# Patient Record
Sex: Male | Born: 1945 | Race: White | Hispanic: No | Marital: Married | Smoking: Never smoker
Health system: Southern US, Community
[De-identification: ages and names within clinical notes are randomized; demographics above are authoritative.]

## PROBLEM LIST (undated history)

## (undated) ENCOUNTER — Encounter

## (undated) ENCOUNTER — Ambulatory Visit: Payer: MEDICARE

## (undated) ENCOUNTER — Ambulatory Visit
Payer: MEDICARE | Attending: Student in an Organized Health Care Education/Training Program | Primary: Student in an Organized Health Care Education/Training Program

## (undated) ENCOUNTER — Telehealth

## (undated) ENCOUNTER — Ambulatory Visit

## (undated) ENCOUNTER — Encounter: Attending: Nephrology | Primary: Nephrology

## (undated) ENCOUNTER — Encounter
Attending: Student in an Organized Health Care Education/Training Program | Primary: Student in an Organized Health Care Education/Training Program

## (undated) ENCOUNTER — Ambulatory Visit: Payer: MEDICARE | Attending: Internal Medicine | Primary: Internal Medicine

## (undated) ENCOUNTER — Telehealth: Attending: Family Medicine | Primary: Family Medicine

## (undated) ENCOUNTER — Encounter: Attending: Family | Primary: Family

## (undated) ENCOUNTER — Ambulatory Visit: Payer: MEDICARE | Attending: Nephrology | Primary: Nephrology

## (undated) ENCOUNTER — Encounter: Attending: Diagnostic Radiology | Primary: Diagnostic Radiology

## (undated) ENCOUNTER — Telehealth
Attending: Student in an Organized Health Care Education/Training Program | Primary: Student in an Organized Health Care Education/Training Program

## (undated) ENCOUNTER — Telehealth: Attending: Family | Primary: Family

## (undated) DIAGNOSIS — M109 Gout, unspecified: Secondary | ICD-10-CM

## (undated) DIAGNOSIS — K7210 Chronic hepatic failure without coma: Secondary | ICD-10-CM

## (undated) DIAGNOSIS — Z9289 Personal history of other medical treatment: Secondary | ICD-10-CM

## (undated) DIAGNOSIS — M545 Low back pain, unspecified: Secondary | ICD-10-CM

## (undated) DIAGNOSIS — Z87442 Personal history of urinary calculi: Secondary | ICD-10-CM

## (undated) DIAGNOSIS — Z944 Liver transplant status: Secondary | ICD-10-CM

## (undated) DIAGNOSIS — E669 Obesity, unspecified: Secondary | ICD-10-CM

## (undated) DIAGNOSIS — I4891 Unspecified atrial fibrillation: Secondary | ICD-10-CM

## (undated) DIAGNOSIS — K7581 Nonalcoholic steatohepatitis (NASH): Secondary | ICD-10-CM

## (undated) DIAGNOSIS — G8929 Other chronic pain: Secondary | ICD-10-CM

## (undated) DIAGNOSIS — Z940 Kidney transplant status: Secondary | ICD-10-CM

## (undated) DIAGNOSIS — M1990 Unspecified osteoarthritis, unspecified site: Secondary | ICD-10-CM

## (undated) DIAGNOSIS — K7290 Hepatic failure, unspecified without coma: Secondary | ICD-10-CM

## (undated) DIAGNOSIS — N02B9 Other recurrent and persistent immunoglobulin A nephropathy: Secondary | ICD-10-CM

## (undated) DIAGNOSIS — G709 Myoneural disorder, unspecified: Secondary | ICD-10-CM

## (undated) DIAGNOSIS — N028 Recurrent and persistent hematuria with other morphologic changes: Secondary | ICD-10-CM

## (undated) DIAGNOSIS — D126 Benign neoplasm of colon, unspecified: Secondary | ICD-10-CM

## (undated) DIAGNOSIS — N186 End stage renal disease: Secondary | ICD-10-CM

## (undated) HISTORY — PX: AV FISTULA PLACEMENT: SHX1204

## (undated) HISTORY — DX: Chronic hepatic failure without coma: K72.10

## (undated) HISTORY — PX: COLONOSCOPY: SHX174

## (undated) HISTORY — PX: UMBILICAL HERNIA REPAIR: SHX196

## (undated) HISTORY — DX: Other recurrent and persistent immunoglobulin A nephropathy: N02.B9

## (undated) HISTORY — PX: REFRACTIVE SURGERY: SHX103

## (undated) HISTORY — PX: LIGATION OF ARTERIOVENOUS  FISTULA: SHX5948

## (undated) HISTORY — PX: BACK SURGERY: SHX140

## (undated) HISTORY — DX: Nonalcoholic steatohepatitis (NASH): K75.81

## (undated) HISTORY — PX: LASIK: SHX215

## (undated) HISTORY — DX: Recurrent and persistent hematuria with other morphologic changes: N02.8

## (undated) HISTORY — PX: HERNIA REPAIR: SHX51

## (undated) HISTORY — PX: OTHER SURGICAL HISTORY: SHX169

## (undated) HISTORY — PX: CARPAL TUNNEL RELEASE: SHX101

---
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
## 2011-02-26 ENCOUNTER — Ambulatory Visit (INDEPENDENT_AMBULATORY_CARE_PROVIDER_SITE_OTHER): Payer: BC Managed Care – PPO | Admitting: Gastroenterology

## 2011-02-26 DIAGNOSIS — K7460 Unspecified cirrhosis of liver: Secondary | ICD-10-CM

## 2011-02-26 DIAGNOSIS — K766 Portal hypertension: Secondary | ICD-10-CM

## 2011-02-26 DIAGNOSIS — R188 Other ascites: Secondary | ICD-10-CM

## 2011-07-30 ENCOUNTER — Ambulatory Visit (INDEPENDENT_AMBULATORY_CARE_PROVIDER_SITE_OTHER): Payer: BC Managed Care – PPO | Admitting: Gastroenterology

## 2011-07-30 DIAGNOSIS — K766 Portal hypertension: Secondary | ICD-10-CM

## 2011-07-30 DIAGNOSIS — K7689 Other specified diseases of liver: Secondary | ICD-10-CM

## 2011-07-30 DIAGNOSIS — R945 Abnormal results of liver function studies: Secondary | ICD-10-CM

## 2011-08-21 ENCOUNTER — Other Ambulatory Visit: Payer: Self-pay | Admitting: Gastroenterology

## 2011-08-21 DIAGNOSIS — N179 Acute kidney failure, unspecified: Secondary | ICD-10-CM

## 2011-08-21 DIAGNOSIS — K7460 Unspecified cirrhosis of liver: Secondary | ICD-10-CM

## 2011-08-24 ENCOUNTER — Ambulatory Visit (HOSPITAL_COMMUNITY)
Admission: RE | Admit: 2011-08-24 | Discharge: 2011-08-24 | Disposition: A | Discharge status: Home / Self Care | Payer: BC Managed Care – PPO | Source: Ambulatory Visit | Attending: Gastroenterology | Admitting: Gastroenterology

## 2011-08-24 DIAGNOSIS — N179 Acute kidney failure, unspecified: Secondary | ICD-10-CM

## 2011-08-24 DIAGNOSIS — R188 Other ascites: Secondary | ICD-10-CM | POA: Insufficient documentation

## 2011-08-24 DIAGNOSIS — K7460 Unspecified cirrhosis of liver: Secondary | ICD-10-CM

## 2011-08-24 MED ORDER — ALBUMIN HUMAN 25 % IV SOLN
50.0000 g | Freq: Once | INTRAVENOUS | Status: AC
  Administered 2011-08-24: 50 g via INTRAVENOUS
  Filled 2011-08-24: qty 200

## 2011-08-24 NOTE — Procedures (Signed)
US guided LLQ  Paracentesis  6 Liters yellow fluid removed  Pt tolerated well BP: stable  Post procedure IV albumin per MD Pt to [facility] now for albumin

## 2011-08-27 ENCOUNTER — Telehealth (HOSPITAL_COMMUNITY): Payer: Self-pay

## 2011-09-04 ENCOUNTER — Ambulatory Visit (HOSPITAL_COMMUNITY)
Admission: AD | Admit: 2011-09-04 | Discharge: 2011-09-04 | Disposition: A | Discharge status: Home / Self Care | Payer: BC Managed Care – PPO | Source: Ambulatory Visit | Attending: Psychiatry | Admitting: Psychiatry

## 2011-09-25 ENCOUNTER — Other Ambulatory Visit: Payer: Self-pay | Admitting: Gastroenterology

## 2011-09-25 DIAGNOSIS — K7460 Unspecified cirrhosis of liver: Secondary | ICD-10-CM

## 2011-09-26 ENCOUNTER — Ambulatory Visit (HOSPITAL_COMMUNITY)
Admission: RE | Admit: 2011-09-26 | Discharge: 2011-09-26 | Disposition: A | Discharge status: Home / Self Care | Payer: Medicare Other | Source: Ambulatory Visit | Attending: Gastroenterology | Admitting: Gastroenterology

## 2011-09-26 VITALS — BP 110/65

## 2011-09-26 DIAGNOSIS — K7460 Unspecified cirrhosis of liver: Secondary | ICD-10-CM

## 2011-09-26 DIAGNOSIS — R188 Other ascites: Secondary | ICD-10-CM | POA: Insufficient documentation

## 2011-09-26 MED ORDER — ALBUMIN HUMAN 25 % IV SOLN
25.0000 g | Freq: Once | INTRAVENOUS | Status: AC
  Administered 2011-09-26: 25 g via INTRAVENOUS
  Filled 2011-09-26: qty 100

## 2011-09-26 NOTE — Procedures (Signed)
US guided diagnostic and therapeutic paracentesis performed yielding 5 liters yellow fluid. A portion of the fluid was sent for culture and sensitivity. No immediate complications.

## 2011-09-30 ENCOUNTER — Telehealth (HOSPITAL_COMMUNITY): Payer: Self-pay | Admitting: Radiology

## 2011-09-30 LAB — BODY FLUID CULTURE

## 2011-10-01 ENCOUNTER — Telehealth (HOSPITAL_COMMUNITY): Payer: Self-pay

## 2011-10-11 ENCOUNTER — Other Ambulatory Visit: Payer: Self-pay | Admitting: Gastroenterology

## 2011-10-11 DIAGNOSIS — K7460 Unspecified cirrhosis of liver: Secondary | ICD-10-CM

## 2011-10-17 ENCOUNTER — Ambulatory Visit (HOSPITAL_COMMUNITY)
Admission: RE | Admit: 2011-10-17 | Discharge: 2011-10-17 | Disposition: A | Discharge status: Home / Self Care | Payer: Medicare Other | Source: Ambulatory Visit | Attending: Gastroenterology | Admitting: Gastroenterology

## 2011-10-17 DIAGNOSIS — R188 Other ascites: Secondary | ICD-10-CM | POA: Insufficient documentation

## 2011-10-17 DIAGNOSIS — K7460 Unspecified cirrhosis of liver: Secondary | ICD-10-CM

## 2011-10-17 MED ORDER — ALBUMIN HUMAN 25 % IV SOLN
50.0000 g | Freq: Once | INTRAVENOUS | Status: AC
  Administered 2011-10-17: 50 g via INTRAVENOUS
  Filled 2011-10-17: qty 200

## 2011-10-17 NOTE — Procedures (Signed)
  US guided RLQ paracentesis  5 liters removed: yellow 60 cc sent to lab  BP stable Pt tolerated well

## 2011-10-21 LAB — BODY FLUID CULTURE: Culture: NO GROWTH

## 2011-10-22 ENCOUNTER — Ambulatory Visit (INDEPENDENT_AMBULATORY_CARE_PROVIDER_SITE_OTHER): Payer: Medicare Other | Admitting: Gastroenterology

## 2011-10-22 DIAGNOSIS — R945 Abnormal results of liver function studies: Secondary | ICD-10-CM

## 2011-10-22 DIAGNOSIS — K766 Portal hypertension: Secondary | ICD-10-CM

## 2011-10-22 DIAGNOSIS — K7460 Unspecified cirrhosis of liver: Secondary | ICD-10-CM

## 2011-10-22 DIAGNOSIS — R188 Other ascites: Secondary | ICD-10-CM

## 2011-11-07 ENCOUNTER — Other Ambulatory Visit: Payer: Self-pay | Admitting: Gastroenterology

## 2011-11-07 DIAGNOSIS — K7290 Hepatic failure, unspecified without coma: Secondary | ICD-10-CM

## 2011-11-12 ENCOUNTER — Inpatient Hospital Stay (HOSPITAL_COMMUNITY)
Admission: RE | Admit: 2011-11-12 | Discharge: 2011-11-12 | Payer: Self-pay | Source: Ambulatory Visit | Attending: Radiology | Admitting: Radiology

## 2011-11-12 ENCOUNTER — Ambulatory Visit (HOSPITAL_COMMUNITY)
Admission: RE | Admit: 2011-11-12 | Discharge: 2011-11-12 | Disposition: A | Discharge status: Home / Self Care | Payer: Medicare Other | Source: Ambulatory Visit | Attending: Gastroenterology | Admitting: Gastroenterology

## 2011-11-12 VITALS — BP 112/56

## 2011-11-12 DIAGNOSIS — K7290 Hepatic failure, unspecified without coma: Secondary | ICD-10-CM

## 2011-11-12 DIAGNOSIS — K7460 Unspecified cirrhosis of liver: Secondary | ICD-10-CM | POA: Insufficient documentation

## 2011-11-12 DIAGNOSIS — R188 Other ascites: Secondary | ICD-10-CM | POA: Insufficient documentation

## 2011-11-12 MED ORDER — ALBUMIN HUMAN 25 % IV SOLN
50.0000 g | Freq: Once | INTRAVENOUS | Status: AC
  Administered 2011-11-12: 50 g via INTRAVENOUS
  Filled 2011-11-12: qty 200

## 2011-11-12 NOTE — Procedures (Signed)
RLQ US guided paracentesis  6 liters yellow fluid removed Pt tolerated well BP stable: 115/56  IV albumin post procedure per MD

## 2011-12-12 ENCOUNTER — Other Ambulatory Visit: Payer: Self-pay | Admitting: Gastroenterology

## 2011-12-12 DIAGNOSIS — R188 Other ascites: Secondary | ICD-10-CM

## 2011-12-12 DIAGNOSIS — K7210 Chronic hepatic failure without coma: Secondary | ICD-10-CM

## 2011-12-12 DIAGNOSIS — N186 End stage renal disease: Secondary | ICD-10-CM

## 2011-12-12 DIAGNOSIS — K7290 Hepatic failure, unspecified without coma: Secondary | ICD-10-CM

## 2011-12-17 ENCOUNTER — Ambulatory Visit (HOSPITAL_COMMUNITY)
Admission: RE | Admit: 2011-12-17 | Discharge: 2011-12-17 | Disposition: A | Discharge status: Home / Self Care | Payer: Medicare Other | Source: Ambulatory Visit | Attending: Gastroenterology | Admitting: Gastroenterology

## 2011-12-17 DIAGNOSIS — K7290 Hepatic failure, unspecified without coma: Secondary | ICD-10-CM

## 2011-12-17 DIAGNOSIS — N186 End stage renal disease: Secondary | ICD-10-CM | POA: Insufficient documentation

## 2011-12-17 DIAGNOSIS — R188 Other ascites: Secondary | ICD-10-CM | POA: Insufficient documentation

## 2011-12-17 DIAGNOSIS — K769 Liver disease, unspecified: Secondary | ICD-10-CM | POA: Insufficient documentation

## 2011-12-17 MED ORDER — ALBUMIN HUMAN 25 % IV SOLN
50.0000 g | Freq: Once | INTRAVENOUS | Status: AC
  Administered 2011-12-17: 50 g via INTRAVENOUS
  Filled 2011-12-17: qty 200

## 2011-12-17 NOTE — Procedures (Signed)
Procedure : large volume paracentesis Specimen :  7.6  L amber colored fluid Complications : none immediate  Patient tolerated well.  Patient sent for albumin infusion post procedure .

## 2012-01-10 ENCOUNTER — Other Ambulatory Visit: Payer: Self-pay | Admitting: Gastroenterology

## 2012-01-10 DIAGNOSIS — K7460 Unspecified cirrhosis of liver: Secondary | ICD-10-CM

## 2012-01-15 ENCOUNTER — Other Ambulatory Visit (HOSPITAL_COMMUNITY): Payer: Self-pay

## 2012-01-15 ENCOUNTER — Ambulatory Visit (HOSPITAL_COMMUNITY)
Admission: RE | Admit: 2012-01-15 | Discharge: 2012-01-15 | Disposition: A | Discharge status: Home / Self Care | Payer: Medicare Other | Source: Ambulatory Visit | Attending: Gastroenterology | Admitting: Gastroenterology

## 2012-01-15 DIAGNOSIS — K7460 Unspecified cirrhosis of liver: Secondary | ICD-10-CM | POA: Insufficient documentation

## 2012-01-15 DIAGNOSIS — K8020 Calculus of gallbladder without cholecystitis without obstruction: Secondary | ICD-10-CM | POA: Insufficient documentation

## 2012-01-15 DIAGNOSIS — R188 Other ascites: Secondary | ICD-10-CM | POA: Insufficient documentation

## 2012-01-15 DIAGNOSIS — R161 Splenomegaly, not elsewhere classified: Secondary | ICD-10-CM | POA: Insufficient documentation

## 2012-01-16 ENCOUNTER — Other Ambulatory Visit: Payer: Self-pay | Admitting: Gastroenterology

## 2012-01-16 DIAGNOSIS — K7290 Hepatic failure, unspecified without coma: Secondary | ICD-10-CM

## 2012-01-16 DIAGNOSIS — K7210 Chronic hepatic failure without coma: Secondary | ICD-10-CM

## 2012-01-16 DIAGNOSIS — N186 End stage renal disease: Secondary | ICD-10-CM

## 2012-01-21 ENCOUNTER — Ambulatory Visit (HOSPITAL_COMMUNITY)
Admission: RE | Admit: 2012-01-21 | Discharge: 2012-01-21 | Disposition: A | Discharge status: Home / Self Care | Payer: Medicare Other | Source: Ambulatory Visit | Attending: Gastroenterology | Admitting: Gastroenterology

## 2012-01-21 DIAGNOSIS — R188 Other ascites: Secondary | ICD-10-CM | POA: Insufficient documentation

## 2012-01-21 DIAGNOSIS — K7210 Chronic hepatic failure without coma: Secondary | ICD-10-CM

## 2012-01-21 DIAGNOSIS — K7290 Hepatic failure, unspecified without coma: Secondary | ICD-10-CM

## 2012-01-21 DIAGNOSIS — N186 End stage renal disease: Secondary | ICD-10-CM

## 2012-01-21 MED ORDER — ALBUMIN HUMAN 25 % IV SOLN
75.0000 g | Freq: Once | INTRAVENOUS | Status: AC
  Administered 2012-01-21: 75 g via INTRAVENOUS
  Filled 2012-01-21: qty 300

## 2012-02-18 ENCOUNTER — Other Ambulatory Visit: Payer: Self-pay | Admitting: Gastroenterology

## 2012-02-18 DIAGNOSIS — K769 Liver disease, unspecified: Secondary | ICD-10-CM

## 2012-02-20 ENCOUNTER — Ambulatory Visit (HOSPITAL_COMMUNITY)
Admission: RE | Admit: 2012-02-20 | Discharge: 2012-02-20 | Disposition: A | Discharge status: Home / Self Care | Payer: Medicare Other | Source: Ambulatory Visit | Attending: Gastroenterology | Admitting: Gastroenterology

## 2012-02-20 DIAGNOSIS — K7460 Unspecified cirrhosis of liver: Secondary | ICD-10-CM | POA: Insufficient documentation

## 2012-02-20 DIAGNOSIS — R188 Other ascites: Secondary | ICD-10-CM | POA: Insufficient documentation

## 2012-02-20 DIAGNOSIS — K769 Liver disease, unspecified: Secondary | ICD-10-CM

## 2012-02-20 MED ORDER — ALBUMIN HUMAN 25 % IV SOLN
25.0000 g | Freq: Once | INTRAVENOUS | Status: DC
  Administered 2012-02-20: 25 g via INTRAVENOUS
  Filled 2012-02-20 (×2): qty 100

## 2012-02-20 MED ORDER — ALBUMIN HUMAN 25 % IV SOLN
25.0000 g | INTRAVENOUS | Status: AC
  Administered 2012-02-20 (×2): 25 g via INTRAVENOUS
  Filled 2012-02-20: qty 100

## 2012-02-20 NOTE — Procedures (Signed)
US guided therapeutic paracentesis performed yielding 10 liters yellow fluid. No immediate complications. The pt will receive IV albumin postprocedure.

## 2012-03-10 ENCOUNTER — Other Ambulatory Visit: Payer: Self-pay | Admitting: Gastroenterology

## 2012-03-10 DIAGNOSIS — R188 Other ascites: Secondary | ICD-10-CM

## 2012-03-17 ENCOUNTER — Ambulatory Visit (HOSPITAL_COMMUNITY)
Admission: RE | Admit: 2012-03-17 | Discharge: 2012-03-17 | Disposition: A | Discharge status: Home / Self Care | Payer: Medicare Other | Source: Ambulatory Visit | Attending: Gastroenterology | Admitting: Gastroenterology

## 2012-03-17 DIAGNOSIS — R188 Other ascites: Secondary | ICD-10-CM | POA: Insufficient documentation

## 2012-03-17 MED ORDER — ALBUMIN HUMAN 25 % IV SOLN
75.0000 g | Freq: Once | INTRAVENOUS | Status: AC
  Administered 2012-03-17: 75 g via INTRAVENOUS
  Filled 2012-03-17: qty 300

## 2012-03-17 NOTE — Procedures (Signed)
RLQ US guided paracentesis 10.6 liters obtained Pt tolerated well BP stable: 105/59  IV albumin post procedure per MD 25 gr of 25% Albumin/ 3 liters drawn = 75 gr IV Albumin Pt to [facility] for albumin now.

## 2012-04-07 ENCOUNTER — Other Ambulatory Visit: Payer: Self-pay | Admitting: Gastroenterology

## 2012-04-07 DIAGNOSIS — R188 Other ascites: Secondary | ICD-10-CM

## 2012-04-11 ENCOUNTER — Ambulatory Visit (HOSPITAL_COMMUNITY)
Admission: RE | Admit: 2012-04-11 | Discharge: 2012-04-11 | Disposition: A | Discharge status: Home / Self Care | Payer: Medicare Other | Source: Ambulatory Visit | Attending: Gastroenterology | Admitting: Gastroenterology

## 2012-04-11 DIAGNOSIS — R188 Other ascites: Secondary | ICD-10-CM | POA: Insufficient documentation

## 2012-04-11 MED ORDER — ALBUMIN HUMAN 25 % IV SOLN
100.0000 g | Freq: Once | INTRAVENOUS | Status: AC
  Administered 2012-04-11: 100 g via INTRAVENOUS
  Filled 2012-04-11: qty 400

## 2012-04-11 NOTE — Procedures (Addendum)
Procedure : large volume paracentesis Specimen : 13.3   L yellow serous fluid Complications : none immediate  Patient to [facility] for IV albumin post procedure as ordered. Full report in canopy.

## 2012-04-25 ENCOUNTER — Other Ambulatory Visit: Payer: Self-pay | Admitting: Gastroenterology

## 2012-04-25 DIAGNOSIS — R188 Other ascites: Secondary | ICD-10-CM

## 2012-04-25 DIAGNOSIS — K7290 Hepatic failure, unspecified without coma: Secondary | ICD-10-CM

## 2012-04-28 ENCOUNTER — Ambulatory Visit (HOSPITAL_COMMUNITY)
Admission: RE | Admit: 2012-04-28 | Discharge: 2012-04-28 | Disposition: A | Discharge status: Home / Self Care | Payer: Medicare Other | Source: Ambulatory Visit | Attending: Gastroenterology | Admitting: Gastroenterology

## 2012-04-28 DIAGNOSIS — K7290 Hepatic failure, unspecified without coma: Secondary | ICD-10-CM

## 2012-04-28 DIAGNOSIS — R188 Other ascites: Secondary | ICD-10-CM | POA: Insufficient documentation

## 2012-04-28 MED ORDER — ALBUMIN HUMAN 25 % IV SOLN
75.0000 g | Freq: Once | INTRAVENOUS | Status: AC
  Administered 2012-04-28: 75 g via INTRAVENOUS
  Filled 2012-04-28 (×2): qty 300

## 2012-04-28 NOTE — Procedures (Addendum)
Successful US guided paracentesis from RLQ.  Yielded 11.5L of clear yellow fluid.  No immediate complications.  Pt tolerated well.   Specimen was not sent for labs. Pt to go to [facility] for IV albumin Based on volume, will give 75gr IV albumin today  Brayton El PA-C 04/28/2012 12:52 PM

## 2012-05-12 ENCOUNTER — Other Ambulatory Visit: Payer: Self-pay | Admitting: Gastroenterology

## 2012-05-12 DIAGNOSIS — K7460 Unspecified cirrhosis of liver: Secondary | ICD-10-CM

## 2012-05-19 ENCOUNTER — Ambulatory Visit (HOSPITAL_COMMUNITY)
Admission: RE | Admit: 2012-05-19 | Discharge: 2012-05-19 | Disposition: A | Discharge status: Home / Self Care | Payer: Medicare Other | Source: Ambulatory Visit | Attending: Gastroenterology | Admitting: Gastroenterology

## 2012-05-19 DIAGNOSIS — K7460 Unspecified cirrhosis of liver: Secondary | ICD-10-CM | POA: Insufficient documentation

## 2012-05-19 MED ORDER — ALBUMIN HUMAN 25 % IV SOLN
100.0000 g | Freq: Once | INTRAVENOUS | Status: AC
  Administered 2012-05-19: 100 g via INTRAVENOUS
  Filled 2012-05-19 (×3): qty 400

## 2012-05-19 NOTE — Procedures (Signed)
Successful US guided paracentesis from RLQ.  Yielded 11.5L of clear yellow fluid.  No immediate complications.  Pt tolerated well.   Specimen was not sent for labs.  Brayton El PA-C 05/19/2012 12:07 PM

## 2012-06-09 ENCOUNTER — Other Ambulatory Visit: Payer: Self-pay | Admitting: Gastroenterology

## 2012-06-09 DIAGNOSIS — K7460 Unspecified cirrhosis of liver: Secondary | ICD-10-CM

## 2012-06-09 DIAGNOSIS — R188 Other ascites: Secondary | ICD-10-CM

## 2012-06-09 DIAGNOSIS — Z7682 Awaiting organ transplant status: Secondary | ICD-10-CM

## 2012-06-18 ENCOUNTER — Ambulatory Visit (HOSPITAL_COMMUNITY)
Admission: RE | Admit: 2012-06-18 | Discharge: 2012-06-18 | Disposition: A | Discharge status: Home / Self Care | Payer: Medicare Other | Source: Ambulatory Visit | Attending: Gastroenterology | Admitting: Gastroenterology

## 2012-06-18 ENCOUNTER — Other Ambulatory Visit (HOSPITAL_COMMUNITY): Payer: Self-pay

## 2012-06-18 DIAGNOSIS — R188 Other ascites: Secondary | ICD-10-CM | POA: Insufficient documentation

## 2012-06-18 DIAGNOSIS — Z7682 Awaiting organ transplant status: Secondary | ICD-10-CM | POA: Insufficient documentation

## 2012-06-18 DIAGNOSIS — K7460 Unspecified cirrhosis of liver: Secondary | ICD-10-CM | POA: Insufficient documentation

## 2012-06-18 MED ORDER — ALBUMIN HUMAN 25 % IV SOLN
80.0000 g | Freq: Once | INTRAVENOUS | Status: AC
  Administered 2012-06-18: 80 g via INTRAVENOUS
  Filled 2012-06-18: qty 350

## 2012-06-18 NOTE — Procedures (Signed)
Successful US guided paracentesis from RLQ.  Yielded 10.2L of clear yellow fluid.  No immediate complications.  Pt tolerated well.   Specimen was not sent for labs.  Brayton El PA-C 06/18/2012 10:58 AM

## 2012-07-02 DIAGNOSIS — N186 End stage renal disease: Secondary | ICD-10-CM

## 2012-07-02 HISTORY — PX: LIVER TRANSPLANT: SHX410

## 2012-07-02 HISTORY — PX: CHOLECYSTECTOMY OPEN: SUR202

## 2012-07-02 HISTORY — DX: End stage renal disease: N18.6

## 2012-07-02 HISTORY — PX: NEPHRECTOMY RECIPIENT: SUR879

## 2012-07-10 ENCOUNTER — Other Ambulatory Visit: Payer: Self-pay | Admitting: Gastroenterology

## 2012-07-10 DIAGNOSIS — R188 Other ascites: Secondary | ICD-10-CM

## 2012-07-11 ENCOUNTER — Ambulatory Visit (HOSPITAL_COMMUNITY)
Admission: RE | Admit: 2012-07-11 | Discharge: 2012-07-11 | Disposition: A | Discharge status: Home / Self Care | Payer: Medicare Other | Source: Ambulatory Visit | Attending: Gastroenterology | Admitting: Gastroenterology

## 2012-07-11 DIAGNOSIS — R188 Other ascites: Secondary | ICD-10-CM

## 2012-07-11 MED ORDER — ALBUMIN HUMAN 25 % IV SOLN
60.0000 g | Freq: Once | INTRAVENOUS | Status: AC
  Administered 2012-07-11: 60 g via INTRAVENOUS
  Filled 2012-07-11: qty 250

## 2012-07-11 NOTE — Procedures (Signed)
Successful US guided paracentesis from RLQ.  Yielded 7.2L of clear yellow fluid.  No immediate complications.  Pt tolerated well.   Specimen was not sent for labs. Pt was sent to Short Stay for IV albumin as ordered  Brayton El PA-C 07/11/2012 11:31 AM

## 2012-10-21 DIAGNOSIS — N200 Calculus of kidney: Secondary | ICD-10-CM | POA: Insufficient documentation

## 2013-10-16 DIAGNOSIS — N186 End stage renal disease: Secondary | ICD-10-CM | POA: Insufficient documentation

## 2013-12-08 IMAGING — US US PARACENTESIS
1 series · 7 of 7 positions shown · non-contrast
Comparison: None

CLINICAL DATA: Abdominal ascites

ULTRASOUND GUIDED PARACENTESIS

[Series 1: us paracentesis · 0.31mm/px · 7 of 7 slices shown]
[im 1/7]
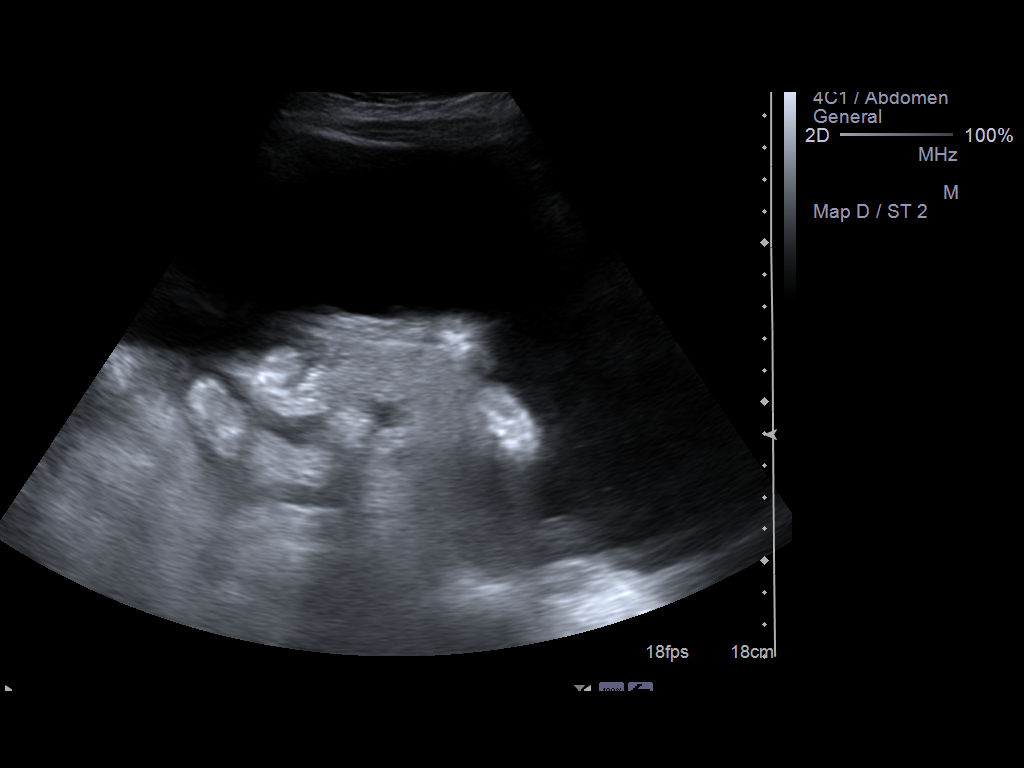
[im 2/7]
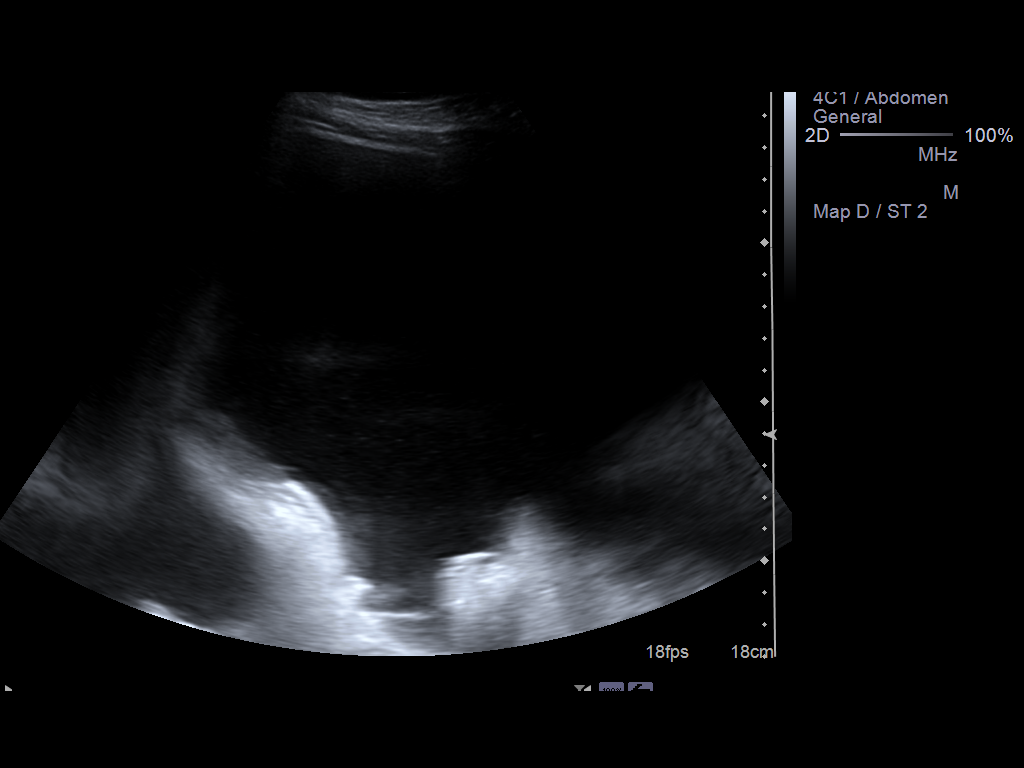
[im 3/7]
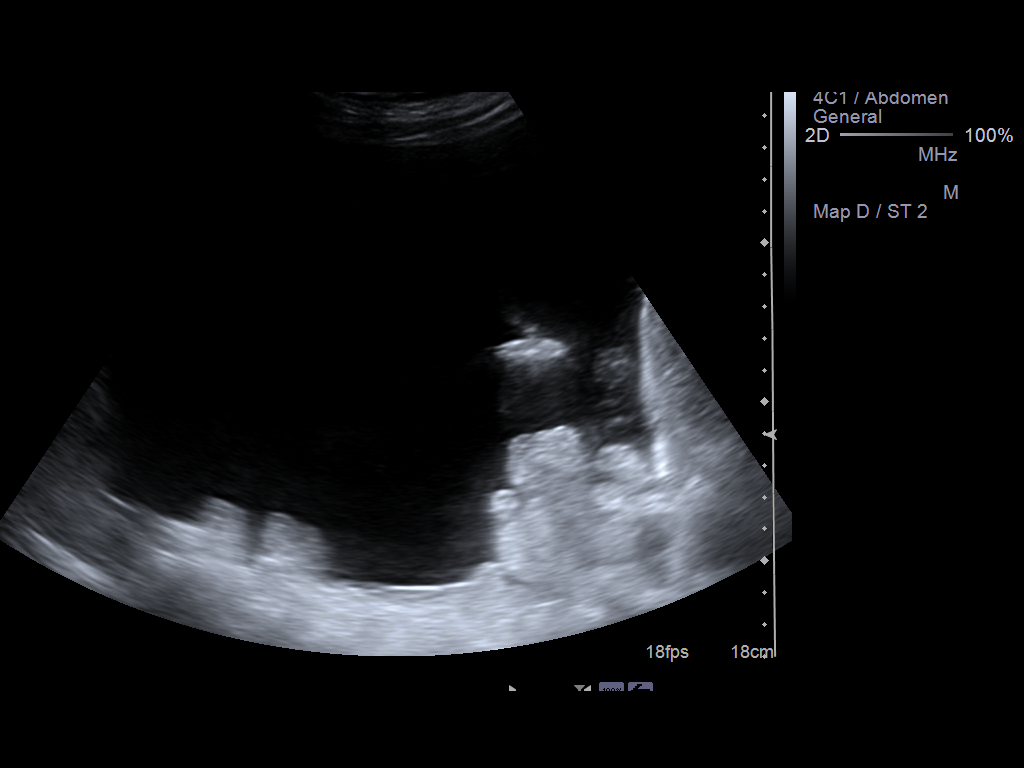
[im 4/7]
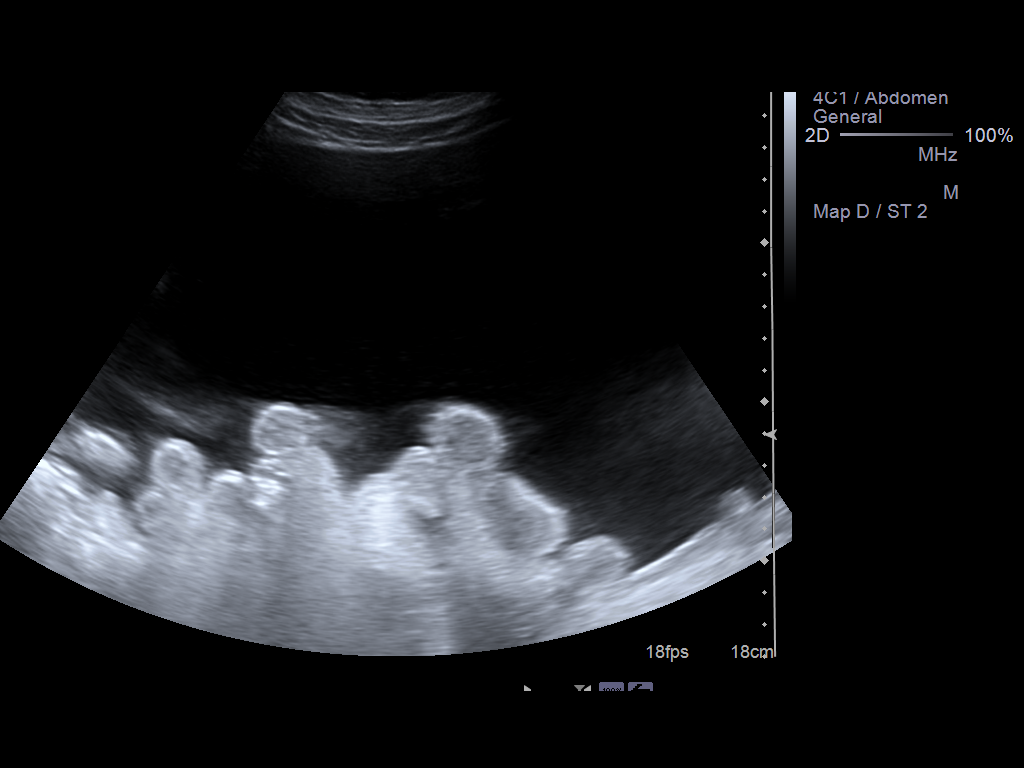
[im 5/7]
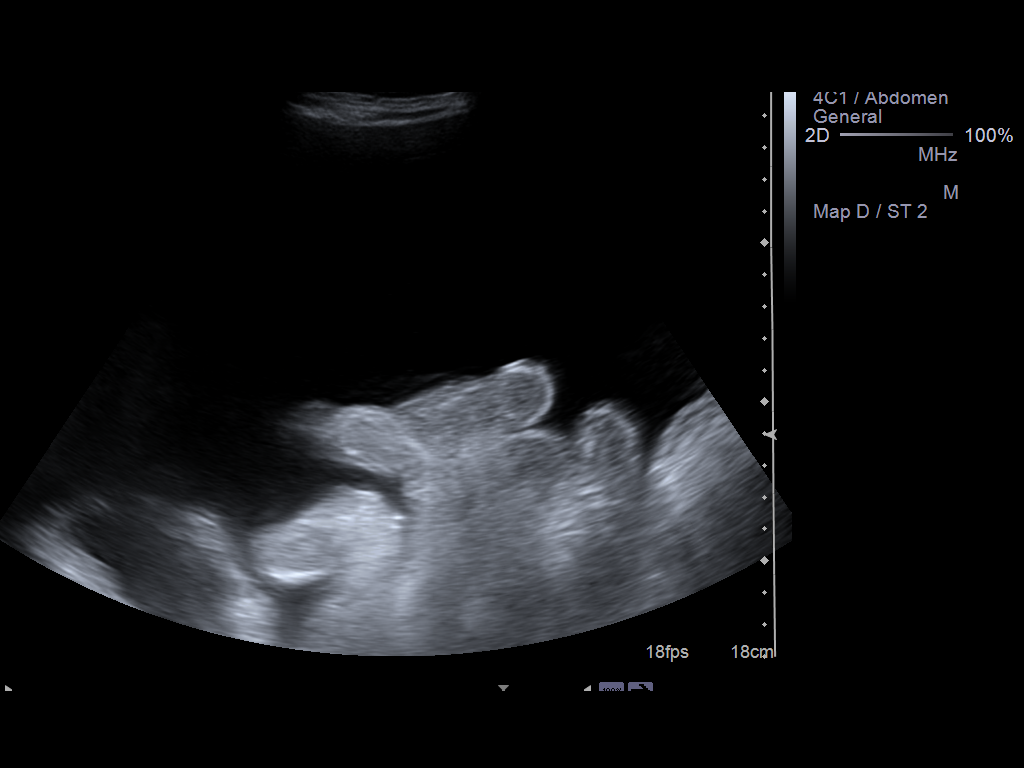
[im 6/7]
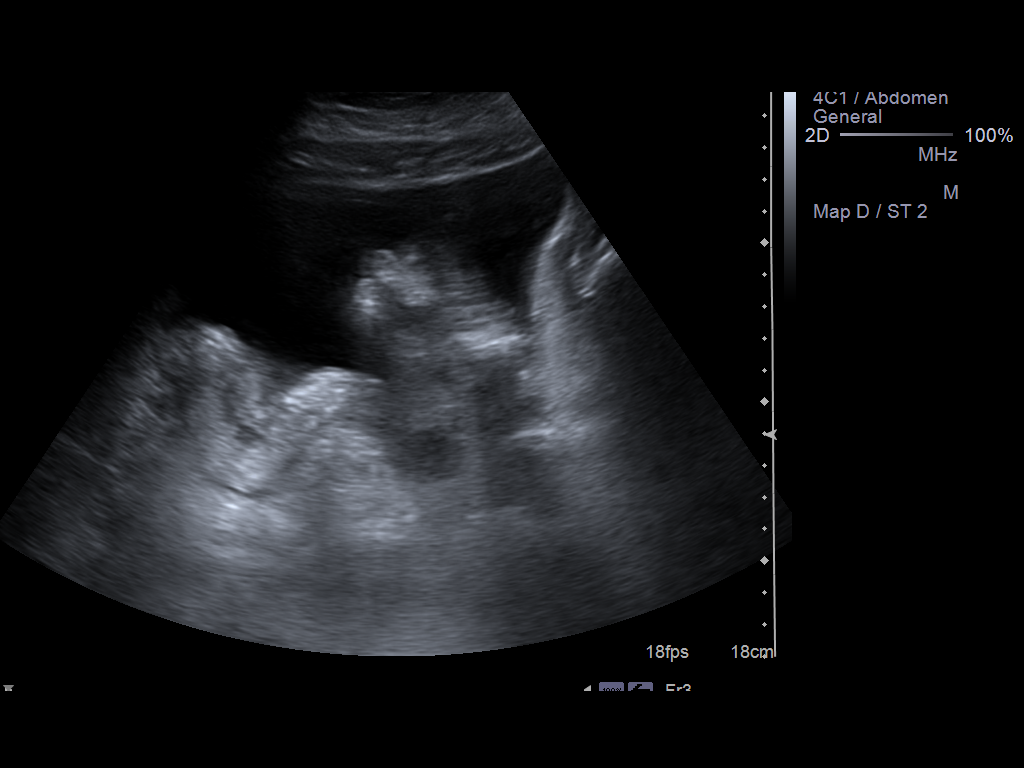
[im 7/7]
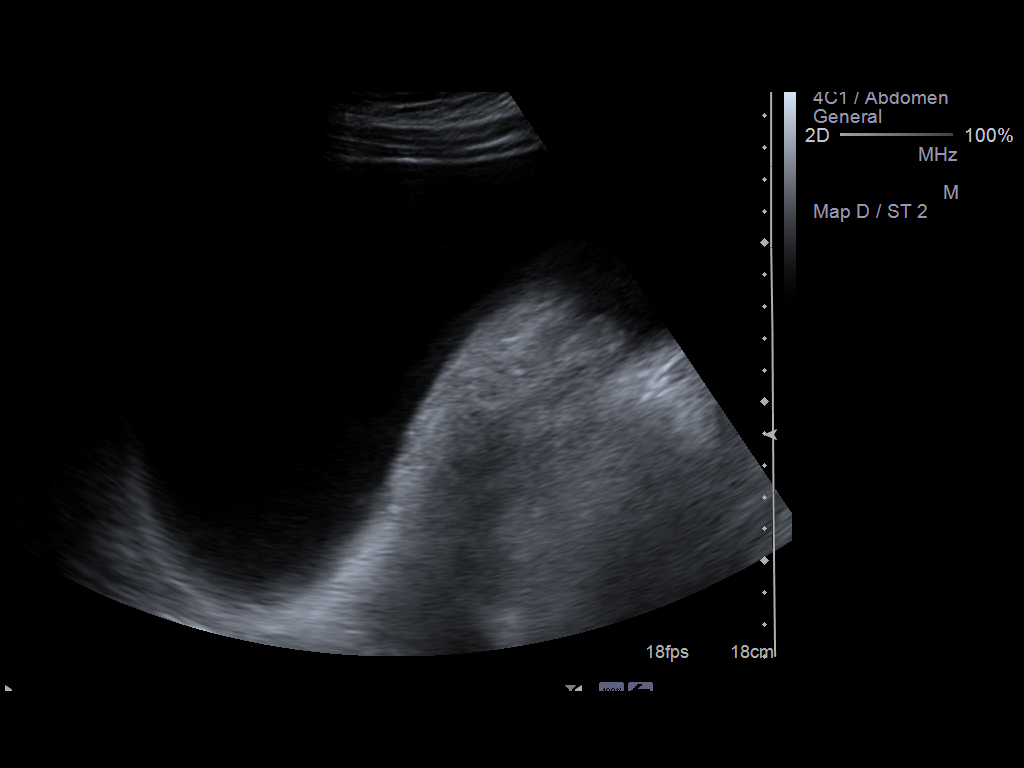

[7 of 7 positions shown; findings below may reference images not displayed]

An ultrasound guided paracentesis was thoroughly discussed with the
patient and questions answered.  The benefits, risks, alternatives
and complications were also discussed.  The patient understands and
wishes to proceed with the procedure.  Written consent was
obtained.

Ultrasound was performed to localize and mark an adequate pocket of
fluid in the left lower quadrant of the abdomen.  The area was then
prepped and draped in the normal sterile fashion.  1% Lidocaine was
used for local anesthesia.  Under ultrasound guidance a 19 gauge
Yueh catheter was introduced.  Paracentesis was performed.  The
catheter was removed and a dressing applied.

Complications:  None
FINDINGS: A total of approximately 6 liters of yellow fluid was
removed.  A fluid sample was not sent for laboratory analysis.
IMPRESSION: Successful ultrasound guided paracentesis yielding 6 liters of
ascites. The patient to go to [REDACTED] following procedure
for IV albumin per his RTOYOTA. Blood pressure stable.

Read by: Erxleben, Ferienhaus.-SAJI

## 2014-01-10 IMAGING — US US PARACENTESIS
1 series · 6 of 6 positions shown · non-contrast
Comparison: none

CLINICAL DATA: Cirrhosis, recurrent ascites; request is made for
diagnostic and therapeutic paracentesis up to 5 liters.

[Series 1: us paracentesis · 0.32mm/px · 6 of 6 slices shown]
[im 1/6]
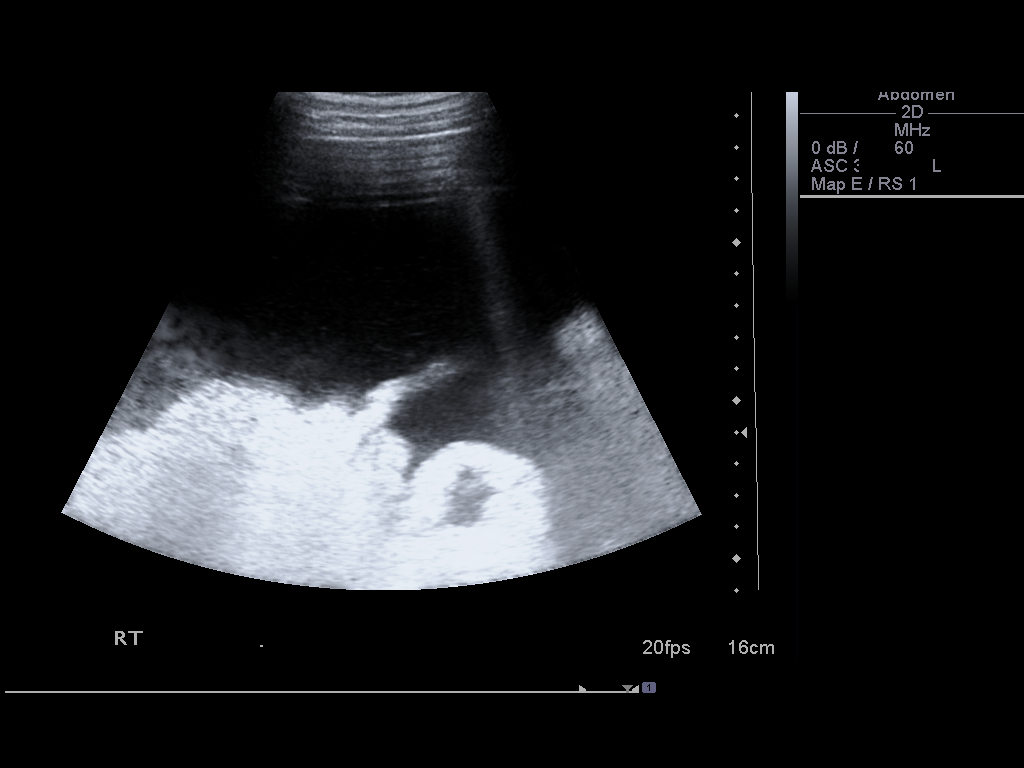
[im 2/6]
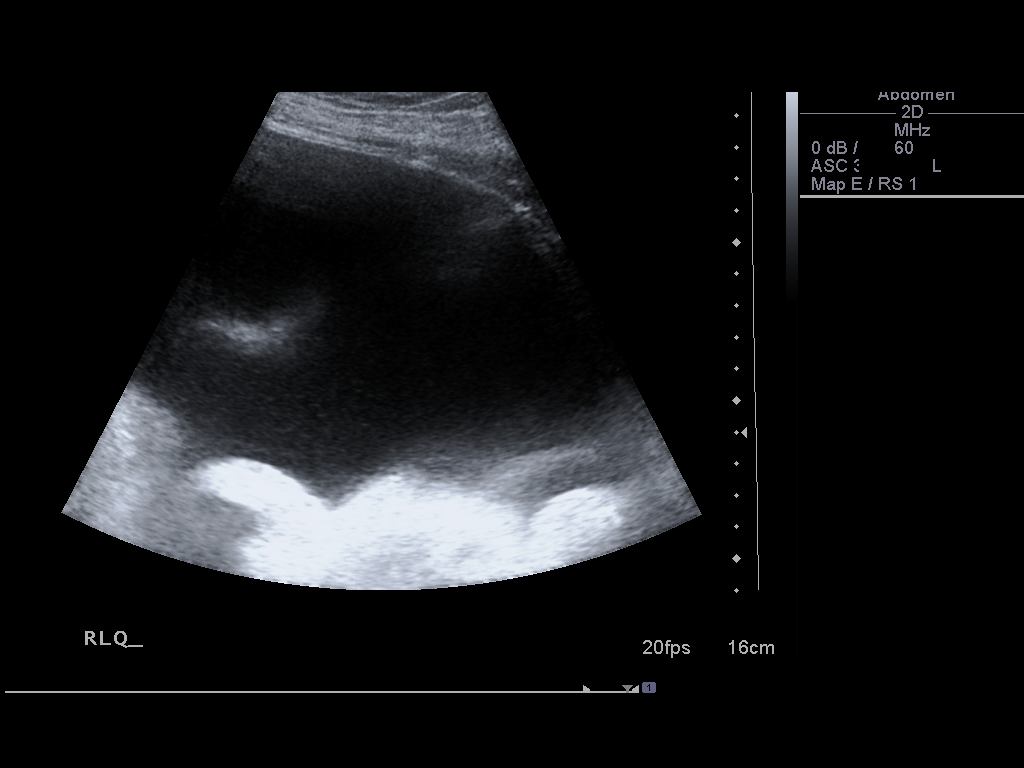
[im 3/6]
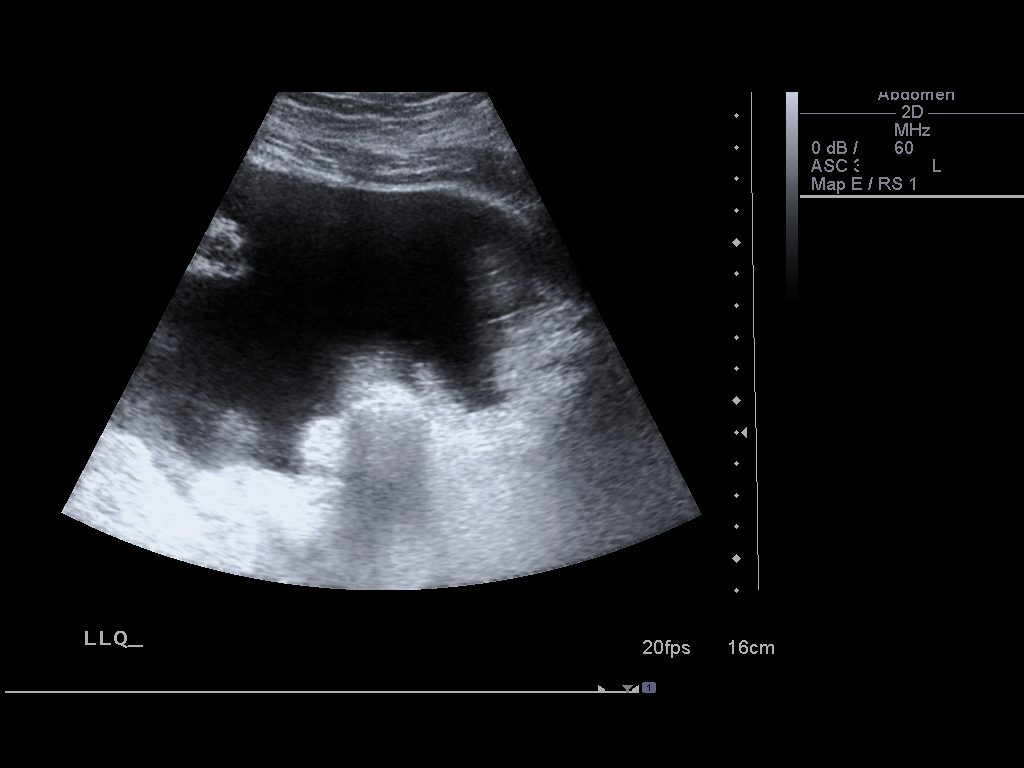
[im 4/6]
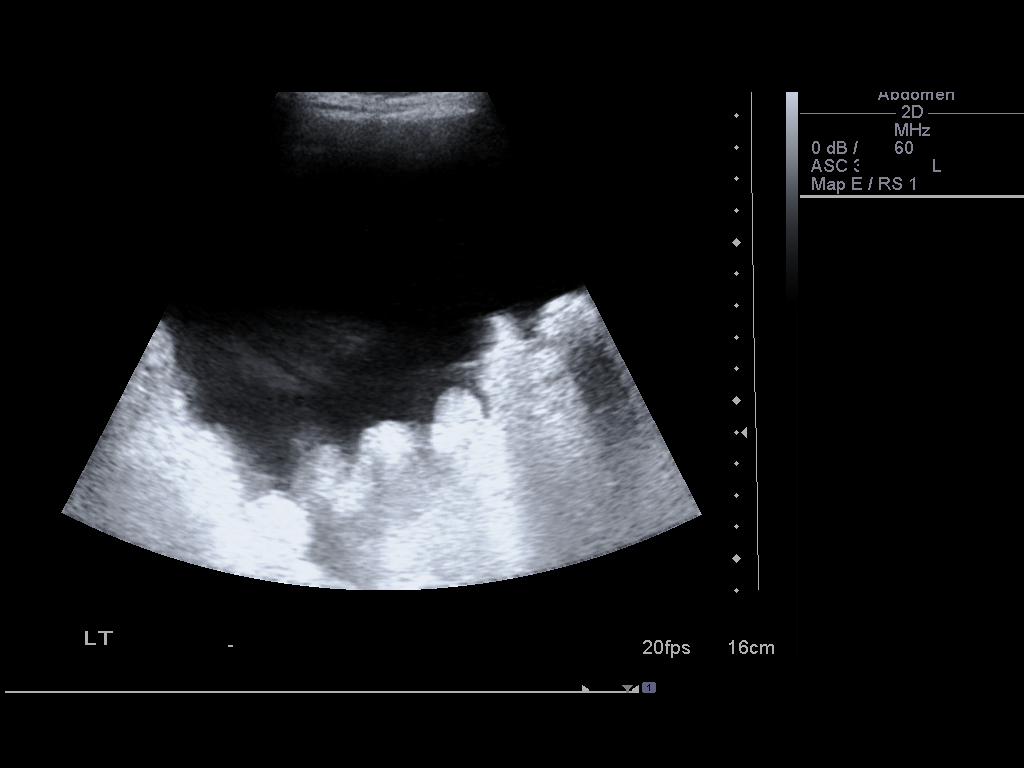
[im 5/6]
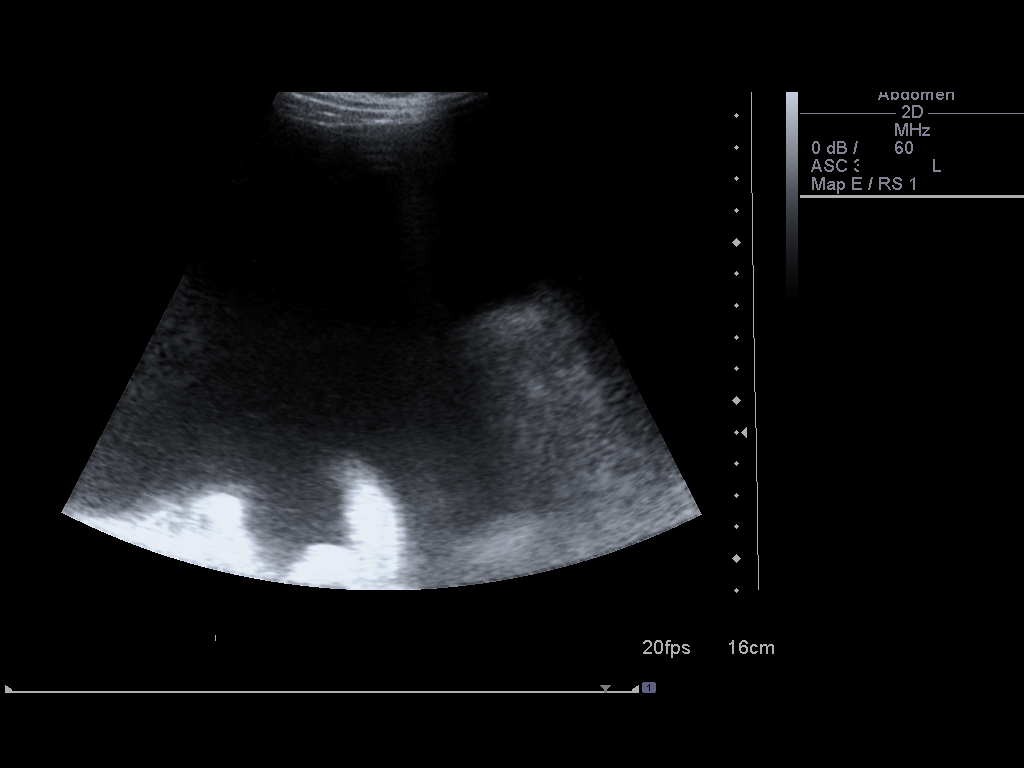
[im 6/6]
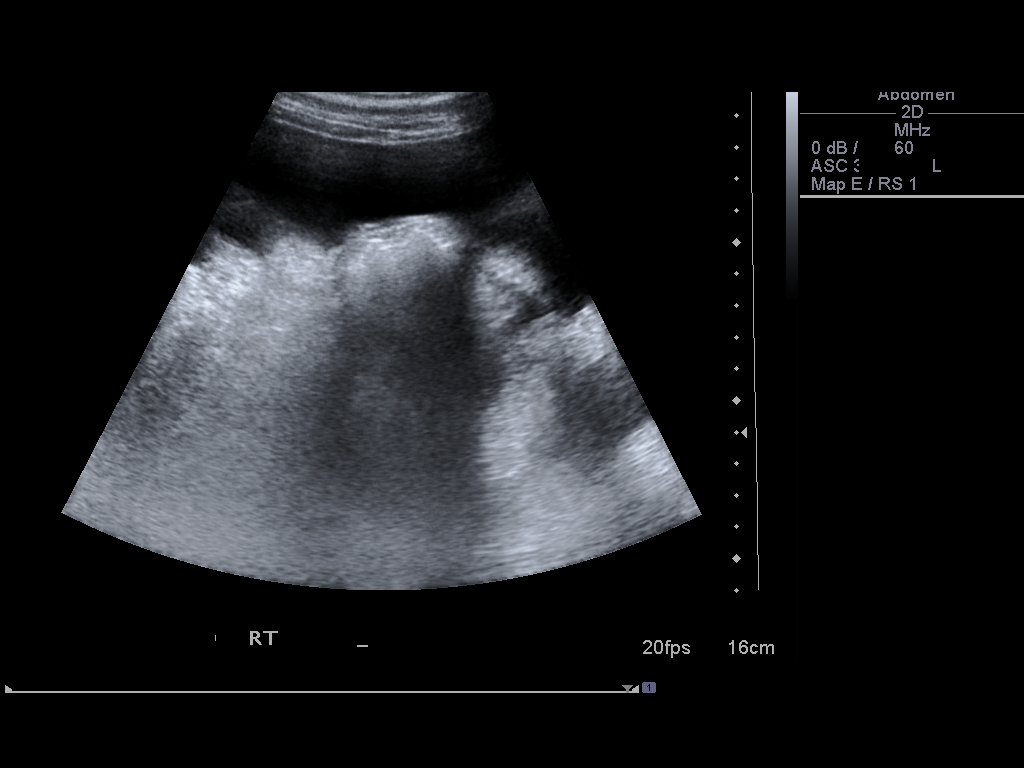

[6 of 6 positions shown; findings below may reference images not displayed]

ULTRASOUND GUIDED DIAGNOSTIC AND THERAPEUTIC PARACENTESIS

An ultrasound guided paracentesis was thoroughly discussed with the
patient and questions answered.  The benefits, risks, alternatives
and complications were also discussed.  The patient understands and
wishes to proceed with the procedure.  Written consent was
obtained.

Ultrasound was performed to localize and mark an adequate pocket of
fluid in the right lower quadrant of the abdomen.  The area was
then prepped and draped in the normal sterile fashion.  1%
Lidocaine was used for local anesthesia.  Under ultrasound guidance
a 19 gauge Yueh catheter was introduced.  Paracentesis was
performed.  The catheter was removed and a dressing applied.

Complications:  none
FINDINGS: A total of approximately 5 liters of yellow fluid was
removed.  A fluid sample was sent for laboratory analysis.
IMPRESSION: Successful ultrasound guided diagnostic and therapeutic
paracentesis yielding 5 liters of ascites. The patient received IV
albumin infusion post procedure.

Read by: Tee, [place].-LULU

## 2015-06-28 DIAGNOSIS — M1A9XX0 Chronic gout, unspecified, without tophus (tophi): Secondary | ICD-10-CM | POA: Insufficient documentation

## 2015-11-18 ENCOUNTER — Ambulatory Visit: Payer: Self-pay | Admitting: Physician Assistant

## 2015-11-21 NOTE — Pre-Procedure Instructions (Signed)
[name] [name]  11/21/2015      [place], [place] - [address] [place] [address] Phone: [phone] Fax: [phone]    Your procedure is scheduled on Thursday, June 1st, 2017  Report to [facility] Admitting at 5:30 A.M.   Call this number if you have problems the morning of surgery:  [phone]   Remember:  Do not eat food or drink liquids after midnight.   Take these medicines the morning of surgery with A SIP OF WATER Mycophenolate (Myfortic), Tacrolimus (Prograft)  7 days prior to surgery, stop taking: Aspirin, NSAIDS, Aleve, Naproxen, Ibuprofen, Advil, Motrin, BC's, Goody's, Fish oil, all herbal medications, and all vitamins.    Do not wear jewelry.  Do not wear lotions, powders, or colognes.  You may NOT wear deodorant.  Men may shave face and neck.  Do not bring valuables to the hospital.   [facility] is not responsible for any belongings or valuables.  Contacts, dentures or bridgework may not be worn into surgery.  Leave your suitcase in the car.  After surgery it may be brought to your room.  For patients admitted to the hospital, discharge time will be determined by your treatment team.  Patients discharged the day of surgery will not be allowed to drive home.    Special instructions:  [place] - Preparing for Surgery  Before surgery, you can play an important role.  Because skin is not sterile, your skin needs to be as free of germs as possible.  You can reduce the number of germs on you skin by washing with CHG (chlorahexidine gluconate) soap before surgery.  CHG is an antiseptic cleaner which kills germs and bonds with the skin to continue killing germs even after washing.  Please DO NOT use if you have an allergy to CHG or antibacterial soaps.  If your skin becomes reddened/irritated stop using the CHG and inform your nurse when you arrive at Short Stay.  Do not shave (including legs and  underarms) for at least 48 hours prior to the first CHG shower.  You may shave your face.  Please follow these instructions carefully:   1.  Shower with CHG Soap the night before surgery and the  morning of Surgery.  2.  If you choose to wash your hair, wash your hair first as usual with your normal shampoo.  3.  After you shampoo, rinse your hair and body thoroughly to remove the Shampoo.  4.  Use CHG as you would any other liquid soap.  You can apply chg directly to the skin and wash gently with scrungie or a clean washcloth.  5.  Apply the CHG Soap to your body ONLY FROM THE NECK DOWN.   Do not use on open wounds or open sores.  Avoid contact with your eyes, ears, mouth and genitals (private parts).  Wash genitals (private parts)       with your normal soap.  6.  Wash thoroughly, paying special attention to the area where your surgery will be performed.  7.  Thoroughly rinse your body with warm water from the neck down.  8.  DO NOT shower/wash with your normal soap after using and rinsing off the CHG Soap.  9.  Pat yourself dry with a clean towel.            10.  Wear clean pajamas.  11.  Place clean sheets on your bed the night of your first shower and do not sleep with pets.  Day of Surgery  Do not apply any lotions/deoderants the morning of surgery.  Please wear clean clothes to the hospital/surgery center.     Please read over the following fact sheets that you were given. Pain Booklet, Coughing and Deep Breathing and Surgical Site Infection Prevention

## 2015-11-22 ENCOUNTER — Encounter (HOSPITAL_COMMUNITY): Payer: Self-pay

## 2015-11-22 ENCOUNTER — Encounter (HOSPITAL_COMMUNITY)
Admission: RE | Admit: 2015-11-22 | Discharge: 2015-11-22 | Disposition: A | Discharge status: Home / Self Care | Payer: Medicare Other | Source: Ambulatory Visit | Attending: Orthopedic Surgery | Admitting: Orthopedic Surgery

## 2015-11-22 DIAGNOSIS — Z940 Kidney transplant status: Secondary | ICD-10-CM | POA: Diagnosis not present

## 2015-11-22 DIAGNOSIS — Z01818 Encounter for other preprocedural examination: Secondary | ICD-10-CM | POA: Diagnosis present

## 2015-11-22 DIAGNOSIS — M5126 Other intervertebral disc displacement, lumbar region: Secondary | ICD-10-CM | POA: Insufficient documentation

## 2015-11-22 DIAGNOSIS — E669 Obesity, unspecified: Secondary | ICD-10-CM | POA: Insufficient documentation

## 2015-11-22 DIAGNOSIS — Z79899 Other long term (current) drug therapy: Secondary | ICD-10-CM | POA: Diagnosis not present

## 2015-11-22 DIAGNOSIS — Z01812 Encounter for preprocedural laboratory examination: Secondary | ICD-10-CM | POA: Diagnosis not present

## 2015-11-22 DIAGNOSIS — Z6841 Body Mass Index (BMI) 40.0 and over, adult: Secondary | ICD-10-CM | POA: Diagnosis not present

## 2015-11-22 DIAGNOSIS — Z944 Liver transplant status: Secondary | ICD-10-CM | POA: Diagnosis not present

## 2015-11-22 HISTORY — DX: Benign neoplasm of colon, unspecified: D12.6

## 2015-11-22 HISTORY — DX: Myoneural disorder, unspecified: G70.9

## 2015-11-22 HISTORY — DX: Unspecified osteoarthritis, unspecified site: M19.90

## 2015-11-22 HISTORY — DX: Obesity, unspecified: E66.9

## 2015-11-22 HISTORY — DX: Kidney transplant status: Z94.0

## 2015-11-22 HISTORY — DX: Gout, unspecified: M10.9

## 2015-11-22 HISTORY — DX: End stage renal disease: N18.6

## 2015-11-22 HISTORY — DX: Liver transplant status: Z94.4

## 2015-11-22 LAB — COMPREHENSIVE METABOLIC PANEL
ALBUMIN: 3.5 g/dL (ref 3.5–5.0)
ALT: 27 U/L (ref 17–63)
AST: 22 U/L (ref 15–41)
Alkaline Phosphatase: 84 U/L (ref 38–126)
Anion gap: 4 — ABNORMAL LOW (ref 5–15)
BILIRUBIN TOTAL: 0.9 mg/dL (ref 0.3–1.2)
BUN: 13 mg/dL (ref 6–20)
CHLORIDE: 107 mmol/L (ref 101–111)
CO2: 27 mmol/L (ref 22–32)
CREATININE: 1.16 mg/dL (ref 0.61–1.24)
Calcium: 8.8 mg/dL — ABNORMAL LOW (ref 8.9–10.3)
GFR calc Af Amer: >60 mL/min (ref >60)
GLUCOSE: 109 mg/dL — AB (ref 65–99)
POTASSIUM: 4.1 mmol/L (ref 3.5–5.1)
Sodium: 138 mmol/L (ref 135–145)
TOTAL PROTEIN: 6.6 g/dL (ref 6.5–8.1)

## 2015-11-22 LAB — CBC
HEMATOCRIT: 45.5 % (ref 39.0–52.0)
HEMOGLOBIN: 15.9 g/dL (ref 13.0–17.0)
MCH: 30.1 pg (ref 26.0–34.0)
MCHC: 34.9 g/dL (ref 30.0–36.0)
MCV: 86 fL (ref 78.0–100.0)
Platelets: 103 10*3/uL — ABNORMAL LOW (ref 150–400)
RBC: 5.29 MIL/uL (ref 4.22–5.81)
RDW: 13.4 % (ref 11.5–15.5)
WBC: 4.2 10*3/uL (ref 4.0–10.5)

## 2015-11-22 LAB — SURGICAL PCR SCREEN
MRSA, PCR: NEGATIVE
STAPHYLOCOCCUS AUREUS: NEGATIVE

## 2015-11-22 NOTE — Progress Notes (Signed)
PCP- Dyann Ruddle Cardiologist - denies EKG 08-22-12 [facility] - denies Stress test- denies   Patient denies shortness of breath and chest pain at PAT appointment

## 2015-11-23 NOTE — Progress Notes (Signed)
Anesthesia Chart Review:  Pt is a 70 year old male scheduled for L3-4 lateral discectomy (Wiltse approach) on 12/01/2015 with Dr. Rolena Infante.   PCP is Dr. Dyann Ruddle.    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 Never smoker. BMI 44  Medications include: myfortic, prograf  Preoperative labs reviewed.  Platelets 103, LFTs and renal function normal.   If no changes, I anticipate pt can proceed with surgery as scheduled.   Willeen Cass, FNP-BC [facility]/Anesthesiology Phone: [phone] 11/23/2015 2:11 PM

## 2015-11-30 NOTE — Anesthesia Preprocedure Evaluation (Addendum)
Anesthesia Evaluation  Patient identified by MRN, date of birth, ID band Patient awake    Reviewed: Allergy & Precautions, NPO status , Patient's Chart, lab work & pertinent test results  Airway Mallampati: III  TM Distance: >3 FB Neck ROM: Full    Dental  (+) Dental Advisory Given   Pulmonary neg pulmonary ROS,    breath sounds clear to auscultation       Cardiovascular negative cardio ROS   Rhythm:Regular Rate:Normal     Neuro/Psych negative neurological ROS     GI/Hepatic negative GI ROS, S/p liver transplant   Endo/Other  Morbid obesity  Renal/GU Renal disease (s/p kidney transplant in 2014)     Musculoskeletal  (+) Arthritis ,   Abdominal   Peds  Hematology  (+) Blood dyscrasia (Thrombocytopenia), ,   Anesthesia Other Findings   Reproductive/Obstetrics                            Lab Results  Component Value Date   WBC 4.2 11/22/2015   HGB 15.9 11/22/2015   HCT 45.5 11/22/2015   MCV 86.0 11/22/2015   PLT 103* 11/22/2015   Lab Results  Component Value Date   CREATININE 1.16 11/22/2015   BUN 13 11/22/2015   NA 138 11/22/2015   K 4.1 11/22/2015   CL 107 11/22/2015   CO2 27 11/22/2015   . Anesthesia Physical Anesthesia Plan  ASA: III  Anesthesia Plan: General   Post-op Pain Management:    Induction: Intravenous  Airway Management Planned: Oral ETT  Additional Equipment:   Intra-op Plan:   Post-operative Plan: Extubation in OR  Informed Consent: I have reviewed the patients History and Physical, chart, labs and discussed the procedure including the risks, benefits and alternatives for the proposed anesthesia with the patient or authorized representative who has indicated his/her understanding and acceptance.   Dental advisory given  Plan Discussed with: CRNA  Anesthesia Plan Comments:         Anesthesia Quick Evaluation

## 2015-12-01 ENCOUNTER — Encounter (HOSPITAL_COMMUNITY): Payer: Self-pay | Admitting: *Deleted

## 2015-12-01 ENCOUNTER — Ambulatory Visit (HOSPITAL_COMMUNITY): Payer: Medicare Other | Admitting: Emergency Medicine

## 2015-12-01 ENCOUNTER — Ambulatory Visit (HOSPITAL_COMMUNITY): Payer: Medicare Other

## 2015-12-01 ENCOUNTER — Encounter (HOSPITAL_COMMUNITY)
Admission: RE | Disposition: A | Discharge status: Home / Self Care | Payer: Self-pay | Source: Ambulatory Visit | Attending: Orthopedic Surgery

## 2015-12-01 ENCOUNTER — Ambulatory Visit (HOSPITAL_COMMUNITY): Payer: Medicare Other | Admitting: Anesthesiology

## 2015-12-01 ENCOUNTER — Observation Stay (HOSPITAL_COMMUNITY): Payer: Medicare Other

## 2015-12-01 ENCOUNTER — Observation Stay (HOSPITAL_COMMUNITY)
Admission: RE | Admit: 2015-12-01 | Discharge: 2015-12-02 | Disposition: A | Discharge status: Home / Self Care | Payer: Medicare Other | Source: Ambulatory Visit | Attending: Orthopedic Surgery | Admitting: Orthopedic Surgery

## 2015-12-01 DIAGNOSIS — M549 Dorsalgia, unspecified: Secondary | ICD-10-CM | POA: Diagnosis present

## 2015-12-01 DIAGNOSIS — I480 Paroxysmal atrial fibrillation: Secondary | ICD-10-CM | POA: Insufficient documentation

## 2015-12-01 DIAGNOSIS — E781 Pure hyperglyceridemia: Secondary | ICD-10-CM | POA: Insufficient documentation

## 2015-12-01 DIAGNOSIS — R262 Difficulty in walking, not elsewhere classified: Secondary | ICD-10-CM | POA: Insufficient documentation

## 2015-12-01 DIAGNOSIS — Z79899 Other long term (current) drug therapy: Secondary | ICD-10-CM | POA: Diagnosis not present

## 2015-12-01 DIAGNOSIS — I10 Essential (primary) hypertension: Secondary | ICD-10-CM | POA: Diagnosis not present

## 2015-12-01 DIAGNOSIS — M5116 Intervertebral disc disorders with radiculopathy, lumbar region: Principal | ICD-10-CM | POA: Insufficient documentation

## 2015-12-01 DIAGNOSIS — Z944 Liver transplant status: Secondary | ICD-10-CM | POA: Insufficient documentation

## 2015-12-01 DIAGNOSIS — Z940 Kidney transplant status: Secondary | ICD-10-CM | POA: Insufficient documentation

## 2015-12-01 DIAGNOSIS — E785 Hyperlipidemia, unspecified: Secondary | ICD-10-CM | POA: Diagnosis not present

## 2015-12-01 DIAGNOSIS — R112 Nausea with vomiting, unspecified: Secondary | ICD-10-CM | POA: Diagnosis not present

## 2015-12-01 DIAGNOSIS — K56609 Unspecified intestinal obstruction, unspecified as to partial versus complete obstruction: Secondary | ICD-10-CM

## 2015-12-01 DIAGNOSIS — Z6841 Body Mass Index (BMI) 40.0 and over, adult: Secondary | ICD-10-CM | POA: Insufficient documentation

## 2015-12-01 DIAGNOSIS — Z419 Encounter for procedure for purposes other than remedying health state, unspecified: Secondary | ICD-10-CM

## 2015-12-01 DIAGNOSIS — I4891 Unspecified atrial fibrillation: Secondary | ICD-10-CM

## 2015-12-01 HISTORY — PX: LUMBAR LAMINECTOMY/DECOMPRESSION MICRODISCECTOMY: SHX5026

## 2015-12-01 HISTORY — PX: LUMBAR DISC SURGERY: SHX700

## 2015-12-01 SURGERY — LUMBAR LAMINECTOMY/DECOMPRESSION MICRODISCECTOMY 1 LEVEL
Anesthesia: General | Site: Back | Laterality: Left

## 2015-12-01 MED ORDER — HYDROMORPHONE HCL 1 MG/ML IJ SOLN
INTRAMUSCULAR | Status: AC
  Filled 2015-12-01: qty 1

## 2015-12-01 MED ORDER — SUCCINYLCHOLINE CHLORIDE 200 MG/10ML IV SOSY
PREFILLED_SYRINGE | INTRAVENOUS | Status: AC
  Filled 2015-12-01: qty 10

## 2015-12-01 MED ORDER — PROMETHAZINE HCL 25 MG/ML IJ SOLN
INTRAMUSCULAR | Status: AC
  Filled 2015-12-01: qty 1

## 2015-12-01 MED ORDER — SODIUM CHLORIDE 0.9 % IV SOLN: 250.0000 mL | INTRAVENOUS | Status: DC

## 2015-12-01 MED ORDER — TACROLIMUS 1 MG PO CAPS
1.0000 mg | ORAL_CAPSULE | Freq: Two times a day (BID) | ORAL | Status: DC
  Administered 2015-12-02: 1 mg via ORAL
  Filled 2015-12-01 (×3): qty 1

## 2015-12-01 MED ORDER — HEMOSTATIC AGENTS (NO CHARGE) OPTIME
TOPICAL | Status: DC | PRN
  Administered 2015-12-01: 1 via TOPICAL

## 2015-12-01 MED ORDER — ACETAMINOPHEN 10 MG/ML IV SOLN
INTRAVENOUS | Status: DC | PRN
  Administered 2015-12-01: 1000 mg via INTRAVENOUS

## 2015-12-01 MED ORDER — 0.9 % SODIUM CHLORIDE (POUR BTL) OPTIME
TOPICAL | Status: DC | PRN
  Administered 2015-12-01: 1000 mL

## 2015-12-01 MED ORDER — ONDANSETRON HCL 4 MG/2ML IJ SOLN
INTRAMUSCULAR | Status: DC | PRN
  Administered 2015-12-01: 4 mg via INTRAVENOUS

## 2015-12-01 MED ORDER — ONDANSETRON HCL 4 MG/2ML IJ SOLN
4.0000 mg | INTRAMUSCULAR | Status: DC | PRN
  Filled 2015-12-01: qty 2

## 2015-12-01 MED ORDER — FENTANYL CITRATE (PF) 250 MCG/5ML IJ SOLN
INTRAMUSCULAR | Status: AC
  Filled 2015-12-01: qty 5

## 2015-12-01 MED ORDER — THROMBIN 20000 UNITS EX SOLR
CUTANEOUS | Status: AC
  Filled 2015-12-01: qty 20000

## 2015-12-01 MED ORDER — METHOCARBAMOL 500 MG PO TABS: 500.0000 mg | ORAL_TABLET | Freq: Three times a day (TID) | ORAL | Status: DC | PRN

## 2015-12-01 MED ORDER — SODIUM CHLORIDE 0.9% FLUSH
3.0000 mL | Freq: Two times a day (BID) | INTRAVENOUS | Status: DC
  Administered 2015-12-01: 3 mL via INTRAVENOUS

## 2015-12-01 MED ORDER — DEXTROSE 5 % IV SOLN
10.0000 mg | INTRAVENOUS | Status: DC | PRN
  Administered 2015-12-01: 30 ug/min via INTRAVENOUS

## 2015-12-01 MED ORDER — SUGAMMADEX SODIUM 500 MG/5ML IV SOLN
INTRAVENOUS | Status: DC | PRN
  Administered 2015-12-01: 300 mg via INTRAVENOUS

## 2015-12-01 MED ORDER — METOCLOPRAMIDE HCL 5 MG/ML IJ SOLN
INTRAMUSCULAR | Status: AC
  Filled 2015-12-01: qty 2

## 2015-12-01 MED ORDER — VANCOMYCIN HCL 1000 MG IV SOLR
1000.0000 mg | INTRAVENOUS | Status: DC | PRN
  Administered 2015-12-01: 1000 mg via INTRAVENOUS

## 2015-12-01 MED ORDER — MYCOPHENOLATE SODIUM 180 MG PO TBEC
180.0000 mg | DELAYED_RELEASE_TABLET | Freq: Two times a day (BID) | ORAL | Status: DC
  Administered 2015-12-02: 180 mg via ORAL
  Filled 2015-12-01 (×3): qty 1

## 2015-12-01 MED ORDER — PROPOFOL 10 MG/ML IV BOLUS
INTRAVENOUS | Status: AC
  Filled 2015-12-01: qty 20

## 2015-12-01 MED ORDER — BUPIVACAINE-EPINEPHRINE (PF) 0.25% -1:200000 IJ SOLN
INTRAMUSCULAR | Status: AC
  Filled 2015-12-01: qty 30

## 2015-12-01 MED ORDER — OXYCODONE HCL 5 MG PO TABS
10.0000 mg | ORAL_TABLET | ORAL | Status: DC | PRN
  Administered 2015-12-01 – 2015-12-02 (×4): 10 mg via ORAL
  Filled 2015-12-01 (×4): qty 2

## 2015-12-01 MED ORDER — METOPROLOL TARTRATE 5 MG/5ML IV SOLN
2.5000 mg | Freq: Once | INTRAVENOUS | Status: AC
  Administered 2015-12-01: 2.5 mg via INTRAVENOUS

## 2015-12-01 MED ORDER — METOPROLOL TARTRATE 5 MG/5ML IV SOLN
INTRAVENOUS | Status: AC
  Filled 2015-12-01: qty 5

## 2015-12-01 MED ORDER — THROMBIN 20000 UNITS EX SOLR
CUTANEOUS | Status: DC | PRN
  Administered 2015-12-01: 20 mL via TOPICAL

## 2015-12-01 MED ORDER — EPHEDRINE 5 MG/ML INJ
INTRAVENOUS | Status: AC
  Filled 2015-12-01: qty 10

## 2015-12-01 MED ORDER — LIDOCAINE 2% (20 MG/ML) 5 ML SYRINGE
INTRAMUSCULAR | Status: AC
Start: 2015-12-01 — End: 2015-12-01
  Filled 2015-12-01: qty 5

## 2015-12-01 MED ORDER — SUGAMMADEX SODIUM 500 MG/5ML IV SOLN
INTRAVENOUS | Status: AC
  Filled 2015-12-01: qty 5

## 2015-12-01 MED ORDER — ONDANSETRON HCL 4 MG/2ML IJ SOLN
INTRAMUSCULAR | Status: AC
  Filled 2015-12-01: qty 2

## 2015-12-01 MED ORDER — METHOCARBAMOL 500 MG PO TABS: 500.0000 mg | ORAL_TABLET | Freq: Four times a day (QID) | ORAL | Status: DC | PRN

## 2015-12-01 MED ORDER — METHOCARBAMOL 1000 MG/10ML IJ SOLN
500.0000 mg | Freq: Four times a day (QID) | INTRAVENOUS | Status: DC | PRN
  Filled 2015-12-01: qty 5

## 2015-12-01 MED ORDER — EPHEDRINE SULFATE 50 MG/ML IJ SOLN
INTRAMUSCULAR | Status: DC | PRN
  Administered 2015-12-01 (×2): 10 mg via INTRAVENOUS
  Administered 2015-12-01: 15 mg via INTRAVENOUS

## 2015-12-01 MED ORDER — VANCOMYCIN HCL IN DEXTROSE 1-5 GM/200ML-% IV SOLN
INTRAVENOUS | Status: AC
  Filled 2015-12-01: qty 200

## 2015-12-01 MED ORDER — MENTHOL 3 MG MT LOZG: 1.0000 | LOZENGE | OROMUCOSAL | Status: DC | PRN

## 2015-12-01 MED ORDER — METOCLOPRAMIDE HCL 5 MG/ML IJ SOLN
10.0000 mg | Freq: Once | INTRAMUSCULAR | Status: AC
  Administered 2015-12-01: 10 mg via INTRAVENOUS

## 2015-12-01 MED ORDER — LIDOCAINE HCL (CARDIAC) 20 MG/ML IV SOLN
INTRAVENOUS | Status: DC | PRN
  Administered 2015-12-01: 60 mg via INTRAVENOUS

## 2015-12-01 MED ORDER — BUPIVACAINE-EPINEPHRINE 0.25% -1:200000 IJ SOLN
INTRAMUSCULAR | Status: DC | PRN
  Administered 2015-12-01: 10 mL

## 2015-12-01 MED ORDER — LACTATED RINGERS IV SOLN
INTRAVENOUS | Status: DC | PRN
  Administered 2015-12-01 (×2): via INTRAVENOUS

## 2015-12-01 MED ORDER — MORPHINE SULFATE (PF) 2 MG/ML IV SOLN: 1.0000 mg | INTRAVENOUS | Status: DC | PRN

## 2015-12-01 MED ORDER — ROCURONIUM BROMIDE 100 MG/10ML IV SOLN
INTRAVENOUS | Status: DC | PRN
  Administered 2015-12-01: 50 mg via INTRAVENOUS

## 2015-12-01 MED ORDER — ONDANSETRON HCL 4 MG PO TABS: 4.0000 mg | ORAL_TABLET | Freq: Three times a day (TID) | ORAL | Status: DC | PRN

## 2015-12-01 MED ORDER — OXYCODONE-ACETAMINOPHEN 10-325 MG PO TABS: 1.0000 | ORAL_TABLET | ORAL | Status: DC | PRN

## 2015-12-01 MED ORDER — MIDAZOLAM HCL 2 MG/2ML IJ SOLN
INTRAMUSCULAR | Status: AC
  Filled 2015-12-01: qty 2

## 2015-12-01 MED ORDER — ACETAMINOPHEN 10 MG/ML IV SOLN
INTRAVENOUS | Status: AC
  Filled 2015-12-01: qty 100

## 2015-12-01 MED ORDER — PROPOFOL 10 MG/ML IV BOLUS
INTRAVENOUS | Status: DC | PRN
  Administered 2015-12-01: 200 mg via INTRAVENOUS

## 2015-12-01 MED ORDER — PHENOL 1.4 % MT LIQD: 1.0000 | OROMUCOSAL | Status: DC | PRN

## 2015-12-01 MED ORDER — LACTATED RINGERS IV SOLN: INTRAVENOUS | Status: DC

## 2015-12-01 MED ORDER — PROMETHAZINE HCL 25 MG/ML IJ SOLN
6.2500 mg | INTRAMUSCULAR | Status: AC
  Administered 2015-12-01: 6.25 mg via INTRAVENOUS

## 2015-12-01 MED ORDER — MIDAZOLAM HCL 5 MG/5ML IJ SOLN
INTRAMUSCULAR | Status: DC | PRN
  Administered 2015-12-01: 2 mg via INTRAVENOUS

## 2015-12-01 MED ORDER — HYDROMORPHONE HCL 1 MG/ML IJ SOLN
0.2500 mg | INTRAMUSCULAR | Status: DC | PRN
  Administered 2015-12-01 (×2): 0.25 mg via INTRAVENOUS

## 2015-12-01 MED ORDER — ROCURONIUM BROMIDE 50 MG/5ML IV SOLN
INTRAVENOUS | Status: AC
  Filled 2015-12-01: qty 1

## 2015-12-01 MED ORDER — PROMETHAZINE HCL 25 MG/ML IJ SOLN
12.5000 mg | INTRAMUSCULAR | Status: AC
  Administered 2015-12-01: 12.5 mg via INTRAVENOUS

## 2015-12-01 MED ORDER — ONDANSETRON HCL 4 MG/2ML IJ SOLN
4.0000 mg | Freq: Once | INTRAMUSCULAR | Status: AC
  Administered 2015-12-01: 4 mg via INTRAVENOUS

## 2015-12-01 MED ORDER — FENTANYL CITRATE (PF) 100 MCG/2ML IJ SOLN
INTRAMUSCULAR | Status: DC | PRN
  Administered 2015-12-01 (×5): 50 ug via INTRAVENOUS
  Administered 2015-12-01: 100 ug via INTRAVENOUS
  Administered 2015-12-01: 150 ug via INTRAVENOUS

## 2015-12-01 MED ORDER — SUCCINYLCHOLINE CHLORIDE 20 MG/ML IJ SOLN
INTRAMUSCULAR | Status: DC | PRN
  Administered 2015-12-01: 120 mg via INTRAVENOUS

## 2015-12-01 MED ORDER — METOPROLOL TARTRATE 5 MG/5ML IV SOLN
5.0000 mg | Freq: Every day | INTRAVENOUS | Status: DC
  Filled 2015-12-01: qty 5

## 2015-12-01 MED ORDER — VANCOMYCIN HCL 10 G IV SOLR
2000.0000 mg | Freq: Once | INTRAVENOUS | Status: AC
  Administered 2015-12-01: 2000 mg via INTRAVENOUS
  Filled 2015-12-01: qty 2000

## 2015-12-01 MED ORDER — PHENYLEPHRINE HCL 10 MG/ML IJ SOLN
INTRAMUSCULAR | Status: DC | PRN
  Administered 2015-12-01: 240 ug via INTRAVENOUS
  Administered 2015-12-01: 40 ug via INTRAVENOUS

## 2015-12-01 MED ORDER — SODIUM CHLORIDE 0.9% FLUSH: 3.0000 mL | INTRAVENOUS | Status: DC | PRN

## 2015-12-01 SURGICAL SUPPLY — 64 items
BUR EGG ELITE 4.0 (BURR) IMPLANT
BUR MATCHSTICK NEURO 3.0 LAGG (BURR) IMPLANT
CANISTER SUCTION 2500CC (MISCELLANEOUS) ×2 IMPLANT
CLSR STERI-STRIP ANTIMIC 1/2X4 (GAUZE/BANDAGES/DRESSINGS) ×2 IMPLANT
CORDS BIPOLAR (ELECTRODE) ×2 IMPLANT
COVER SURGICAL LIGHT HANDLE (MISCELLANEOUS) ×2 IMPLANT
DRAIN CHANNEL 15F RND FF W/TCR (WOUND CARE) IMPLANT
DRAPE POUCH INSTRU U-SHP 10X18 (DRAPES) ×2 IMPLANT
DRAPE SURG 17X23 STRL (DRAPES) ×2 IMPLANT
DRAPE U-SHAPE 47X51 STRL (DRAPES) ×2 IMPLANT
DRSG AQUACEL AG ADV 3.5X 4 (GAUZE/BANDAGES/DRESSINGS) IMPLANT
DRSG AQUACEL AG ADV 3.5X 6 (GAUZE/BANDAGES/DRESSINGS) ×2 IMPLANT
DRSG MEPILEX BORDER 4X8 (GAUZE/BANDAGES/DRESSINGS) IMPLANT
DURAPREP 26ML APPLICATOR (WOUND CARE) ×2 IMPLANT
ELECT BLADE 4.0 EZ CLEAN MEGAD (MISCELLANEOUS)
ELECT CAUTERY BLADE 6.4 (BLADE) ×2 IMPLANT
ELECT PENCIL ROCKER SW 15FT (MISCELLANEOUS) ×2 IMPLANT
ELECT REM PT RETURN 9FT ADLT (ELECTROSURGICAL) ×2
ELECTRODE BLDE 4.0 EZ CLN MEGD (MISCELLANEOUS) IMPLANT
ELECTRODE REM PT RTRN 9FT ADLT (ELECTROSURGICAL) ×1 IMPLANT
EVACUATOR SILICONE 100CC (DRAIN) IMPLANT
GLOVE BIO SURGEON STRL SZ 6.5 (GLOVE) ×2 IMPLANT
GLOVE BIOGEL PI IND STRL 6.5 (GLOVE) ×1 IMPLANT
GLOVE BIOGEL PI IND STRL 8.5 (GLOVE) ×1 IMPLANT
GLOVE BIOGEL PI INDICATOR 6.5 (GLOVE) ×1
GLOVE BIOGEL PI INDICATOR 8.5 (GLOVE) ×1
GLOVE SS BIOGEL STRL SZ 8.5 (GLOVE) ×1 IMPLANT
GLOVE SUPERSENSE BIOGEL SZ 8.5 (GLOVE) ×1
GOWN STRL REUS W/TWL 2XL LVL3 (GOWN DISPOSABLE) ×4 IMPLANT
KIT BASIN OR (CUSTOM PROCEDURE TRAY) ×2 IMPLANT
KIT ROOM TURNOVER OR (KITS) ×2 IMPLANT
NEEDLE 22X1 1/2 (OR ONLY) (NEEDLE) ×2 IMPLANT
NEEDLE SPNL 18GX3.5 QUINCKE PK (NEEDLE) ×4 IMPLANT
NS IRRIG 1000ML POUR BTL (IV SOLUTION) ×2 IMPLANT
PACK LAMINECTOMY ORTHO (CUSTOM PROCEDURE TRAY) ×2 IMPLANT
PACK UNIVERSAL I (CUSTOM PROCEDURE TRAY) ×2 IMPLANT
PAD ARMBOARD 7.5X6 YLW CONV (MISCELLANEOUS) ×4 IMPLANT
PATTIES SURGICAL .5 X.5 (GAUZE/BANDAGES/DRESSINGS) IMPLANT
PATTIES SURGICAL .5 X1 (DISPOSABLE) ×2 IMPLANT
SPONGE SURGIFOAM ABS GEL 100 (HEMOSTASIS) ×2 IMPLANT
SURGIFLO W/THROMBIN 8M KIT (HEMOSTASIS) ×2 IMPLANT
SUT BONE WAX W31G (SUTURE) ×2 IMPLANT
SUT MON AB 3-0 SH 27 (SUTURE) ×1
SUT MON AB 3-0 SH27 (SUTURE) ×1 IMPLANT
SUT STRATAFIX 1PDS 45CM VIOLET (SUTURE) ×2 IMPLANT
SUT STRATAFIX MNCRL+ 3-0 PS-2 (SUTURE)
SUT STRATAFIX MONOCRYL 3-0 (SUTURE)
SUT STRATAFIX SPIRAL + 2-0 (SUTURE) IMPLANT
SUT VIC AB 0 CT1 27 (SUTURE)
SUT VIC AB 0 CT1 27XBRD ANBCTR (SUTURE) IMPLANT
SUT VIC AB 1 CT1 18XCR BRD 8 (SUTURE) ×1 IMPLANT
SUT VIC AB 1 CT1 27 (SUTURE) ×1
SUT VIC AB 1 CT1 27XBRD ANBCTR (SUTURE) ×1 IMPLANT
SUT VIC AB 1 CT1 8-18 (SUTURE) ×1
SUT VIC AB 1 CTX 36 (SUTURE)
SUT VIC AB 1 CTX36XBRD ANBCTR (SUTURE) IMPLANT
SUT VIC AB 2-0 CT1 18 (SUTURE) ×2 IMPLANT
SUTURE STRATFX MNCRL+ 3-0 PS-2 (SUTURE) IMPLANT
SYR BULB IRRIGATION 50ML (SYRINGE) ×2 IMPLANT
SYR CONTROL 10ML LL (SYRINGE) ×2 IMPLANT
TOWEL OR 17X24 6PK STRL BLUE (TOWEL DISPOSABLE) ×2 IMPLANT
TOWEL OR 17X26 10 PK STRL BLUE (TOWEL DISPOSABLE) ×2 IMPLANT
WATER STERILE IRR 1000ML POUR (IV SOLUTION) IMPLANT
YANKAUER SUCT BULB TIP NO VENT (SUCTIONS) ×2 IMPLANT

## 2015-12-01 NOTE — Brief Op Note (Signed)
12/01/2015  9:54 AM  PATIENT:  [name] [name]  69 y.o. male  PRE-OPERATIVE DIAGNOSIS:  L3-L4 LEFT LATERAL DISC HERNIATION   POST-OPERATIVE DIAGNOSIS:  L3-L4 LEFT LATERAL DISC HERNIATION   PROCEDURE:  Procedure(s): LEFT L3-L4 FAR LATERAL DISCECTOMY (WILTSE APPROACH)     (1 LEVEL) (Left)  SURGEON:  Surgeon(s) and Role:    * [name], MD - Primary  PHYSICIAN ASSISTANT:   ASSISTANTS: Carmen [name]   ANESTHESIA:   general  EBL:  Total I/O In: 1000 [I.V.:1000] Out: 100 [Blood:100]  BLOOD ADMINISTERED:none  DRAINS: none   LOCAL MEDICATIONS USED:  MARCAINE     SPECIMEN:  No Specimen  DISPOSITION OF SPECIMEN:  N/A  COUNTS:  YES  TOURNIQUET:  * No tourniquets in log *  DICTATION: .Other Dictation: Dictation Number [id number]  PLAN OF CARE: Admit for overnight observation  PATIENT DISPOSITION:  PACU - hemodynamically stable.

## 2015-12-01 NOTE — Anesthesia Postprocedure Evaluation (Signed)
Anesthesia Post Note  Patient: [name] [name]  Procedure(s) Performed: Procedure(s) (LRB): LEFT L3-L4 FAR LATERAL DISCECTOMY (WILTSE APPROACH)     (1 LEVEL) (Left)  Patient location during evaluation: PACU Anesthesia Type: General Level of consciousness: awake and alert Pain management: pain level controlled Vital Signs Assessment: post-procedure vital signs reviewed and stable Respiratory status: spontaneous breathing, nonlabored ventilation, respiratory function stable and patient connected to nasal cannula oxygen Cardiovascular status: blood pressure returned to baseline and stable Anesthetic complications: no Comments: N/V resolved. Currently NSR.     Last Vitals:  Filed Vitals:   12/01/15 1515 12/01/15 1530  BP: 158/94   Pulse: 121 92  Temp:    Resp: 15 15    Last Pain:  Filed Vitals:   12/01/15 1535  PainSc: Paul Nitya Cauthon

## 2015-12-01 NOTE — Progress Notes (Addendum)
Pharmacy Antibiotic Note  [name] [name] is a 70 y.o. male admitted on 12/01/2015 with surgical prophylaxis.  Pharmacy has been consulted for vancomycin dosing.  Patient received pre-op dose of vancomycin 1g at 0750 this morning. He does not have a drain in place.   Plan: Vancomycin 2000mg  IV x1 to be given tonight at [place]  No further doses needed, pharmacy to sign off  Height: 6' (182.9 cm) Weight: (!) 325 lb (147.419 kg) IBW/kg (Calculated) : 77.6  Temp (24hrs), Avg:97.6 F (36.4 C), Min:97.6 F (36.4 C), Max:97.6 F (36.4 C)  No results for input(s): WBC, CREATININE, LATICACIDVEN, VANCOTROUGH, VANCOPEAK, VANCORANDOM, GENTTROUGH, GENTPEAK, GENTRANDOM, TOBRATROUGH, TOBRAPEAK, TOBRARND, AMIKACINPEAK, AMIKACINTROU, AMIKACIN in the last 168 hours.  Estimated Creatinine Clearance: 92.2 mL/min (by C-G formula based on Cr of 1.16).    Allergies  Allergen Reactions  . Cephalosporins Other (See Comments)    Liver/Kidney failure   . Zyvox [Linezolid] Nausea Only and Other (See Comments)    Liver/Kidney failure.     Antimicrobials this admission: vancomycin 6/1 x2 doses  Thank you for allowing pharmacy to be a part of this patient's care.  [name] [name], PharmD, BCPS Clinical Pharmacist Pager: [phone] 12/01/2015 4:18 PM

## 2015-12-01 NOTE — Transfer of Care (Signed)
Immediate Anesthesia Transfer of Care Note  Patient: [name] [name]  Procedure(s) Performed: Procedure(s): LEFT L3-L4 FAR LATERAL DISCECTOMY (WILTSE APPROACH)     (1 LEVEL) (Left)  Patient Location: PACU  Anesthesia Type:General  Level of Consciousness: awake, alert , oriented and patient cooperative  Airway & Oxygen Therapy: Patient Spontanous Breathing and Patient connected to face mask oxygen  Post-op Assessment: Report given to RN, Post -op Vital signs reviewed and stable and Patient moving all extremities X 4  Post vital signs: Reviewed and stable  Last Vitals:  Filed Vitals:   12/01/15 0610  BP: 177/88  Pulse: 73  Temp: 36.4 C  Resp: 20    Last Pain: There were no vitals filed for this visit.    Patients Stated Pain Goal: 4 ([id number])  Complications: No apparent anesthesia complications

## 2015-12-01 NOTE — H&P (Signed)
History of Present Illness  The patient is a 70 year old male who comes in today for a preoperative History and Physical. The patient is scheduled for a left L3-4 lateral discectomy to be performed by Dr. Duane Lope D. Rolena Infante, MD at [facility] on 12-01-15 . Please see the hospital record for complete dictated history and physical. the pt has had a liver transplant and kidney transplant.   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)   Allergies Zyvox *ANTI-INFECTIVE AGENTS - MISC.*  liver and kidney failure Allergies Reconciled   Family History  No pertinent family history   Social History  Alcohol use  never consumed alcohol Children  2 Current work status  retired Engineer, agricultural (Currently)  no Drug/Alcohol Rehab (Previously)  no Exercise  Exercises daily; does running / walking, gym / [employer] and team sport Illicit drug use  no Living situation  live with spouse Marital status  married Number of flights of stairs before winded  greater than 5 Pain Contract  no Tobacco use  never smoker  Medication History  Prograf (1MG  Capsule, Oral) Active. (2.5mg ) Myfortic (180MG  Tablet DR, Oral) Active. Medications Reconciled  Past Surgical History  Carpal Tunnel Repair  bilateral Vasectomy   Other Problems  Gout  Transplantation; Liver  Transplantation; Renal   Vitals  11/25/2015 7:45 AM Weight: 322 lb Height: 72in Weight was reported by patient. Body Surface Area: 2.61 m Body Mass Index: 43.67 kg/m  Temp.: 98.63F(Oral)   General General Appearance-Not in acute distress. Orientation-Oriented X3. Build & Nutrition-Well nourished and Well developed.  Integumentary General Characteristics Surgical Scars - no surgical scar evidence  of previous lumbar surgery. Lumbar Spine-Skin examination of the lumbar spine is without deformity, skin lesions, lacerations or abrasions.  Chest and Lung Exam Auscultation Breath sounds - Normal and Clear.  Cardiovascular Auscultation Rhythm - Regular rate and rhythm.  Abdomen Palpation/Percussion Palpation and Percussion of the abdomen reveal - Soft, Non Tender and No Rebound tenderness.  Peripheral Vascular Lower Extremity Palpation - Posterior tibial pulse - Bilateral - 2+. Dorsalis pedis pulse - Bilateral - 2+.  Neurologic Sensation Lower Extremity - Left - sensation is diminished in the lower extremity. Right - sensation is intact in the lower extremity. Reflexes Patellar Reflex - Bilateral - 2+. Achilles Reflex - Bilateral - 2+. Clonus - Bilateral - clonus not present. Hoffman's Sign - Bilateral - Hoffman's sign not present. Testing Seated Straight Leg Raise - Bilateral - Seated straight leg raise negative.  Musculoskeletal Spine/Ribs/Pelvis  Lumbosacral Spine: Inspection and Palpation - Tenderness - left lumbar paraspinals tender to palpation and right lumbar paraspinals tender to palpation. Strength and Tone: Strength - Hip Flexion - Bilateral - 5/5. Knee Extension - Bilateral - 5/5. Knee Flexion - Bilateral - 5/5. Ankle Dorsiflexion - Bilateral - 5/5. Ankle Plantarflexion - Bilateral - 5/5. Heel walk - Bilateral - able to heel walk without difficulty. Toe Walk - Bilateral - able to walk on toes without difficulty. Heel-Toe Walk - Bilateral - able to heel-toe walk without difficulty. ROM - Flexion - moderately decreased range of motion and painful. Extension - moderately decreased range of motion and painful. Left Lateral Bending - moderately decreased range of motion and painful. Right Lateral Bending - moderately decreased range of motion and painful. Right Rotation - moderately decreased range of motion and painful. Left Rotation - moderately  decreased range of motion  and painful. Pain - . Lumbosacral Spine - Waddell's Signs - no Waddell's signs present. Lower Extremity Range of Motion - No true hip, knee or ankle pain with range of motion. Gait and Station - [employer] - no assistive devices.  IMAGING MRI from 11/09/2015 showed severe left neural foraminal narrowing related to a left foraminal far lateral disc extrusion with encroachment on the left L3 dorsal root ganglion and left L3 nerve root. There is severe right neural foraminal narrowing at L4-L5. Moderate-to-severe right neural foraminal narrowing at L5-S1. At this point in time, the patient has no right radicular leg symptoms, only pain and dysfunction in the left L3 nerve distribution. This does correlate with his far lateral disc herniation at L3-L4. This has been getting progressively worse since over the last six months.   Assessment & Plan  A very pleasant 70 year old male that is scheduled for a lateral discectomy at L3-4 on the left. He is alert and awake. He has exquisite L3 left radicular pain, lateral aspect of the thigh. He has no motor deficits currently. His compartments are soft and nontender. He does have significant pain with femoral stretch. Negative straight leg test. He does have some back pain, but is not as severe as his left leg pain.  Posterior Lumbar Decompression/disectomy: Risks of surgery include infection, bleeding, nerve damage, death, stroke, paralysis, failure to heal, need for further surgery, ongoing or worse pain, need for further surgery, CSF leak, loss of bowel or bladder, and recurrent disc herniation or Stenosis which would necessitate need for further surgery. Injury to dorsal root ganglion and developing complex regional pain syndrome  Goal Of Surgery: Discussed that goal of surgery is to reduce pain and improve function and quality of life. Patient is aware that despite all appropriate treatment that there pain and function could be the same, worse, or  different.  As the result of the chronic radicular leg pain, the failure to improve, the positive correlation between clinical symptoms and MRI, we did discuss injection therapy versus surgical intervention. At this point, he is just tired and would like to have it fixed. I think that is reasonable. We have gone over risks and benefits of surgery. Our plan is to get clearance from his primary care physician and then move forward with surgery in a coordinated timely fashion.

## 2015-12-01 NOTE — Plan of Care (Signed)
Problem: Education: Goal: Knowledge of [facility] Education information/materials will improve Outcome: Progressing Pt made aware of call bell and telephone in the room. Pt currently has no complaints of pain or nausea, only desires to rest. No appetite at this time. Back brace at bedside. Incentive spirometer given to pt, 1200 ml achieved. VVS. Will continue to monitor, wife at bedside.

## 2015-12-01 NOTE — Progress Notes (Signed)
Patient vomited again. Dr. Lissa Hoard notified orders received

## 2015-12-01 NOTE — Progress Notes (Signed)
Dr. Rolena Infante at bedside with family updating them on progress of patient. Cardiology was at bedside and discussed with patient the plan. Patient does not complain of any chest pain or dizziness. Will continue to monitor

## 2015-12-01 NOTE — Anesthesia Procedure Notes (Addendum)
Procedure Name: Intubation Date/Time: 12/01/2015 7:45 AM Performed by: Lance Coon Pre-anesthesia Checklist: Patient identified, Timeout performed, Emergency Drugs available, Suction available and Patient being monitored Patient Re-evaluated:Patient Re-evaluated prior to inductionOxygen Delivery Method: Circle system utilized Preoxygenation: Pre-oxygenation with 100% oxygen Intubation Type: IV induction Ventilation: Two handed mask ventilation required Laryngoscope Size: Miller and 3 Grade View: Grade I Tube type: Oral Tube size: 7.5 mm Number of attempts: 1 Airway Equipment and Method: Stylet Placement Confirmation: ETT inserted through vocal cords under direct vision,  positive ETCO2 and breath sounds checked- equal and bilateral Secured at: 23 cm Tube secured with: Tape Dental Injury: Teeth and Oropharynx as per pre-operative assessment

## 2015-12-01 NOTE — Consult Note (Signed)
CARDIOLOGY CONSULT NOTE   Patient ID: [name] [name] MRN: [id number] DOB/AGE: [birth year] 70 y.o.  Admit date: 12/01/2015  Requesting Physician: Dr. Rolena Infante Primary Physician:   [name] Ruddle, MD Primary Cardiologist:  New- Dr. Harrington Challenger Reason for Consultation:   afib with RVR  HPI: [name] [name] is a 70 y.o. 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 After the procedure, the pateitn went into afib with RVR and cardiology was consulted.   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 He has been intolerant to Lipitor and Crestor due to gout. BP has been noted to be elevated on multiple occasions during   Patient told me that his liver and kidney failure was medication induced by an antibiotic to treat cellulitis. He is very wary of starting any new medication. He has had a previous sleep study which was negative for OSA.  He presented to [facility] today for planned lumbar discectomy. Post op he had some n/v. Around 11am he went into afib with RVR. He is completely asymptomatic with this. No CP or SOB. No palpitations, dizziness or syncope. He denies any symptoms of palpitations previous to this. He denies a history of CAD, CVA, HTN or DM. No history of exertional CP or SOB. NO LE edema, orthopnea or PND.    Past Medical History  Diagnosis Date  . Gout   . End stage renal disease ([place])   . Obesity   . Kidney transplant recipient   . Liver transplant recipient [facility])   . Nephrolithiasis   . Benign neoplasm of colon   . Neuromuscular disorder ([place])   . Arthritis     spine, hips     Past Surgical History    Procedure Laterality Date  . Liver transplant    . Colonoscopy    . Nephrectomy recipient    . Carpal tunnel release    . Umbilical hernia repair    . Av fistula placement    . Pr ligath angioaccess av fistula    . Av fistula removed  2014  . Eye surgery      lasix surgery  . Cholecystectomy      Allergies  Allergen Reactions  . Cephalosporins Other (See Comments)    Liver/Kidney failure   . Zyvox [Linezolid] Nausea Only and Other (See Comments)    Liver/Kidney failure.     I have reviewed the patient's current medications . HYDROmorphone      . metoCLOPramide      . metoprolol      . ondansetron       . lactated ringers     HYDROmorphone (DILAUDID) injection  Prior to Admission medications   Medication Sig Start Date End Date Taking? Authorizing Provider  Multiple Vitamin (MULTIVITAMIN WITH MINERALS) TABS tablet Take 1 tablet by mouth daily.   Yes Historical Provider, MD  mycophenolate (MYFORTIC) 180 MG EC tablet Take 180 mg by mouth 2 (two) times daily.   Yes Historical Provider, MD  Omega-3 Fatty Acids (FISH OIL) 1000 MG CAPS Take 1,000 mg by mouth daily.   Yes Historical Provider, MD  tacrolimus (PROGRAF) 1 MG  capsule Take 1 mg by mouth 2 (two) times daily.   Yes Historical Provider, MD  methocarbamol (ROBAXIN) 500 MG tablet Take 1 tablet (500 mg total) by mouth 3 (three) times daily as needed for muscle spasms. 12/01/15   [name], MD  ondansetron (ZOFRAN) 4 MG tablet Take 1 tablet (4 mg total) by mouth every 8 (eight) hours as needed for nausea or vomiting. 12/01/15   [name], MD  oxyCODONE-acetaminophen (PERCOCET) 10-325 MG tablet Take 1 tablet by mouth every 4 (four) hours as needed for pain. 12/01/15   [name], MD     Social History   Social History  . Marital Status: Married    Spouse Name: N/A  . Number of Children: N/A  . Years of Education: N/A   Occupational History  . Not on file.   Social History Main Topics  . Smoking status: Never  Smoker   . Smokeless tobacco: Never Used  . Alcohol Use: No  . Drug Use: No  . Sexual Activity: Not on file   Other Topics Concern  . Not on file   Social History Narrative    No family status information on file.   History reviewed. No pertinent family history.   ROS:  Full 14 point review of systems complete and found to be negative unless listed above.  Physical Exam: Blood pressure 168/110, pulse 40, temperature 97.6 F (36.4 C), temperature source Oral, resp. rate 18, height 6' (1.829 m), weight 325 lb (147.419 kg), SpO2 96 %.  General: Well developed, well nourished, male in no acute distress, morbidly obese Head: Eyes PERRLA, No xanthomas.   Normocephalic and atraumatic, oropharynx without edema or exudate.   Lungs: CTAB Heart: HRRR S1 S2, no rub/gallop, Heart irregular rate rhythm, tachu with S1, S2  murmur. pulses are 2+ extrem.   Neck: No carotid bruits. No lymphadenopathy.  No JVD. Abdomen: Bowel sounds present, abdomen soft and non-tender without masses or hernias noted. Msk:  No spine or cva tenderness. No weakness, no joint deformities or effusions. Extremities: No clubbing or cyanosis. No LE edema.  Neuro: Alert and oriented X 3. No focal deficits noted. Psych:  Good affect, responds appropriately Skin: No rashes or lesions noted.  Labs:   Lab Results  Component Value Date   WBC 4.2 11/22/2015   HGB 15.9 11/22/2015   HCT 45.5 11/22/2015   MCV 86.0 11/22/2015   PLT 103* 11/22/2015    Echo: none  ECG:  afib with RVR HR 132  Radiology:  Dg Lumbar Spine 2-3 Views  12/01/2015  CLINICAL DATA:  Lumbar spine surgery. EXAM: LUMBAR SPINE - 2-3 VIEW COMPARISON:  No recent prior. FINDINGS: Lumbar vertebra numbered with the lowest segmented appearing lumbar shaped vertebra on lateral view as L5. Metallic markers are noted posteriorly at L3 and L4. Metallic probe noted at the L3-L4 disc space posteriorly . IMPRESSION: Metallic probe noted at the L3-L4 disc space  posteriorly. Electronically Signed   By: Marcello Moores  Register   On: 12/01/2015 08:41    ASSESSMENT AND PLAN:    Active Problems:   Back pain   Atrial fibrillation with RVR ([place])   Morbid obesity ([place])  Hakeen A Mccloud is a 70 y.o. 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 After the procedure, the pt went into afib with RVR and cardiology was consulted.  Newly diagnosed afib with RVR: CHADSVASC score of at least 1 for age. But likely has HTN. If score >2 would recommend long term AC. Will order 2D ECHO to assess cardiac structure and function. Would start IV lopressor for rate control. He has had a previous sleep study which was negative for OSA.  Hyperglycemia: will get HgA1c for further risk stratification.   Probable HTN: he denies a hx. BP has been noted to be elevated on multiple occasions on office visitst BPs have been significantly elevated here but could be due to n/v and pain. Continue to monitor   Hx of liver and kidney transplant: on long term immunosuppressive therapy.   Hyperlipidemia with hypertriglyceridemia: Patient has been intolerant of statins in the past due to myopathy. He will continue Lovaza. Plan was to attempt to control hypertriglyceridemia through dietary modification and exercise.  Morbid obesity: he has been working on weight loss  Signed: Angelena Form, PA-C 12/01/2015 2:00 PM  Pager [phone]  Co-Sign MD  Pt seen and examined  I Agree with findings of K Thompson above.   Pt is a 70 yo with no prior cardiac history  Now s/p discectomy.  Post op developed afib with RVR Pt deneis CP  No SOB  No dizziness.  HR 120s to 130s  Lungs are clear anterlorly  Neck is full  No bruits  Cardiac exam:  Irreg Irreg  No S3  No murmurs Ext with 2+ pulses  No edema  PAF.  CHADSVASC poss 1.  WOuld get echo  Follow BP    I would recomm intermitt lopressor IV for HR control   COntinue telemetry  Check TSH    HTN  Follow       Will continue to follow with you.    Dorris Carnes

## 2015-12-01 NOTE — Progress Notes (Signed)
Dr Deatra Canter here, he has updated Dr Rolena Infante (who is in [place]). Pt to have cardiology consult. Pt much more comfortable as n/v has subsided. # 2 dose IV Metoprolol given as ordered. Will continue to monitor closely.

## 2015-12-01 NOTE — Progress Notes (Signed)
Lunch relief by MA Jaleyah Longhi RN 

## 2015-12-02 ENCOUNTER — Encounter (HOSPITAL_COMMUNITY): Payer: Self-pay | Admitting: Orthopedic Surgery

## 2015-12-02 ENCOUNTER — Other Ambulatory Visit (HOSPITAL_COMMUNITY): Payer: Self-pay

## 2015-12-02 DIAGNOSIS — M5116 Intervertebral disc disorders with radiculopathy, lumbar region: Secondary | ICD-10-CM | POA: Diagnosis not present

## 2015-12-02 DIAGNOSIS — I10 Essential (primary) hypertension: Secondary | ICD-10-CM | POA: Diagnosis not present

## 2015-12-02 DIAGNOSIS — I480 Paroxysmal atrial fibrillation: Secondary | ICD-10-CM | POA: Diagnosis not present

## 2015-12-02 DIAGNOSIS — Z6841 Body Mass Index (BMI) 40.0 and over, adult: Secondary | ICD-10-CM | POA: Diagnosis not present

## 2015-12-02 DIAGNOSIS — I4891 Unspecified atrial fibrillation: Secondary | ICD-10-CM | POA: Diagnosis not present

## 2015-12-02 LAB — HEMOGLOBIN A1C
Hgb A1c MFr Bld: 5.8 % — ABNORMAL HIGH (ref 4.8–5.6)
MEAN PLASMA GLUCOSE: 120 mg/dL

## 2015-12-02 LAB — GLUCOSE, CAPILLARY: Glucose-Capillary: 113 mg/dL — ABNORMAL HIGH (ref 65–99)

## 2015-12-02 MED ORDER — PREGABALIN 50 MG PO CAPS
75.0000 mg | ORAL_CAPSULE | Freq: Two times a day (BID) | ORAL | Status: DC
  Administered 2015-12-02: 75 mg via ORAL
  Filled 2015-12-02: qty 1

## 2015-12-02 NOTE — Evaluation (Signed)
Occupational Therapy Evaluation Patient Details Name: [name] [name] MRN: [id number] DOB: 03/26/1946 Today's Date: 12/02/2015    History of Present Illness 70 yo male with onset of lumbar pain and progressive immobility was seen for L L3-4 discectomy with back brace fitting.  PMHx:  liver and kidney transplant, gout, HLD, a-fib with RVR, morbid obesity, cellulitis, HD, CTS     Clinical Impression   Pt was independent prior to admission. Presents with increased pain, decreased activity tolerance, impaired standing balance and obesity interfering with ability to perform ADL at his baseline. Will follow acutely.    Follow Up Recommendations  No OT follow up    Equipment Recommendations   (bariatric RW)    Recommendations for Other Services       Precautions / Restrictions Precautions Precautions: Fall;Back Precaution Booklet Issued: Yes (comment) Required Braces or Orthoses: Spinal Brace Spinal Brace: Applied in sitting position Restrictions Weight Bearing Restrictions: No      Mobility Bed Mobility Overal bed mobility: Needs Assistance Bed Mobility: Sidelying to Sit;Sit to Sidelying;Rolling Rolling: Min assist Sidelying to sit: Mod assist     Sit to sidelying: Min assist General bed mobility comments: using arms effectively to control descent to bed  Transfers Overall transfer level: Needs assistance Equipment used: Rolling walker (2 wheeled) Transfers: Sit to/from Stand Sit to Stand: Min assist;From elevated surface Stand pivot transfers: Min assist       General transfer comment: cues for hand placement, min assist to rise, but does not like assistance    Balance Overall balance assessment: Needs assistance Sitting-balance support: Feet supported Sitting balance-Leahy Scale: Good     Standing balance support: Bilateral upper extremity supported Standing balance-Leahy Scale: Fair                              ADL Overall ADL's : Needs  assistance/impaired Eating/Feeding: Independent;Sitting   Grooming: Wash/dry hands;Wash/dry face;Sitting;Set up   Upper Body Bathing: Minimal assitance;Sitting   Lower Body Bathing: Maximal assistance;Sit to/from stand   Upper Body Dressing : Set up;Sitting Upper Body Dressing Details (indicate cue type and reason): including back brace Lower Body Dressing: Maximal assistance;Sit to/from stand         Toileting - Clothing Manipulation Details (indicate cue type and reason): instructed to avoid twisting with pericare     Functional mobility during ADLs: +2 for safety/equipment;Min guard;Rolling walker General ADL Comments: Pt was using his reacher he picks up limbs for LB dressing prior to admission.     Vision     Perception     Praxis      Pertinent Vitals/Pain Pain Assessment: 0-10 Pain Score: 8  Pain Location: L LE Pain Descriptors / Indicators: Grimacing;Guarding;Cramping;Aching Pain Intervention(s): Limited activity within patient's tolerance;Monitored during session;Premedicated before session;Repositioned     Hand Dominance Right   Extremity/Trunk Assessment Upper Extremity Assessment Upper Extremity Assessment: Overall WFL for tasks assessed   Lower Extremity Assessment Lower Extremity Assessment: Defer to PT evaluation   Cervical / Trunk Assessment Cervical / Trunk Assessment: Normal   Communication Communication Communication: No difficulties   Cognition Arousal/Alertness: Awake/alert Behavior During Therapy: WFL for tasks assessed/performed Overall Cognitive Status: Within Functional Limits for tasks assessed                     General Comments       Exercises       Shoulder Instructions      Home  Living Family/patient expects to be discharged to:: Private residence Living Arrangements: Spouse/significant other Available Help at Discharge: Family;Available 24 hours/day Type of Home: House Home Access: Level entry     Home  Layout: Two level;Able to live on main level with bedroom/bathroom     Bathroom Shower/Tub: Occupational psychologist: Handicapped height Bathroom Accessibility: Yes   Home Equipment: Environmental consultant - 2 wheels;Bedside commode;Adaptive equipment (walker is not bariatric) Adaptive Equipment: Reacher        Prior Functioning/Environment Level of Independence: Independent        Comments: denies a fall history    OT Diagnosis: Generalized weakness;Acute pain   OT Problem List: Decreased strength;Decreased activity tolerance;Impaired balance (sitting and/or standing);Decreased knowledge of use of DME or AE;Decreased knowledge of precautions;Obesity;Pain   OT Treatment/Interventions: Self-care/ADL training;Patient/family education;Balance training;DME and/or AE instruction;Therapeutic activities    OT Goals(Current goals can be found in the care plan section) Acute Rehab OT Goals Patient Stated Goal: to be in much less pain OT Goal Formulation: With patient Time For Goal Achievement: 12/09/15 Potential to Achieve Goals: Good ADL Goals Pt Will Perform Grooming: with supervision;standing Pt Will Perform Lower Body Bathing: with supervision;sit to/from stand;with adaptive equipment Pt Will Perform Lower Body Dressing: with supervision;with adaptive equipment;sit to/from stand Pt Will Transfer to Toilet: with supervision;ambulating;bedside commode (BSC over toilet, pt has a high toilet at home) Pt Will Perform Toileting - Clothing Manipulation and hygiene: with supervision;with adaptive equipment;sit to/from stand Pt Will Perform Tub/Shower Transfer: Shower transfer;with supervision;ambulating;rolling walker (pt may have a 3 in 1 at home)  OT Frequency: Min 2X/week   Barriers to D/C:            Co-evaluation PT/OT/SLP Co-Evaluation/Treatment: Yes Reason for Co-Treatment: For patient/therapist safety PT goals addressed during session: Mobility/safety with mobility;Balance OT  goals addressed during session: ADL's and self-care      End of Session Equipment Utilized During Treatment: Gait belt;Rolling walker;Back brace  Activity Tolerance: Patient limited by pain Patient left: in bed;with call bell/phone within reach   Time: 1139-1159 OT Time Calculation (min): 20 min Charges:  OT General Charges $OT Visit: 1 Procedure OT Evaluation $OT Eval Moderate Complexity: 1 Procedure G-Codes: OT G-codes **NOT FOR INPATIENT CLASS** Functional Assessment Tool Used: clinical judgement Functional Limitation: Self care Self Care Current Status [id number]): At least 40 percent but less than 60 percent impaired, limited or restricted Self Care Goal Status [id number]): At least 1 percent but less than 20 percent impaired, limited or restricted  Malka So 12/02/2015, 1:29 PM  [phone]

## 2015-12-02 NOTE — Evaluation (Signed)
Physical Therapy Evaluation Patient Details Name: [name] [name] MRN: [id number] DOB: 10/22/1945 Today's Date: 12/02/2015   History of Present Illness  70 yo male with onset of lumbar pain and progressive immobility was seen for L L3-4 discectomy with back brace fitting.  PMHx:  liver and kidney transplant, gout, HLD, a-fib with RVR, morbid obesity, cellulitis, HD, CTS    Clinical Impression  Pt has a limited amount of tolerance for activity as his pain is significant despite administration of meds by nursing.  He is struggling to sit up and returned to bed with therapists due to his pain after the walk.  Instructed in back precautions and will recommend follow up therapy to increase strength and safety with all mobiltiy.    Follow Up Recommendations Home health PT;Supervision for mobility/OOB    Equipment Recommendations  Rolling walker with 5" wheels;Other (comment) (Pt thinks he has a shower chair)    Recommendations for Other Services Rehab consult     Precautions / Restrictions Precautions Precautions: Fall;Back (telemetry) Precaution Booklet Issued: Yes (comment) Required Braces or Orthoses: Spinal Brace Spinal Brace: Applied in sitting position Restrictions Weight Bearing Restrictions: No      Mobility  Bed Mobility Overal bed mobility: Needs Assistance Bed Mobility: Sidelying to Sit;Sit to Sidelying;Rolling Rolling: Min assist Sidelying to sit: Mod assist     Sit to sidelying: Min assist General bed mobility comments: using arms effectively to control descent to bed  Transfers Overall transfer level: Needs assistance Equipment used: Rolling walker (2 wheeled);1 person hand held assist Transfers: Sit to/from [employer] Sit to Stand: Min assist;Min guard;Supervision;From elevated surface (using walker initially to support hands to stand, discussed ) Stand pivot transfers: Min guard;Min assist       General transfer comment: Pt does not want help  but is not secure without some verbal and  tactile cues  Ambulation/Gait Ambulation/Gait assistance: Min assist;+2 physical assistance;+2 safety/equipment Ambulation Distance (Feet): 90 Feet Assistive device: Rolling walker (2 wheeled);2 person hand held assist Gait Pattern/deviations: Wide base of support;Trunk flexed;Antalgic;Decreased weight shift to left;Decreased stride length;Step-through pattern Gait velocity: reduced Gait velocity interpretation: Below normal speed for age/gender    Stairs            Wheelchair Mobility    Modified Rankin (Stroke Patients Only)       Balance Overall balance assessment: Needs assistance Sitting-balance support: Feet supported Sitting balance-Leahy Scale: Good     Standing balance support: Bilateral upper extremity supported Standing balance-Leahy Scale: Fair                               Pertinent Vitals/Pain Pain Assessment: 0-10 Pain Score: 8  Pain Location: L hip and leg with numbness Pain Descriptors / Indicators: Cramping;Aching Pain Intervention(s): Monitored during session;Premedicated before session;Limited activity within patient's tolerance;Repositioned;Patient requesting pain meds-RN notified    Home Living Family/patient expects to be discharged to:: Private residence Living Arrangements: Spouse/significant other Available Help at Discharge: Family;Available 24 hours/day Type of Home: House Home Access: Level entry     Home Layout: Two level;Able to live on main level with bedroom/bathroom Home Equipment: Gilford Rile - 2 wheels      Prior Function Level of Independence: Independent         Comments: denies a fall history     Hand Dominance        Extremity/Trunk Assessment   Upper Extremity Assessment: Defer to OT evaluation  Lower Extremity Assessment: Overall WFL for tasks assessed      Cervical / Trunk Assessment: Normal  Communication   Communication: No  difficulties  Cognition Arousal/Alertness: Awake/alert Behavior During Therapy: WFL for tasks assessed/performed Overall Cognitive Status: Within Functional Limits for tasks assessed                      General Comments General comments (skin integrity, edema, etc.): Pt is up to walk with brace applied in sitting, needs assist from therapists to apply then to adjust.  He is not able to reach back to apply due to back precautions    Exercises        Assessment/Plan    PT Assessment Patient needs continued PT services  PT Diagnosis Difficulty walking;Acute pain;Other (comment) (LLE numbness)   PT Problem List Decreased strength;Decreased range of motion;Decreased activity tolerance;Decreased balance;Decreased mobility;Decreased coordination;Decreased knowledge of use of DME;Decreased safety awareness;Cardiopulmonary status limiting activity;Decreased knowledge of precautions;Obesity;Pain;Decreased skin integrity;Impaired sensation  PT Treatment Interventions DME instruction;Gait training;Stair training;Functional mobility training;Therapeutic activities;Balance training;Therapeutic exercise;Neuromuscular re-education;Patient/family education   PT Goals (Current goals can be found in the Care Plan section) Acute Rehab PT Goals Patient Stated Goal: to be in much less pain PT Goal Formulation: With patient Time For Goal Achievement: 12/16/15 Potential to Achieve Goals: Good    Frequency Min 4X/week   Barriers to discharge Other (comment) (mod assist to sit up from bedside, pt is heavy to assist)      Co-evaluation PT/OT/SLP Co-Evaluation/Treatment: Yes Reason for Co-Treatment: Complexity of the patient's impairments (multi-system involvement);For patient/therapist safety PT goals addressed during session: Mobility/safety with mobility;Balance         End of Session Equipment Utilized During Treatment: Back brace Activity Tolerance: Patient limited by pain;Patient  tolerated treatment well Patient left: in chair;with call bell/phone within reach Nurse Communication: Mobility status    Functional Assessment Tool Used: clinical judgment Functional Limitation: Mobility: Walking and moving around Mobility: Walking and Moving Around Current Status [id number]): At least 40 percent but less than 60 percent impaired, limited or restricted Mobility: Walking and Moving Around Goal Status [phone]): At least 1 percent but less than 20 percent impaired, limited or restricted    Time: 1105-1150 PT Time Calculation (min) (ACUTE ONLY): 45 min   Charges:   PT Evaluation $PT Eval Moderate Complexity: 1 Procedure     PT G Codes:   PT G-Codes **NOT FOR INPATIENT CLASS** Functional Assessment Tool Used: clinical judgment Functional Limitation: Mobility: Walking and moving around Mobility: Walking and Moving Around Current Status [id number]): At least 40 percent but less than 60 percent impaired, limited or restricted Mobility: Walking and Moving Around Goal Status [phone]): At least 1 percent but less than 20 percent impaired, limited or restricted    Ramond Dial 12/02/2015, 12:28 PM    Mee Hives, PT MS Acute Rehab Dept. Number: [place] [facility] [place]

## 2015-12-02 NOTE — Progress Notes (Signed)
Discharge teaching and instructions reviewed. VSS. Pt discharging home via wife.

## 2015-12-02 NOTE — Care Management Note (Signed)
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 After the procedure, the pateitn went into afib with RVR and cardiology was consulted. Plan for d/c home with wife and [place].                   Action/Plan: CM did provide pt with agency list and pt chose [facility] for [facility] Services and DME. Referral made for [place] and [place] to begin within 24-48 hours post d/c. DME RW to be delivered to room before d/c. Staff RN aware that pt will need orders.    Expected Discharge Date:                  Expected Discharge Plan:  [place]  In-House Referral:  NA  Discharge planning Services  CM Consult  Post Acute Care Choice:  Home Health Choice offered to:  Patient  DME Arranged:  Walker rolling DME Agency:  [place]:  PT [facility] Agency:  [place]  Status of Service:  Completed, signed off  Medicare Important Message Given:    Date Medicare IM Given:    Medicare IM give by:    Date Additional Medicare IM Given:    Additional Medicare Important Message give by:     If discussed at [place] of Stay Meetings, dates discussed:    Additional Comments:  Bethena Roys, RN 12/02/2015, 12:38 PM

## 2015-12-02 NOTE — Care Management Obs Status (Signed)
[place] NOTIFICATION   Patient Details  Name: [name] [name] MRN: [id number] Date of Birth: 09-22-1945   Medicare Observation Status Notification Given:  Yes    Bethena Roys, RN 12/02/2015, 12:02 PM

## 2015-12-02 NOTE — Progress Notes (Signed)
Patient ID: [name] [name], male   DOB: 11-22-1945, 70 y.o.   MRN: [id number]    Subjective: 1 Day Post-Op Procedure(s) (LRB): LEFT L3-L4 FAR LATERAL DISCECTOMY (WILTSE APPROACH)     (1 LEVEL) (Left) Patient reports pain as 6 on 0-10 scale.   Denies CP or SOB.  Voiding without difficulty. Positive flatus. No more vomiting.  Leg pain is decreased from earlier in the day Objective: Vital signs in last 24 hours: Temp:  [98.2 F (36.8 C)-98.5 F (36.9 C)] 98.2 F (36.8 C) (06/02 1240) Pulse Rate:  [74-89] 89 (06/02 1240) Resp:  [16-18] 18 (06/02 1240) BP: (139-171)/(66-85) 165/75 mmHg (06/02 1240) SpO2:  [95 %-98 %] 95 % (06/02 1240)  Intake/Output from previous day: 06/01 0701 - 06/02 0700 In: 3340 [P.O.:340; I.V.:3000] Out: 3575 [Urine:3400; Emesis/NG output:75; Blood:100] Intake/Output this shift: Total I/O In: 1817.3 [P.O.:480; I.V.:1337.3] Out: 400 [Urine:400]  Labs: No results for input(s): HGB in the last 72 hours. No results for input(s): WBC, RBC, HCT, PLT in the last 72 hours. No results for input(s): NA, K, CL, CO2, BUN, CREATININE, GLUCOSE, CALCIUM in the last 72 hours. No results for input(s): LABPT, INR in the last 72 hours.  Physical Exam: Neurologically intact ABD soft Sensation intact distally Dorsiflexion/Plantar flexion intact Incision: no drainage Compartment soft No radicular pain noted with SLR - pain reports pain in hamstring.  Assessment/Plan: 1 Day Post-Op Procedure(s) (LRB): LEFT L3-L4 FAR LATERAL DISCECTOMY (WILTSE APPROACH)     (1 LEVEL) (Left) The pt does not want ECHO.  His liver and kidney failure was medication induced.  He does not wish to be put on any new cardiac medications.  Cardiology has signed off.  PT recommended a walker and Home PT - this has been ordered Pt would like to be discharged home.  He says he has lots of assistance at home. Put in DC order after consult with Dr. Rolena [name] Medications are printed for the pt to refill on teh  way home   [name] [name], [name] [name] for Dr. Melina [name] [name] [name] [facility] [phone] 12/02/2015, 3:39 PM

## 2015-12-02 NOTE — Progress Notes (Signed)
    Subjective: Procedure(s) (LRB): LEFT L3-L4 FAR LATERAL DISCECTOMY (WILTSE APPROACH)     (1 LEVEL) (Left) 1 Day Post-Op  Patient reports pain as 4 on 0-10 scale.  Reports fatigue and discomfort in left leg pain. Different from pre-op. reports incisional back pain   Positive void Negative bowel movement Positive flatus Negative chest pain or shortness of breath  Objective: Vital signs in last 24 hours: Temp:  [97.6 F (36.4 C)-98.5 F (36.9 C)] 98.5 F (36.9 C) (06/02 0400) Pulse Rate:  [40-157] 84 (06/02 0400) Resp:  [14-24] 18 (06/02 0400) BP: (134-176)/(55-115) 143/66 mmHg (06/02 0400) SpO2:  [93 %-98 %] 96 % (06/02 0400)  Intake/Output from previous day: 06/01 0701 - 06/02 0700 In: 3340 [P.O.:340; I.V.:3000] Out: 3575 [Urine:3400; Emesis/NG output:75; Blood:100]  Labs: No results for input(s): WBC, RBC, HCT, PLT in the last 72 hours. No results for input(s): NA, K, CL, CO2, BUN, CREATININE, GLUCOSE, CALCIUM in the last 72 hours. No results for input(s): LABPT, INR in the last 72 hours.  Physical Exam: Neurologically intact ABD soft Neurovascular intact Incision: dressing C/D/I Compartment soft NSR at present  Assessment/Plan: Patient stable  xrays n/a Continue mobilization with physical therapy Continue care  Advance diet Up with therapy D/C IV fluids  Awaiting final clearance from cardiology.  Patient has remained in NSR overnight Nausea and vomiting resolved.   I am concerned about ongoing radicular type leg pain especially given the significant nausea and vomiting which could lead to recurrence Fortunately the leg pain is not as severe or similar in nature as his pre-op pain.  Will monitor.   [name], MD [place] [phone]

## 2015-12-02 NOTE — Progress Notes (Signed)
Subjective: No chest pain, having back pain and leg pain.    Objective: Vital signs in last 24 hours: Temp:  [97.6 F (36.4 C)-98.5 F (36.9 C)] 98.5 F (36.9 C) (06/02 0400) Pulse Rate:  [40-157] 84 (06/02 0400) Resp:  [14-24] 18 (06/02 0400) BP: (134-176)/(55-115) 143/66 mmHg (06/02 0400) SpO2:  [93 %-98 %] 96 % (06/02 0400) Weight change:  Last BM Date: 11/30/15 Intake/Output from previous day: -235 06/01 0701 - 06/02 0700 In: 3340 [P.O.:340; I.V.:3000] Out: 3575 [Urine:3400; Emesis/NG output:75; Blood:100] Intake/Output this shift:    PE: General:Pleasant affect, NAD Skin:Warm and dry, brisk capillary refill HEENT:normocephalic, sclera clear, mucus membranes moist Heart:S1S2 RRR without murmur, gallup, rub or click Lungs:clear without rales, rhonchi, or wheezes [id number], mildly tender over liver, + BS, do not palpate liver spleen or masses Ext:tr lower ext edema, 2+ pedal pulses, 2+ radial pulses Neuro:alert and oriented X 3, MAE, follows commands, + facial symmetry Tele:  SR    Lab Results: No results for input(s): WBC, HGB, HCT, PLT in the last 72 hours. BMET No results for input(s): NA, K, CL, CO2, GLUCOSE, BUN, CREATININE, CALCIUM in the last 72 hours.  Invalid input(s): MAGNESIUM No results for input(s): TROPONINI in the last 72 hours.  Invalid input(s): CK, MB  No results found for: CHOL, HDL, LDLCALC, LDLDIRECT, TRIG, CHOLHDL Lab Results  Component Value Date   HGBA1C 5.8* 12/01/2015     No results found for: TSH  Hepatic Function Panel No results for input(s): PROT, ALBUMIN, AST, ALT, ALKPHOS, BILITOT, BILIDIR, IBILI in the last 72 hours. No results for input(s): CHOL in the last 72 hours. No results for input(s): PROTIME in the last 72 hours.     Studies/Results: Dg Lumbar Spine 2-3 Views  12/01/2015  CLINICAL DATA:  Lumbar spine surgery. EXAM: LUMBAR SPINE - 2-3 VIEW COMPARISON:  No recent prior. FINDINGS: Lumbar vertebra  numbered with the lowest segmented appearing lumbar shaped vertebra on lateral view as L5. Metallic markers are noted posteriorly at L3 and L4. Metallic probe noted at the L3-L4 disc space posteriorly . IMPRESSION: Metallic probe noted at the L3-L4 disc space posteriorly. Electronically Signed   By: Marcello Moores  Register   On: 12/01/2015 08:41   Dg Abd Portable 1v  12/01/2015  CLINICAL DATA:  Vomiting fallen surgery. Evaluate for bowel obstruction. EXAM: PORTABLE ABDOMEN - 1 VIEW COMPARISON:  None. FINDINGS: Normal bowel gas pattern. There is no bowel dilation to suggest obstruction or significant adynamic ileus. Soft tissues are unremarkable. IMPRESSION: 1. No acute findings. No evidence of bowel obstruction or significant adynamic ileus. Electronically Signed   By: Lajean Manes M.D.   On: 12/01/2015 14:37    Medications: I have reviewed the patient's current medications. Scheduled Meds: . metoprolol  5 mg Intravenous 6 X Daily  . mycophenolate  180 mg Oral BID  . sodium chloride flush  3 mL Intravenous Q12H  . tacrolimus  1 mg Oral BID   Continuous Infusions: . sodium chloride 250 mL (12/01/15 1615)  . lactated ringers 85 mL/hr at 12/01/15 1816   PRN Meds:.menthol-cetylpyridinium **OR** phenol, methocarbamol **OR** methocarbamol (ROBAXIN)  IV, morphine injection, ondansetron (ZOFRAN) IV, oxyCODONE, sodium chloride flush  Assessment/Plan: Active Problems:   Back pain   Atrial fibrillation with RVR ([place])   Morbid obesity ([place])     70 y.o. 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 After the procedure, the pateitn went into afib with RVR and cardiology was consulted.    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 He has been intolerant to Lipitor and Crestor due to gout. BP has been noted to be elevated on multiple occasions during   Patient told me that his liver and kidney failure was medication induced by an antibiotic to treat cellulitis. He is very wary of starting any new medication. He has had a previous sleep study which was negative for OSA.  Newly diagnosed afib with RVR: CHADSVASC score of at least 1 for age.  Prob 2 with HTN This was in post op period but should be followed long term for recurrence    2D ECHO ordered not yet done.-  to assess cardiac structure and function-- Pt does not want.   on IV lopressor for rate controll  Pt does not want to take PO He has had a previous sleep study which was negative for OSA.  Converted to SR yesterday pm   Hyperglycemia: will get HgA1c for further risk stratification.   HTN: BP hs been up here  He is in pain  WIll need to be followed   Hx of liver and kidney transplant: on long term immunosuppressive therapy.   Hyperlipidemia with hypertriglyceridemia: Patient has been intolerant of statins in the past due to myopathy. He will continue Lovaza. Plan was to attempt to control hypertriglyceridemia through dietary modification and exercise.  Morbid obesity: he has been working on weight loss   Time spent with pt. :15 minutes. Cecilie Kicks  Nurse Practitioner Certified Pager [phone] or after 5pm and on weekends call [phone] 12/02/2015, 8:34 AM   Patient seen and examined  I have amended note above to reflect my findings On exam :  Lungs are clear  Cardia exam RRR  No S3  Ext with tr edema  Pt in a lot of pain.   Pt converted to SR  Afib may be purely post op problem  Pt will need to be followed as outpt He does not want lopressor  Does not want to stay for echo BP is up and down.  This will need to be followed.   Pt eager to leave   No  further recs  Pt will f/u at [place] wants to go hoem

## 2015-12-02 NOTE — Op Note (Signed)
NAMECLAUDY, SOTO                 ACCOUNT NO.:  [id number]  MEDICAL RECORD NO.:  [id number]  LOCATION:  [id number]                        FACILITY:  [facility]  PHYSICIAN:  Axiel Fjeld D. Rolena Infante, M.D. 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  OPERATIVE PROCEDURE:  Far lateral L3-4 diskectomy (Wiltse approach).  FIRST ASSISTANT:  [facility].  HISTORY:  This is a very pleasant 70 year old gentleman who has been having progressive, debilitating neuropathic left leg pain in the L3 distribution.  Imaging demonstrated a far lateral disk osteophyte complex causing severe foraminal and extraforaminal stenosis.  As a result of the failure of conservative management, we elected to proceed with surgery.  All appropriate risks, benefits and alternatives were discussed with the patient and consent was obtained.  OPERATIVE NOTE:  The patient was brought to the operating room and placed supine on the operating table.  After successful induction of general anesthesia and endotracheal intubation, TEDs, SCDs were applied, he was turned prone onto the Wilson frame and all bony prominences were well padded.  The back was then prepped and draped in standard fashion. Time-out was taken confirming the patient, procedure, affected extremity and all other important pertinent data.  Once that was done, two needles were placed into the back to mark the L3-4 disk space.  Intraoperative x- ray confirmed that I was over the L3-4 disk and L3 foramen.  I then marked the incision site approximately 1-1/2 fingerbreadths lateral to the midline.  This was infiltrated with 0.25% Marcaine with epi and then I made an incision.  I sharply dissected through the deep tissue  down to the deep fascia.  The deep fascia was sharply incised and I bluntly dissected through the paraspinal muscles until I could palpate the L2-3 facet.  Once this was done, I then used a Cobb to gently remove the remaining paraspinal muscles to expose the entire facet complex and the pars.  Once I had the posterolateral aspect of the spine exposed, I then placed a Penfield 4 at the lateral edge of the pars and then took a second x-ray.  I confirmed again that I was at the appropriate level (L3 foramen).  Once this was done, the [facility] self-retaining tubular retraction device was then inserted to allow better visualization.  I then used a 2 and 3-mm Kerrison punch to resect the lateral portion of the pars.  I then identified the L3 exiting nerve root.  Once I had an adequate bony decompression, I then placed a Penfield 4 inferior to the nerve just at the superior margin of the L3-4 disk itself.  I took a final x-ray and again confirmed that I was at the appropriate neural foramen (L3).  I then used my nerve hook to mobilize the soft disk protrusion/herniation that was underneath the nerve root.  This was delivered and removed with a micropituitary rongeur.  I then coagulated the large epidural bleeding vein and then continued my  inspection.  I identified the disk space and performed an annulotomy with a 15-blade scalpel.  I then used a micropituitary rongeur to remove some of the protrusion of the disk and then used an Epstein curette to debride the osteophyte in the foramen.  At this point, the nerve root was now freely mobile.  I could palpate superiorly with a Penfield 4 down the inferior aspect of the L3 pedicle.  I could totally visualize the L3 nerve root. I then re-evaluated the MRI and confirmed where the maximal amount of neural compression was located.  I checked again just underneath the nerve root where the maximal amount of pressure was and I was able to easily sweep of  the [facility] elevator underneath the nerve root.  I was able to pass it up into the lateral recess and out the remaining portion in the extraforaminal region.  At this point, I had an adequate foraminal decompression and diskectomy.  I irrigated this wound copiously with normal saline and obtained and maintained hemostasis using bipolar electrocautery and FloSeal.  I re-irrigated and then placed a thrombin-soaked Gelfoam patty over the laminotomy site and then closed the deep fascia in a layered fashion.  This was done with interrupted #1 Vicryl sutures, 2-0 Vicryl sutures and a 3-0 Monocryl. Steri-Strips and dry dressing were applied and the patient was ultimately extubated and transferred to the PACU without incident.  At the end of the case, all needles and sponge counts were correct.  There were no adverse intraoperative events.  First assistant was [employer], my PA.     Sian Rockers D. Rolena Infante, M.D.     DDB/MEDQ  D:  12/01/2015  T:  12/02/2015  Job:  [id number]

## 2015-12-13 NOTE — Discharge Summary (Signed)
Physician Discharge Summary  Patient ID: [name] [name] MRN: [id number] DOB/AGE: May 20, 1946 70 y.o.  Admit date: 12/01/2015 Discharge date: 12/13/2015  Admission Diagnoses:  L3-4 Disc Herniation with LLE radiculopathy  Discharge Diagnoses:  Active Problems:   Back pain   Atrial fibrillation with RVR (HCC)   Morbid obesity (H[facility])   Past Medical History  Diagnosis Date  . Gout   . End stage renal disease ([place])   . Obesity   . Kidney transplant recipient   . Liver transplant recipient [facility])   . Nephrolithiasis   . Benign neoplasm of colon   . Neuromuscular disorder ([place])   . Arthritis     spine, hips    Surgeries: Procedure(s): LEFT L3-L4 FAR LATERAL DISCECTOMY (WILTSE APPROACH)     (1 LEVEL) on 12/01/2015   Consultants (if any): Treatment Team:  Rounding [facility]cardiology, MD  Discharged Condition: Improved  Hospital Course: ALIOUNE BLAIZE is an 70 y.o. male who was admitted 12/01/2015 with a diagnosis of L3-4 Disc Herniation and LLE radiculopathy and went to the operating room on 12/01/2015 and underwent the above named procedures.  The pt was discharged on 12/02/15.   He was given perioperative antibiotics:  Anti-infectives    Start     Dose/Rate Route Frequency Ordered Stop   12/01/15 1930  vancomycin (VANCOCIN) 2,000 mg in sodium chloride 0.9 % 500 mL IVPB     2,000 mg 250 mL/hr over 120 Minutes Intravenous  Once 12/01/15 1623 12/01/15 2233    .  He was given sequential compression devices, early ambulation, and Dayven for DVT prophylaxis.  He benefited maximally from the hospital stay and there were no complications.    Recent vital signs:  Filed Vitals:   12/02/15 1240 12/02/15 1556  BP: 165/75 157/81  Pulse: 89 96  Temp: 98.2 F (36.8 C) 98.3 F (36.8 C)  Resp: 18 18    Recent laboratory studies:  Lab Results  Component Value Date   HGB 15.9 11/22/2015   Lab Results  Component Value Date   WBC 4.2 11/22/2015   PLT 103* 11/22/2015   No results found for:  INR Lab Results  Component Value Date   NA 138 11/22/2015   K 4.1 11/22/2015   CL 107 11/22/2015   CO2 27 11/22/2015   BUN 13 11/22/2015   CREATININE 1.16 11/22/2015   GLUCOSE 109* 11/22/2015    Discharge Medications:     Medication List    TAKE these medications        Fish Oil 1000 MG Caps  Take 1,000 mg by mouth daily.     methocarbamol 500 MG tablet  Commonly known as:  ROBAXIN  Take 1 tablet (500 mg total) by mouth 3 (three) times daily as needed for muscle spasms.     multivitamin with minerals Tabs tablet  Take 1 tablet by mouth daily.     mycophenolate 180 MG EC tablet  Commonly known as:  MYFORTIC  Take 180 mg by mouth 2 (two) times daily.     ondansetron 4 MG tablet  Commonly known as:  ZOFRAN  Take 1 tablet (4 mg total) by mouth every 8 (eight) hours as needed for nausea or vomiting.     oxyCODONE-acetaminophen 10-325 MG tablet  Commonly known as:  PERCOCET  Take 1 tablet by mouth every 4 (four) hours as needed for pain.     tacrolimus 1 MG capsule  Commonly known as:  PROGRAF  Take 1 mg by mouth 2 (two)  times daily.        Diagnostic Studies: Dg Lumbar Spine 2-3 Views  12/01/2015  CLINICAL DATA:  Lumbar spine surgery. EXAM: LUMBAR SPINE - 2-3 VIEW COMPARISON:  No recent prior. FINDINGS: Lumbar vertebra numbered with the lowest segmented appearing lumbar shaped vertebra on lateral view as L5. Metallic markers are noted posteriorly at L3 and L4. Metallic probe noted at the L3-L4 disc space posteriorly . IMPRESSION: Metallic probe noted at the L3-L4 disc space posteriorly. Electronically Signed   By: Marcello Moores  Register   On: 12/01/2015 08:41   Dg Abd Portable 1v  12/01/2015  CLINICAL DATA:  Vomiting fallen surgery. Evaluate for bowel obstruction. EXAM: PORTABLE ABDOMEN - 1 VIEW COMPARISON:  None. FINDINGS: Normal bowel gas pattern. There is no bowel dilation to suggest obstruction or significant adynamic ileus. Soft tissues are unremarkable. IMPRESSION: 1. No  acute findings. No evidence of bowel obstruction or significant adynamic ileus. Electronically Signed   By: Lajean Manes M.D.   On: 12/01/2015 14:37    Disposition: 01-Home or Self Care        Follow-up Information    Follow up with Dahlia Bailiff, MD. Schedule an appointment as soon as possible for a visit in 2 weeks.   Specialty:  Orthopedic Surgery   Why:  If symptoms worsen, For suture removal, For wound re-check   Contact information:   [address] [place] [address] [id number]        Signed: Valinda Hoar 12/13/2015, 12:58 PM

## 2016-09-27 ENCOUNTER — Other Ambulatory Visit: Payer: Self-pay | Admitting: Orthopedic Surgery

## 2016-09-27 DIAGNOSIS — M961 Postlaminectomy syndrome, not elsewhere classified: Secondary | ICD-10-CM

## 2016-10-03 ENCOUNTER — Ambulatory Visit
Admission: RE | Admit: 2016-10-03 | Discharge: 2016-10-03 | Disposition: A | Discharge status: Home / Self Care | Payer: Medicare Other | Source: Ambulatory Visit | Attending: Orthopedic Surgery | Admitting: Orthopedic Surgery

## 2016-10-03 DIAGNOSIS — M961 Postlaminectomy syndrome, not elsewhere classified: Secondary | ICD-10-CM

## 2016-10-03 MED ORDER — METHYLPREDNISOLONE ACETATE 40 MG/ML INJ SUSP (RADIOLOG
120.0000 mg | Freq: Once | INTRAMUSCULAR | Status: AC
  Administered 2016-10-03: 120 mg via EPIDURAL

## 2016-10-03 MED ORDER — IOPAMIDOL (ISOVUE-M 200) INJECTION 41%
1.0000 mL | Freq: Once | INTRAMUSCULAR | Status: AC
  Administered 2016-10-03: 1 mL via EPIDURAL

## 2016-10-03 NOTE — Discharge Instructions (Signed)

## 2016-10-11 ENCOUNTER — Other Ambulatory Visit: Payer: Self-pay | Admitting: Orthopedic Surgery

## 2016-10-12 ENCOUNTER — Ambulatory Visit: Payer: Self-pay | Admitting: Physician Assistant

## 2016-10-15 ENCOUNTER — Encounter (HOSPITAL_COMMUNITY)
Admission: RE | Admit: 2016-10-15 | Discharge: 2016-10-15 | Disposition: A | Discharge status: Home / Self Care | Payer: Medicare Other | Source: Ambulatory Visit | Attending: Orthopedic Surgery | Admitting: Orthopedic Surgery

## 2016-10-15 ENCOUNTER — Encounter (HOSPITAL_COMMUNITY): Payer: Self-pay

## 2016-10-15 DIAGNOSIS — Z79899 Other long term (current) drug therapy: Secondary | ICD-10-CM

## 2016-10-15 DIAGNOSIS — Z01812 Encounter for preprocedural laboratory examination: Secondary | ICD-10-CM

## 2016-10-15 DIAGNOSIS — Z6841 Body Mass Index (BMI) 40.0 and over, adult: Secondary | ICD-10-CM | POA: Insufficient documentation

## 2016-10-15 DIAGNOSIS — M5136 Other intervertebral disc degeneration, lumbar region: Secondary | ICD-10-CM | POA: Insufficient documentation

## 2016-10-15 HISTORY — DX: Personal history of urinary calculi: Z87.442

## 2016-10-15 HISTORY — DX: Unspecified atrial fibrillation: I48.91

## 2016-10-15 LAB — COMPREHENSIVE METABOLIC PANEL
ALT: 18 U/L (ref 17–63)
AST: 20 U/L (ref 15–41)
Albumin: 3.9 g/dL (ref 3.5–5.0)
Alkaline Phosphatase: 88 U/L (ref 38–126)
Anion gap: 7 (ref 5–15)
BUN: 15 mg/dL (ref 6–20)
CHLORIDE: 107 mmol/L (ref 101–111)
CO2: 26 mmol/L (ref 22–32)
Calcium: 8.9 mg/dL (ref 8.9–10.3)
Creatinine, Ser: 1.29 mg/dL — ABNORMAL HIGH (ref 0.61–1.24)
GFR, EST NON AFRICAN AMERICAN: 55 mL/min — AB (ref >60)
Glucose, Bld: 113 mg/dL — ABNORMAL HIGH (ref 65–99)
POTASSIUM: 4.4 mmol/L (ref 3.5–5.1)
SODIUM: 140 mmol/L (ref 135–145)
Total Bilirubin: 0.8 mg/dL (ref 0.3–1.2)
Total Protein: 6.7 g/dL (ref 6.5–8.1)

## 2016-10-15 LAB — CBC
HCT: 49.2 % (ref 39.0–52.0)
Hemoglobin: 16.7 g/dL (ref 13.0–17.0)
MCH: 29.8 pg (ref 26.0–34.0)
MCHC: 33.9 g/dL (ref 30.0–36.0)
MCV: 87.7 fL (ref 78.0–100.0)
PLATELETS: 98 10*3/uL — AB (ref 150–400)
RBC: 5.61 MIL/uL (ref 4.22–5.81)
RDW: 13.3 % (ref 11.5–15.5)
WBC: 4.2 10*3/uL (ref 4.0–10.5)

## 2016-10-15 LAB — TYPE AND SCREEN
ABO/RH(D): A POS
ANTIBODY SCREEN: NEGATIVE

## 2016-10-15 LAB — SURGICAL PCR SCREEN
MRSA, PCR: NEGATIVE
STAPHYLOCOCCUS AUREUS: NEGATIVE

## 2016-10-15 LAB — ABO/RH: ABO/RH(D): A POS

## 2016-10-15 NOTE — Pre-Procedure Instructions (Signed)
    [name] [name]  10/15/2016    Your procedure is scheduled on Tuesday, April 18.  Report to [facility] Admitting at 8:15 AM               Your surgery or procedure is scheduled for 10:15   Call this number if you have problems the morning of surgery: [phone]               For any other questions, please call [phone], Monday - Friday 8 AM - 4 PM.   Remember:  Do not eat food or drink liquids after midnight Tuesday, April 17.  Take these medicines the morning of surgery with A SIP OF WATER :gabapentin (NEURONTIN), mycophenolate (MYFORTIC), tacrolimus (PROGRAF).              May take methocarbamol (ROBAXIN), oxyCODONE (OXY IR/ROXICODONE) if needed.               STOP taking Aspirin , Aspirin Products (Goody Powder, Excedrin Migraine), Ibuprofen (Advil), Naproxen (Aleve), Vitamins and Herbal Products (ie Fish Oil).   Do not wear jewelry, make-up or nail polish.  Do not wear lotions, powders, or perfumes, or deodorant.  Do not shave 48 hours prior to surgery.  Men may shave face and neck.  Do not bring valuables to the hospital.  [facility] is not responsible for any belongings or valuables.  Contacts, dentures or bridgework may not be worn into surgery.  Leave your suitcase in the car.  After surgery it may be brought to your room.  For patients admitted to the hospital, discharge time will be determined by your treatment team.  Special instructions:  Review  [place] - Preparing For Surgery.  Please read over the following fact sheets that you were given. [place]- Preparing For Surgery and Patient Instructions for Mupirocin Application, Coughing and Deep Breathing, Pain Booklet and Surgical Site Infections

## 2016-10-16 ENCOUNTER — Encounter (HOSPITAL_COMMUNITY): Payer: Self-pay

## 2016-10-16 MED ORDER — VANCOMYCIN HCL 10 G IV SOLR
1500.0000 mg | INTRAVENOUS | Status: AC
  Administered 2016-10-17: 1500 mg via INTRAVENOUS
  Filled 2016-10-16: qty 1500

## 2016-10-16 NOTE — Progress Notes (Signed)
Anesthesia Chart Review: Patient is a 71 year old male scheduled for XLIF L3-4 on 10/17/16 and posterior fusion possible revision decompression L3-4 on 10/18/16 by Dr. Rolena Infante. PAT was on 10/15/16 (case posted on Thursday 10/11/16 at 1234, PAT scheduled called patient on Friday 10/12/16, PAT scheduled for Monday 10/15/16; Anesthesia received chart for review on Tuesday 10/16/16: OR scheduled for 10/17/16 and 10/18/16).  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 Patient was seen by cardiologist Dr. Dorris Carnes after patient developed afib with RVR following back surgery. Patient was asymptomatic. Treated with IV lopressor. Echo ordered to assess cardiac structure and function, but patient declined. He did not want Lopressor. Afib could be purely post-operative problem but recommended he shoulde be followed long term for recurrence. Patient wanted follow-up with [facility]. He reported previous sleep study as negative.  Patient was seen by Dr. Arlyss Repress ([facility]; phone [phone]) on 10/11/16 for preoperative clearance for spinal fusion surgery. He wrote, "he is medically cleared to have surgery on lumbar spine. He is in significant pain and quality of life certainly diminished. He is above average risk for complications due to his morbid obesity and probably his state of being immunocompromised. However he is stable. There is been no evidence of recurrence of hepatocellular carcinoma. His transplanted kidney is working well. He has no infection at this time. Counseled to try to lose weight after his surgery by eating less, low-fat diet."   He is followed at [facility], last visit 08/23/16.  Meds include vitamin D, colchicine, gabapentin, Robaxin, multivitamin, OxyIR, Prograf, Myfortic.  EKG  12/02/15: NSR. Afib with RVR has resolved when compared to 12/01/15 tracing.  CXR 08/23/16 [facility]; Care Everywhere): CONCLUSIONS: Unchanged elevation of the right hemidiaphragm. No acute airspace disease or pulmonary edema. No pleural effusion or pneumothorax. Stable cardiomediastinal silhouette. Degenerative disc disease.  Preoperative labs noted. Cr 1.29. AST/ALT WNL. H/H 16.7/49.2. PLT 98K (previously 103K on 11/22/15). I left a voice message for Margarita Grizzle at Dr. Rolena Infante office regarding PLT count, although number is essentially unchanged from when Dr. Rolena Infante did prior back surgery on June 2017. A T&S has already been done.   Above reviewed with anesthesiologist Dr. Nyoka Cowden earlier today. Preference was to speak with Dr. Gaylan Gerold to clarify that nothing further was recommended prior to surgery (in light of brief post-operative afib after 12/01/15 surgery). Just after speaking with Dr. Nyoka Cowden, I called Dr. Marty Heck office around 12:35 PM and again at 1:35 PM and they were closed for lunch. I called again around 2:05 PM and the line was busy. I tried again around 2:35 PM and the message said the office was closed. I paged and left a voice message for on-call physician for Dr. Gaylan Gerold ("Dr. Rondel Oh"), but have not gotten a return phone call. (My hope was to see if she could tell me a way to get in touch with Dr. Gaylan Gerold.) I have updated Dr. Nyoka Cowden that I have not been able to get any additional input regarding Dr. Marty Heck medical clearance. Patient will be evaluated by his anesthesiologist on the day of surgery.  [facility]/Anesthesiology Phone [phone] 10/16/2016 3:50 PM

## 2016-10-17 ENCOUNTER — Inpatient Hospital Stay (HOSPITAL_COMMUNITY): Payer: Medicare Other | Admitting: Anesthesiology

## 2016-10-17 ENCOUNTER — Inpatient Hospital Stay (HOSPITAL_COMMUNITY)
Admission: RE | Admit: 2016-10-17 | Discharge: 2016-10-19 | DRG: 460 | Disposition: A | Discharge status: Home / Self Care | Payer: Medicare Other | Source: Ambulatory Visit | Attending: Orthopedic Surgery | Admitting: Orthopedic Surgery

## 2016-10-17 ENCOUNTER — Inpatient Hospital Stay (HOSPITAL_COMMUNITY): Payer: Medicare Other | Admitting: Vascular Surgery

## 2016-10-17 ENCOUNTER — Encounter (HOSPITAL_COMMUNITY)
Admission: RE | Disposition: A | Discharge status: Home / Self Care | Payer: Self-pay | Source: Ambulatory Visit | Attending: Orthopedic Surgery

## 2016-10-17 ENCOUNTER — Inpatient Hospital Stay (HOSPITAL_COMMUNITY): Payer: Medicare Other

## 2016-10-17 ENCOUNTER — Encounter (HOSPITAL_COMMUNITY): Payer: Self-pay | Admitting: Certified Registered Nurse Anesthetist

## 2016-10-17 DIAGNOSIS — M961 Postlaminectomy syndrome, not elsewhere classified: Principal | ICD-10-CM | POA: Diagnosis present

## 2016-10-17 DIAGNOSIS — Z940 Kidney transplant status: Secondary | ICD-10-CM | POA: Diagnosis not present

## 2016-10-17 DIAGNOSIS — Z944 Liver transplant status: Secondary | ICD-10-CM

## 2016-10-17 DIAGNOSIS — M5116 Intervertebral disc disorders with radiculopathy, lumbar region: Secondary | ICD-10-CM | POA: Diagnosis present

## 2016-10-17 DIAGNOSIS — Z79899 Other long term (current) drug therapy: Secondary | ICD-10-CM | POA: Diagnosis not present

## 2016-10-17 DIAGNOSIS — M549 Dorsalgia, unspecified: Secondary | ICD-10-CM | POA: Diagnosis present

## 2016-10-17 DIAGNOSIS — Z419 Encounter for procedure for purposes other than remedying health state, unspecified: Secondary | ICD-10-CM

## 2016-10-17 DIAGNOSIS — M109 Gout, unspecified: Secondary | ICD-10-CM | POA: Diagnosis present

## 2016-10-17 DIAGNOSIS — I82409 Acute embolism and thrombosis of unspecified deep veins of unspecified lower extremity: Secondary | ICD-10-CM | POA: Diagnosis not present

## 2016-10-17 HISTORY — PX: ANTERIOR LAT LUMBAR FUSION: SHX1168

## 2016-10-17 HISTORY — PX: POSTERIOR LUMBAR FUSION: SHX6036

## 2016-10-17 HISTORY — DX: Low back pain, unspecified: M54.50

## 2016-10-17 HISTORY — DX: Personal history of other medical treatment: Z92.89

## 2016-10-17 HISTORY — DX: Other chronic pain: G89.29

## 2016-10-17 HISTORY — DX: Low back pain: M54.5

## 2016-10-17 SURGERY — ANTERIOR LATERAL LUMBAR FUSION 1 LEVEL
Anesthesia: General | Site: Flank

## 2016-10-17 MED ORDER — SUCCINYLCHOLINE CHLORIDE 20 MG/ML IJ SOLN
INTRAMUSCULAR | Status: DC | PRN
  Administered 2016-10-17: 100 mg via INTRAVENOUS

## 2016-10-17 MED ORDER — SODIUM CHLORIDE 0.9% FLUSH: 3.0000 mL | INTRAVENOUS | Status: DC | PRN

## 2016-10-17 MED ORDER — METHOCARBAMOL 500 MG PO TABS
500.0000 mg | ORAL_TABLET | Freq: Four times a day (QID) | ORAL | Status: DC | PRN
  Administered 2016-10-17 – 2016-10-18 (×3): 500 mg via ORAL
  Filled 2016-10-17 (×2): qty 1

## 2016-10-17 MED ORDER — ONDANSETRON HCL 4 MG/2ML IJ SOLN
INTRAMUSCULAR | Status: AC
  Filled 2016-10-17: qty 2

## 2016-10-17 MED ORDER — OXYCODONE HCL 5 MG PO TABS
ORAL_TABLET | ORAL | Status: AC
  Administered 2016-10-17: 10 mg via ORAL
  Filled 2016-10-17: qty 2

## 2016-10-17 MED ORDER — MYCOPHENOLATE SODIUM 180 MG PO TBEC
180.0000 mg | DELAYED_RELEASE_TABLET | Freq: Two times a day (BID) | ORAL | Status: DC
  Filled 2016-10-17 (×3): qty 1

## 2016-10-17 MED ORDER — PROPOFOL 1000 MG/100ML IV EMUL
INTRAVENOUS | Status: AC
Start: 2016-10-17 — End: 2016-10-17
  Filled 2016-10-17: qty 100

## 2016-10-17 MED ORDER — ONDANSETRON HCL 4 MG/2ML IJ SOLN
INTRAMUSCULAR | Status: DC | PRN
  Administered 2016-10-17: 4 mg via INTRAVENOUS

## 2016-10-17 MED ORDER — HYDROMORPHONE HCL 1 MG/ML IJ SOLN: 0.2500 mg | INTRAMUSCULAR | Status: DC | PRN

## 2016-10-17 MED ORDER — LACTATED RINGERS IV SOLN
INTRAVENOUS | Status: DC
  Administered 2016-10-17 (×2): via INTRAVENOUS

## 2016-10-17 MED ORDER — PROMETHAZINE HCL 25 MG/ML IJ SOLN: 6.2500 mg | INTRAMUSCULAR | Status: DC | PRN

## 2016-10-17 MED ORDER — ONDANSETRON HCL 4 MG/2ML IJ SOLN: 4.0000 mg | Freq: Four times a day (QID) | INTRAMUSCULAR | Status: DC | PRN

## 2016-10-17 MED ORDER — OXYCODONE HCL 5 MG PO TABS
5.0000 mg | ORAL_TABLET | ORAL | Status: DC | PRN
  Administered 2016-10-17 (×3): 10 mg via ORAL
  Filled 2016-10-17 (×2): qty 2

## 2016-10-17 MED ORDER — VANCOMYCIN HCL 10 G IV SOLR
1250.0000 mg | Freq: Once | INTRAVENOUS | Status: AC
  Administered 2016-10-17: 1250 mg via INTRAVENOUS
  Filled 2016-10-17: qty 1250

## 2016-10-17 MED ORDER — PROPOFOL 1000 MG/100ML IV EMUL
INTRAVENOUS | Status: AC
  Filled 2016-10-17: qty 100

## 2016-10-17 MED ORDER — METHOCARBAMOL 500 MG PO TABS
ORAL_TABLET | ORAL | Status: AC
  Administered 2016-10-17: 500 mg via ORAL
  Filled 2016-10-17: qty 1

## 2016-10-17 MED ORDER — LACTATED RINGERS IV SOLN: INTRAVENOUS | Status: DC

## 2016-10-17 MED ORDER — DOCUSATE SODIUM 100 MG PO CAPS
100.0000 mg | ORAL_CAPSULE | Freq: Two times a day (BID) | ORAL | Status: DC
  Administered 2016-10-17: 100 mg via ORAL
  Filled 2016-10-17: qty 1

## 2016-10-17 MED ORDER — SODIUM CHLORIDE 0.9% FLUSH: 3.0000 mL | Freq: Two times a day (BID) | INTRAVENOUS | Status: DC

## 2016-10-17 MED ORDER — PHENYLEPHRINE HCL 10 MG/ML IJ SOLN
INTRAMUSCULAR | Status: DC | PRN
  Administered 2016-10-17: 50 ug/min via INTRAVENOUS

## 2016-10-17 MED ORDER — SODIUM CHLORIDE 0.9 % IV SOLN: 250.0000 mL | INTRAVENOUS | Status: DC

## 2016-10-17 MED ORDER — METOPROLOL TARTRATE 5 MG/5ML IV SOLN
INTRAVENOUS | Status: AC
  Filled 2016-10-17: qty 5

## 2016-10-17 MED ORDER — MEPERIDINE HCL 25 MG/ML IJ SOLN: 6.2500 mg | INTRAMUSCULAR | Status: DC | PRN

## 2016-10-17 MED ORDER — GLYCOPYRROLATE 0.2 MG/ML IJ SOLN
INTRAMUSCULAR | Status: DC | PRN
  Administered 2016-10-17 (×2): 0.2 mg via INTRAVENOUS

## 2016-10-17 MED ORDER — FENTANYL CITRATE (PF) 250 MCG/5ML IJ SOLN
INTRAMUSCULAR | Status: DC | PRN
  Administered 2016-10-17: 150 ug via INTRAVENOUS
  Administered 2016-10-17 (×2): 50 ug via INTRAVENOUS

## 2016-10-17 MED ORDER — BUPIVACAINE HCL (PF) 0.25 % IJ SOLN
INTRAMUSCULAR | Status: AC
  Filled 2016-10-17: qty 30

## 2016-10-17 MED ORDER — METOPROLOL TARTARATE 1 MG/ML SYRINGE (5ML)
Status: DC | PRN
Start: 2016-10-17 — End: 2016-10-17
  Administered 2016-10-17: 1 mg via INTRAVENOUS

## 2016-10-17 MED ORDER — 0.9 % SODIUM CHLORIDE (POUR BTL) OPTIME
TOPICAL | Status: DC | PRN
  Administered 2016-10-17 (×2): 1000 mL

## 2016-10-17 MED ORDER — EPHEDRINE SULFATE 50 MG/ML IJ SOLN
INTRAMUSCULAR | Status: DC | PRN
  Administered 2016-10-17: 10 mg via INTRAVENOUS

## 2016-10-17 MED ORDER — MORPHINE SULFATE (PF) 2 MG/ML IV SOLN
2.0000 mg | INTRAVENOUS | Status: DC | PRN
  Administered 2016-10-18: 2 mg via INTRAVENOUS
  Filled 2016-10-17: qty 1

## 2016-10-17 MED ORDER — MIDAZOLAM HCL 2 MG/2ML IJ SOLN
INTRAMUSCULAR | Status: AC
  Filled 2016-10-17: qty 2

## 2016-10-17 MED ORDER — PROPOFOL 10 MG/ML IV BOLUS
INTRAVENOUS | Status: AC
  Filled 2016-10-17: qty 20

## 2016-10-17 MED ORDER — DEXAMETHASONE SODIUM PHOSPHATE 10 MG/ML IJ SOLN
INTRAMUSCULAR | Status: AC
  Filled 2016-10-17: qty 1

## 2016-10-17 MED ORDER — POLYETHYLENE GLYCOL 3350 17 G PO PACK: 17.0000 g | PACK | Freq: Every day | ORAL | Status: DC | PRN

## 2016-10-17 MED ORDER — ARTIFICIAL TEARS OP OINT
TOPICAL_OINTMENT | OPHTHALMIC | Status: DC | PRN
  Administered 2016-10-17: 1 via OPHTHALMIC

## 2016-10-17 MED ORDER — GABAPENTIN 600 MG PO TABS
600.0000 mg | ORAL_TABLET | Freq: Three times a day (TID) | ORAL | Status: DC
  Administered 2016-10-17 (×2): 600 mg via ORAL
  Filled 2016-10-17 (×2): qty 1

## 2016-10-17 MED ORDER — VANCOMYCIN HCL IN DEXTROSE 1-5 GM/200ML-% IV SOLN
1000.0000 mg | INTRAVENOUS | Status: DC
Start: 2016-10-18 — End: 2016-10-17

## 2016-10-17 MED ORDER — THROMBIN 20000 UNITS EX KIT: PACK | CUTANEOUS | Status: DC | PRN

## 2016-10-17 MED ORDER — FENTANYL CITRATE (PF) 250 MCG/5ML IJ SOLN
INTRAMUSCULAR | Status: AC
  Filled 2016-10-17: qty 5

## 2016-10-17 MED ORDER — VANCOMYCIN HCL 10 G IV SOLR
1500.0000 mg | INTRAVENOUS | Status: DC
  Filled 2016-10-17: qty 1500

## 2016-10-17 MED ORDER — PROPOFOL 1000 MG/100ML IV EMUL
INTRAVENOUS | Status: AC
  Filled 2016-10-17: qty 300

## 2016-10-17 MED ORDER — ARTIFICIAL TEARS OP OINT
TOPICAL_OINTMENT | OPHTHALMIC | Status: AC
  Filled 2016-10-17: qty 3.5

## 2016-10-17 MED ORDER — MENTHOL 3 MG MT LOZG: 1.0000 | LOZENGE | OROMUCOSAL | Status: DC | PRN

## 2016-10-17 MED ORDER — ACETAMINOPHEN 10 MG/ML IV SOLN
INTRAVENOUS | Status: AC
  Filled 2016-10-17: qty 100

## 2016-10-17 MED ORDER — METHOCARBAMOL 1000 MG/10ML IJ SOLN
500.0000 mg | Freq: Four times a day (QID) | INTRAVENOUS | Status: DC | PRN
  Filled 2016-10-17: qty 5

## 2016-10-17 MED ORDER — PROPOFOL 500 MG/50ML IV EMUL
INTRAVENOUS | Status: DC | PRN
  Administered 2016-10-17: 50 ug/kg/min via INTRAVENOUS
  Administered 2016-10-17: 12:00:00 via INTRAVENOUS

## 2016-10-17 MED ORDER — THROMBIN 20000 UNITS EX SOLR
CUTANEOUS | Status: AC
  Filled 2016-10-17: qty 20000

## 2016-10-17 MED ORDER — ONDANSETRON HCL 4 MG PO TABS: 4.0000 mg | ORAL_TABLET | Freq: Four times a day (QID) | ORAL | Status: DC | PRN

## 2016-10-17 MED ORDER — MIDAZOLAM HCL 2 MG/2ML IJ SOLN
INTRAMUSCULAR | Status: DC | PRN
  Administered 2016-10-17 (×2): 1 mg via INTRAVENOUS

## 2016-10-17 MED ORDER — SURGIFOAM 100 EX MISC
CUTANEOUS | Status: DC | PRN
  Administered 2016-10-17: 20 mL via TOPICAL

## 2016-10-17 MED ORDER — TACROLIMUS 1 MG PO CAPS
1.0000 mg | ORAL_CAPSULE | Freq: Every day | ORAL | Status: DC
  Filled 2016-10-17 (×2): qty 1

## 2016-10-17 MED ORDER — LACTATED RINGERS IV SOLN
INTRAVENOUS | Status: DC
  Administered 2016-10-18: 05:00:00 via INTRAVENOUS

## 2016-10-17 MED ORDER — PROPOFOL 10 MG/ML IV BOLUS
INTRAVENOUS | Status: DC | PRN
  Administered 2016-10-17: 100 mg via INTRAVENOUS
  Administered 2016-10-17: 40 mg via INTRAVENOUS

## 2016-10-17 MED ORDER — ROCURONIUM BROMIDE 50 MG/5ML IV SOSY
PREFILLED_SYRINGE | INTRAVENOUS | Status: AC
  Filled 2016-10-17: qty 5

## 2016-10-17 MED ORDER — PHENOL 1.4 % MT LIQD: 1.0000 | OROMUCOSAL | Status: DC | PRN

## 2016-10-17 MED ORDER — BUPIVACAINE HCL 0.25 % IJ SOLN
INTRAMUSCULAR | Status: DC | PRN
  Administered 2016-10-17: 10 mL

## 2016-10-17 MED ORDER — LIDOCAINE 2% (20 MG/ML) 5 ML SYRINGE
INTRAMUSCULAR | Status: AC
  Filled 2016-10-17: qty 5

## 2016-10-17 MED ORDER — SUCCINYLCHOLINE CHLORIDE 200 MG/10ML IV SOSY
PREFILLED_SYRINGE | INTRAVENOUS | Status: AC
Start: 2016-10-17 — End: 2016-10-17
  Filled 2016-10-17: qty 10

## 2016-10-17 MED ORDER — LIDOCAINE HCL (CARDIAC) 20 MG/ML IV SOLN
INTRAVENOUS | Status: DC | PRN
  Administered 2016-10-17: 80 mg via INTRATRACHEAL

## 2016-10-17 MED ORDER — EPINEPHRINE PF 1 MG/ML IJ SOLN
INTRAMUSCULAR | Status: DC | PRN
  Administered 2016-10-17: .15 mL

## 2016-10-17 MED ORDER — TACROLIMUS 1 MG PO CAPS
1.5000 mg | ORAL_CAPSULE | Freq: Every day | ORAL | Status: DC
  Filled 2016-10-17: qty 3

## 2016-10-17 SURGICAL SUPPLY — 73 items
BLADE CLIPPER SURG (BLADE) ×3 IMPLANT
BLADE SURG 10 STRL SS (BLADE) ×3 IMPLANT
BONE MATRIX OSTEOCEL PRO MED (Bone Implant) ×3 IMPLANT
CAGE MODULUS XL 12X18X60 - 10 (Cage) ×3 IMPLANT
CLOSURE STERI-STRIP 1/2X4 (GAUZE/BANDAGES/DRESSINGS) ×1
CLOSURE WOUND 1/2 X4 (GAUZE/BANDAGES/DRESSINGS) ×1
CLSR STERI-STRIP ANTIMIC 1/2X4 (GAUZE/BANDAGES/DRESSINGS) ×2 IMPLANT
CORDS BIPOLAR (ELECTRODE) ×3 IMPLANT
COVER SURGICAL LIGHT HANDLE (MISCELLANEOUS) ×3 IMPLANT
DRAPE C-ARM 42X72 X-RAY (DRAPES) ×6 IMPLANT
DRAPE C-ARMOR (DRAPES) ×3 IMPLANT
DRAPE INCISE IOBAN 66X45 STRL (DRAPES) ×3 IMPLANT
DRAPE ORTHO SPLIT 77X108 STRL (DRAPES) ×2
DRAPE POUCH INSTRU U-SHP 10X18 (DRAPES) ×3 IMPLANT
DRAPE SURG ORHT 6 SPLT 77X108 (DRAPES) ×1 IMPLANT
DRAPE U-SHAPE 47X51 STRL (DRAPES) ×6 IMPLANT
DRSG AQUACEL AG ADV 3.5X 4 (GAUZE/BANDAGES/DRESSINGS) ×3 IMPLANT
DRSG AQUACEL AG ADV 3.5X 6 (GAUZE/BANDAGES/DRESSINGS) ×3 IMPLANT
DRSG AQUACEL AG ADV 3.5X10 (GAUZE/BANDAGES/DRESSINGS) IMPLANT
DURAPREP 26ML APPLICATOR (WOUND CARE) ×3 IMPLANT
ELECT BLADE 4.0 EZ CLEAN MEGAD (MISCELLANEOUS) ×3
ELECT PENCIL ROCKER SW 15FT (MISCELLANEOUS) ×3 IMPLANT
ELECT REM PT RETURN 9FT ADLT (ELECTROSURGICAL) ×3
ELECTRODE BLDE 4.0 EZ CLN MEGD (MISCELLANEOUS) ×1 IMPLANT
ELECTRODE REM PT RTRN 9FT ADLT (ELECTROSURGICAL) ×1 IMPLANT
FILTER STRAW FLUID ASPIR (MISCELLANEOUS) ×3 IMPLANT
GAUZE SPONGE 4X4 16PLY XRAY LF (GAUZE/BANDAGES/DRESSINGS) ×3 IMPLANT
GLOVE BIO SURGEON STRL SZ 6.5 (GLOVE) ×2 IMPLANT
GLOVE BIO SURGEONS STRL SZ 6.5 (GLOVE) ×1
GLOVE BIOGEL PI IND STRL 6.5 (GLOVE) ×1 IMPLANT
GLOVE BIOGEL PI IND STRL 8.5 (GLOVE) ×1 IMPLANT
GLOVE BIOGEL PI INDICATOR 6.5 (GLOVE) ×2
GLOVE BIOGEL PI INDICATOR 8.5 (GLOVE) ×2
GLOVE SS BIOGEL STRL SZ 8.5 (GLOVE) ×1 IMPLANT
GLOVE SUPERSENSE BIOGEL SZ 8.5 (GLOVE) ×2
GOWN STRL REUS W/ TWL XL LVL3 (GOWN DISPOSABLE) ×2 IMPLANT
GOWN STRL REUS W/TWL 2XL LVL3 (GOWN DISPOSABLE) ×3 IMPLANT
GOWN STRL REUS W/TWL XL LVL3 (GOWN DISPOSABLE) ×4
KIT BASIN OR (CUSTOM PROCEDURE TRAY) ×3 IMPLANT
KIT DILATOR XLIF 5 (KITS) ×1 IMPLANT
KIT ROOM TURNOVER OR (KITS) ×3 IMPLANT
KIT SURGICAL ACCESS MAXCESS 4 (KITS) ×3 IMPLANT
KIT XLIF (KITS) ×2
MODULE NVM5 NEXT GEN EMG (NEEDLE) ×3 IMPLANT
NEEDLE 22X1 1/2 (OR ONLY) (NEEDLE) ×3 IMPLANT
NEEDLE SPNL 18GX3.5 QUINCKE PK (NEEDLE) ×3 IMPLANT
NS IRRIG 1000ML POUR BTL (IV SOLUTION) ×3 IMPLANT
PACK LAMINECTOMY ORTHO (CUSTOM PROCEDURE TRAY) ×3 IMPLANT
PACK UNIVERSAL I (CUSTOM PROCEDURE TRAY) ×3 IMPLANT
PAD ARMBOARD 7.5X6 YLW CONV (MISCELLANEOUS) ×9 IMPLANT
SPONGE LAP 4X18 X RAY DECT (DISPOSABLE) ×3 IMPLANT
SPONGE SURGIFOAM ABS GEL 100 (HEMOSTASIS) ×3 IMPLANT
STAPLER VISISTAT 35W (STAPLE) ×3 IMPLANT
STRIP CLOSURE SKIN 1/2X4 (GAUZE/BANDAGES/DRESSINGS) ×2 IMPLANT
SURGIFLO W/THROMBIN 8M KIT (HEMOSTASIS) ×3 IMPLANT
SUT BONE WAX W31G (SUTURE) ×3 IMPLANT
SUT MON AB 3-0 SH 27 (SUTURE) ×2
SUT MON AB 3-0 SH27 (SUTURE) ×1 IMPLANT
SUT VIC AB 1 CT1 18XCR BRD 8 (SUTURE) ×1 IMPLANT
SUT VIC AB 1 CT1 27 (SUTURE) ×2
SUT VIC AB 1 CT1 27XBRD ANBCTR (SUTURE) ×1 IMPLANT
SUT VIC AB 1 CT1 8-18 (SUTURE) ×2
SUT VIC AB 1 CTX 36 (SUTURE)
SUT VIC AB 1 CTX36XBRD ANBCTR (SUTURE) IMPLANT
SUT VIC AB 2-0 CT1 18 (SUTURE) ×6 IMPLANT
SYR BULB IRRIGATION 50ML (SYRINGE) ×3 IMPLANT
SYR CONTROL 10ML LL (SYRINGE) ×3 IMPLANT
SYR TB 1ML LUER SLIP (SYRINGE) ×3 IMPLANT
TAPE CLOTH 4X10 WHT NS (GAUZE/BANDAGES/DRESSINGS) ×6 IMPLANT
TOWEL OR 17X24 6PK STRL BLUE (TOWEL DISPOSABLE) ×3 IMPLANT
TOWEL OR 17X26 10 PK STRL BLUE (TOWEL DISPOSABLE) ×3 IMPLANT
TRAY FOLEY CATH SILVER 16FR (SET/KITS/TRAYS/PACK) ×3 IMPLANT
WATER STERILE IRR 1000ML POUR (IV SOLUTION) ×3 IMPLANT

## 2016-10-17 NOTE — Anesthesia Preprocedure Evaluation (Signed)
Anesthesia Evaluation  Patient identified by MRN, date of birth, ID band Patient awake    Reviewed: Allergy & Precautions, NPO status , Patient's Chart, lab work & pertinent test results  Airway Mallampati: II  TM Distance: >3 FB Neck ROM: Full    Dental  (+) Teeth Intact, Dental Advisory Given   Pulmonary neg pulmonary ROS,    breath sounds clear to auscultation       Cardiovascular negative cardio ROS   Rhythm:Regular Rate:Normal     Neuro/Psych  Neuromuscular disease negative psych ROS   GI/Hepatic negative GI ROS, Neg liver ROS,   Endo/Other  negative endocrine ROS  Renal/GU Renal disease  negative genitourinary   Musculoskeletal  (+) Arthritis , Osteoarthritis,    Abdominal   Peds negative pediatric ROS (+)  Hematology negative hematology ROS (+)   Anesthesia Other Findings s/p Liver & Kidney transplant  Reproductive/Obstetrics negative OB ROS                             Lab Results  Component Value Date   WBC 4.2 10/15/2016   HGB 16.7 10/15/2016   HCT 49.2 10/15/2016   MCV 87.7 10/15/2016   PLT 98 (L) 10/15/2016   Lab Results  Component Value Date   CREATININE 1.29 (H) 10/15/2016   BUN 15 10/15/2016   NA 140 10/15/2016   K 4.4 10/15/2016   CL 107 10/15/2016   CO2 26 10/15/2016   No results found for: INR, PROTIME  EKG: normal sinus rhythm.   Anesthesia Physical Anesthesia Plan  ASA: III  Anesthesia Plan: General   Post-op Pain Management:    Induction: Intravenous  Airway Management Planned: Oral ETT  Additional Equipment:   Intra-op Plan:   Post-operative Plan: Extubation in OR  Informed Consent: I have reviewed the patients History and Physical, chart, labs and discussed the procedure including the risks, benefits and alternatives for the proposed anesthesia with the patient or authorized representative who has indicated his/her understanding and  acceptance.   Dental advisory given  Plan Discussed with: CRNA  Anesthesia Plan Comments: (Consent obtained for arterial line if necessary. )        Anesthesia Quick Evaluation

## 2016-10-17 NOTE — Transfer of Care (Signed)
Immediate Anesthesia Transfer of Care Note  Patient: [name] [name]  Procedure(s) Performed: Procedure(s) with comments: XLIF L3-4 (N/A) - Requests for 3 hrs  Patient Location: PACU  Anesthesia Type:General  Level of Consciousness: awake, alert  and patient cooperative  Airway & Oxygen Therapy: Patient Spontanous Breathing and Patient connected to nasal cannula oxygen  Post-op Assessment: Report given to RN, Post -op Vital signs reviewed and stable, Patient moving all extremities X 4 and Patient able to stick tongue midline  Post vital signs: Reviewed and stable  Last Vitals:  Vitals:   10/17/16 0855  BP: (!) 173/79  Pulse: 72  Resp: 18  Temp: 36.4 C    Last Pain:  Vitals:   10/17/16 0855  TempSrc: Oral         Complications: No apparent anesthesia complications

## 2016-10-17 NOTE — Brief Op Note (Signed)
10/17/2016  1:11 PM  PATIENT:  [name] [name]  71 y.o. 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  PROCEDURE:  Procedure(s) with comments: XLIF L3-4 (N/A) - Requests for 3 hrs  SURGEON:  Surgeon(s) and Role:    * [name], MD - Primary  PHYSICIAN ASSISTANT:   ASSISTANTS: Carmen [name]   ANESTHESIA:   general  EBL:  Total I/O In: 1600 [I.V.:1600] Out: 350 [Urine:300; Blood:50]  BLOOD ADMINISTERED:none  DRAINS: none   LOCAL MEDICATIONS USED:  MARCAINE     SPECIMEN:  No Specimen  DISPOSITION OF SPECIMEN:  N/A  COUNTS:  YES  TOURNIQUET:  * No tourniquets in log *  DICTATION: .Other Dictation: Dictation Number [id number]  PLAN OF CARE: Admit to inpatient   PATIENT DISPOSITION:  PACU - hemodynamically stable.

## 2016-10-17 NOTE — H&P (Signed)
History of Present Illness  The patient is a 71 year old male who comes in today for a preoperative History and Physical. The patient is scheduled for a day 1 XLIF L3-4, day2 decompression L3-4 with post fusion to be performed by Dr. Duane Lope D. Rolena Infante, MD at [facility] on 10-17-16 and 10-18-16 . Please see the hospital record for complete dictated history and physical. the pt reports a hx of liver and renal transplant. Pt is normally quite active and in good health.  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)  Displacement of lumbar intervertebral disc without myelopathy (M51.26)  Postlaminectomy syndrome of lumbar region (M96.1)  Intervertebral disc disorders with radiculopathy, lumbar region (M51.16)  Other orthopedic aftercare (Z47.89)  S/P Lumbar Microdiscectomy (O16.073)  Problems Reconciled   Allergies  Zyvox *ANTI-INFECTIVE AGENTS - MISC.*  liver and kidney failure Allergies Reconciled   Family History  No pertinent family history   Social History  Tobacco use  never smoker Alcohol use  never consumed alcohol Children  2 Current work status  retired Engineer, agricultural (Currently)  no Drug/Alcohol Rehab (Previously)  no Exercise  Exercises daily; does running / walking, gym / weights and team sport Illicit drug use  no Living situation  live with spouse Marital status  married Number of flights of stairs before winded  greater than 5 Pain Contract  no  Medication History Robaxin (500MG  Tablet, 1 (one) Oral 1 po tid prn spasm,  Neurontin  OxyCODONE HCl Multi Vitamin Mens (Oral) Active. (qd) Prograf (0.5MG  Capsule, Oral) Active. Prograf (1MG  Capsule, Oral) Active. (2.5mg ) Myfortic (180MG  Tablet DR, Oral) Active. Medications Reconciled  Past Surgical History  Carpal Tunnel Repair  bilateral Vasectomy    Other Problems  Gout  Transplantation; Liver  Transplantation; Renal   Vitals  10/16/2016 10:28 AM Weight: 300 lb Height: 72in Body Surface Area: 2.53 m Body Mass Index: 40.69 kg/m  Temp.: 97.58F(Oral)  BP: 138/82 (Sitting, Right Arm, Standard)  General General Appearance-Not in acute distress. Orientation-Oriented X3. Build & Nutrition-Well nourished and Well developed.  Integumentary General Characteristics Surgical Scars - no surgical scar evidence of previous lumbar surgery. Lumbar Spine-Skin examination of the lumbar spine is without deformity, skin lesions, lacerations or abrasions.  Chest and Lung Exam Auscultation Breath sounds - Normal and Clear.  Cardiovascular Auscultation Rhythm - Regular rate and rhythm.  Abdomen Palpation/Percussion Palpation and Percussion of the abdomen reveal - Soft, Non Tender and No Rebound tenderness.  Peripheral Vascular Lower Extremity Palpation - Posterior tibial pulse - Bilateral - 2+. Dorsalis pedis pulse - Bilateral - 2+.  Neurologic Sensation Lower Extremity - Bilateral - sensation is intact in the lower extremity. Reflexes Patellar Reflex - Bilateral - 2+. Achilles Reflex - Bilateral - 2+. Testing Seated Straight Leg Raise - Left - Seated straight leg raise positive. Right - Seated straight leg raise negative.  Musculoskeletal Spine/Ribs/Pelvis  Lumbosacral Spine: Inspection and Palpation - Tenderness - left lumbar paraspinals tender to palpation and right lumbar paraspinals tender to palpation. Strength and Tone: Strength - Hip Flexion - Left - 3+/5. Right - 5/5. Knee Extension - Bilateral - 5/5. Knee Flexion - Bilateral - 5/5. Ankle Dorsiflexion - Bilateral - 5/5. Ankle Plantarflexion - Bilateral - 5/5. Heel walk - Bilateral - unable to heel walk. Toe Walk - Bilateral - unable to walk on toes. ROM - Flexion - moderately decreased range of motion and  painful. Extension - moderately decreased  range of motion and painful. Left Lateral Bending - moderately decreased range of motion and painful. Right Lateral Bending - moderately decreased range of motion and painful. Right Rotation - moderately decreased range of motion and painful. Left Rotation - moderately decreased range of motion and painful. Pain - neither flexion or extension is more painful than the other. Lumbosacral Spine - Waddell's Signs - no Waddell's signs present. Lower Extremity Range of Motion - No true hip, knee or ankle pain with range of motion. Gait and Station - [employer] - cane and wheelchair.  Assessment & Plan   He had about three to four hours of relief after the L3 selective nerve root block. He continues to have horrific back, buttock and neuropathic left leg pain.  He has positive quad weakness, difficulty standing and ambulating and he also has some left hip flexor weakness. He has horrific back pain with forward flexion, extension, attempts to ambulate. No loss in bowel or bladder control. No shortness of breath or chest pain. Intact peripheral pulses bilaterally.  At this point in time, it is clear that he has a post laminectomy syndrome with a recurrent disc herniation foraminally at L3-4 posterolateral to the left. At this point, my recommendation because he is having severe back, buttock and recurrent neuropathic leg pain is to move forward with a fusion, my recommendation would be to do a lateral interbody fusion on day one and then on day two if he still having horrific leg pain, then do a L3-4 Gill resection and complete decompression of the L3 nerve root with pedicle screw supplementation. If the leg pain is improved then on postoperative day two I would just supplement the interbody fusion with posterior pedicle screw fixation. We reviewed all of the risks and benefits of surgery including injury to the lumbar plexus resulting in hip flexor weakness, which would make ambulating difficult. I have  answered all their questions. More than likely he will need a custom LSO brace given his body habitus which he will use for six weeks. I will set him up to go to [employer] to have this fitted and deliver day of surgery. In addition, he will need more than likely potentially postoperative skilled nursing/rehab before returning home. All of his and his wife's questions were addressed.  Goal Of Surgery: Discussed that goal of surgery is to reduce pain and improve function and quality of life. Patient is aware that despite all appropriate treatment that there pain and function could be the same, worse, or different.  Lateral Lumbar Fusion: Risksof surgery include, but are not limited to: Death, stroke, paralysis, nerve root damage/injury, bleeding, blood clots, loss of bowel/bladder control, sexual dysfunction, retrograde ejaculation, hardware failure, or mal-position, spinal fluid leak, adjacent segment disease, non-union, need for further surgery, ongoing or worse pain, and injury to bladder, bowel and abdominal contents, infection. Injury to the lumbar plexus resulting in temporary or permanent hip flexor weakness and difficulty walking. Need for supplemental posterior surgery including decompression and need for screw fixation.  Posterior decompression/Fusion:Risks of surgery include infection, bleeding, nerve damage, death, stroke, paralysis, failure to heal, need for further surgery, ongoing or worse pain, need for further surgery, CSF leak, loss of bowel or bladder, and recurrent disc herniation or stenosis which would necessitate need for further surgery. Non-union, hardware failure, adjacent segment disease and recurrent pain. Hardware breakage, mal-position requiring surgery to correct or remove.

## 2016-10-17 NOTE — Anesthesia Procedure Notes (Signed)
Procedure Name: Intubation Date/Time: 10/17/2016 10:41 AM Performed by: Suella Broad D Pre-anesthesia Checklist: Patient identified, Emergency Drugs available, Suction available and Patient being monitored Patient Re-evaluated:Patient Re-evaluated prior to inductionOxygen Delivery Method: Circle system utilized Preoxygenation: Pre-oxygenation with 100% oxygen Intubation Type: IV induction Ventilation: Mask ventilation without difficulty Laryngoscope Size: Mac and 3 Grade View: Grade I Tube type: Oral Number of attempts: 1 Airway Equipment and Method: Stylet Placement Confirmation: ETT inserted through vocal cords under direct vision,  positive ETCO2 and breath sounds checked- equal and bilateral Secured at: 23 cm Tube secured with: Tape Dental Injury: Teeth and Oropharynx as per pre-operative assessment

## 2016-10-17 NOTE — Progress Notes (Signed)
Vancomycin per Pharmacy for Surgical Prophylaxis  71 YO M s/p spinal surgery, on vancomycin for surgical prophylaxis, pharmacy to adjust dosage base one renal function. Scr 1.29, est. crcl ~ 75 ml/min. He received pre-op dose 1500mg  vancomycin at ~ 1015. No drain, back to OR tomorrow AM for more procedures.  Plan: Vancomycin 1250 mg x 1 at 2200. Pharmacy sign off.  Thanks.   Maryanna Shape, PharmD, BCPS  Clinical Pharmacist  Pager: [phone]

## 2016-10-18 ENCOUNTER — Encounter (HOSPITAL_COMMUNITY)
Admission: RE | Disposition: A | Discharge status: Home / Self Care | Payer: Self-pay | Source: Ambulatory Visit | Attending: Orthopedic Surgery

## 2016-10-18 ENCOUNTER — Inpatient Hospital Stay (HOSPITAL_COMMUNITY): Payer: Medicare Other | Admitting: Vascular Surgery

## 2016-10-18 ENCOUNTER — Inpatient Hospital Stay (HOSPITAL_COMMUNITY): Admission: RE | Admit: 2016-10-18 | Payer: Medicare Other | Source: Ambulatory Visit | Admitting: Orthopedic Surgery

## 2016-10-18 ENCOUNTER — Inpatient Hospital Stay (HOSPITAL_COMMUNITY): Payer: Medicare Other

## 2016-10-18 ENCOUNTER — Encounter (HOSPITAL_COMMUNITY): Payer: Self-pay | Admitting: Orthopedic Surgery

## 2016-10-18 LAB — GLUCOSE, CAPILLARY: GLUCOSE-CAPILLARY: 133 mg/dL — AB (ref 65–99)

## 2016-10-18 SURGERY — POSTERIOR LUMBAR FUSION 1 LEVEL
Anesthesia: General | Site: Spine Lumbar

## 2016-10-18 MED ORDER — PHENYLEPHRINE HCL 10 MG/ML IJ SOLN
INTRAMUSCULAR | Status: DC | PRN
  Administered 2016-10-18 (×3): 80 ug via INTRAVENOUS

## 2016-10-18 MED ORDER — HYDROMORPHONE HCL 1 MG/ML IJ SOLN
INTRAMUSCULAR | Status: AC
  Filled 2016-10-18: qty 0.5

## 2016-10-18 MED ORDER — PHENOL 1.4 % MT LIQD: 1.0000 | OROMUCOSAL | Status: DC | PRN

## 2016-10-18 MED ORDER — SUCCINYLCHOLINE CHLORIDE 20 MG/ML IJ SOLN
INTRAMUSCULAR | Status: DC | PRN
  Administered 2016-10-18: 120 mg via INTRAVENOUS

## 2016-10-18 MED ORDER — BUPIVACAINE-EPINEPHRINE 0.25% -1:200000 IJ SOLN
INTRAMUSCULAR | Status: DC | PRN
  Administered 2016-10-18: 30 mL

## 2016-10-18 MED ORDER — PHENYLEPHRINE HCL 10 MG/ML IJ SOLN
INTRAVENOUS | Status: DC | PRN
  Administered 2016-10-18: 50 ug/min via INTRAVENOUS

## 2016-10-18 MED ORDER — METHOCARBAMOL 500 MG PO TABS: 500.0000 mg | ORAL_TABLET | Freq: Four times a day (QID) | ORAL | Status: DC | PRN

## 2016-10-18 MED ORDER — HYDROMORPHONE HCL 1 MG/ML IJ SOLN
0.2500 mg | INTRAMUSCULAR | Status: DC | PRN
  Administered 2016-10-18 (×2): 0.5 mg via INTRAVENOUS

## 2016-10-18 MED ORDER — BUPIVACAINE LIPOSOME 1.3 % IJ SUSP
INTRAMUSCULAR | Status: DC | PRN
  Administered 2016-10-18: 20 mL

## 2016-10-18 MED ORDER — LIDOCAINE HCL (CARDIAC) 20 MG/ML IV SOLN
INTRAVENOUS | Status: DC | PRN
  Administered 2016-10-18: 80 mg via INTRAVENOUS

## 2016-10-18 MED ORDER — SODIUM CHLORIDE 0.9% FLUSH: 3.0000 mL | INTRAVENOUS | Status: DC | PRN

## 2016-10-18 MED ORDER — OXYCODONE HCL 5 MG PO TABS: 5.0000 mg | ORAL_TABLET | Freq: Once | ORAL | Status: DC | PRN

## 2016-10-18 MED ORDER — LACTATED RINGERS IV SOLN
INTRAVENOUS | Status: DC | PRN
  Administered 2016-10-18 (×2): via INTRAVENOUS

## 2016-10-18 MED ORDER — EPHEDRINE SULFATE 50 MG/ML IJ SOLN
INTRAMUSCULAR | Status: DC | PRN
  Administered 2016-10-18 (×2): 10 mg via INTRAVENOUS

## 2016-10-18 MED ORDER — OXYCODONE HCL 5 MG/5ML PO SOLN: 5.0000 mg | Freq: Once | ORAL | Status: DC | PRN

## 2016-10-18 MED ORDER — 0.9 % SODIUM CHLORIDE (POUR BTL) OPTIME
TOPICAL | Status: DC | PRN
  Administered 2016-10-18: 1000 mL

## 2016-10-18 MED ORDER — PROPOFOL 500 MG/50ML IV EMUL
INTRAVENOUS | Status: DC | PRN
  Administered 2016-10-18: 75 ug/kg/min via INTRAVENOUS
  Administered 2016-10-18: 14:00:00 via INTRAVENOUS

## 2016-10-18 MED ORDER — THROMBIN 20000 UNITS EX SOLR
CUTANEOUS | Status: DC | PRN
  Administered 2016-10-18: 20 mL via TOPICAL

## 2016-10-18 MED ORDER — SODIUM CHLORIDE 0.9 % IV SOLN: 250.0000 mL | INTRAVENOUS | Status: DC

## 2016-10-18 MED ORDER — ONDANSETRON HCL 4 MG/2ML IJ SOLN
INTRAMUSCULAR | Status: DC | PRN
  Administered 2016-10-18: 4 mg via INTRAVENOUS

## 2016-10-18 MED ORDER — BUPIVACAINE HCL (PF) 0.25 % IJ SOLN
INTRAMUSCULAR | Status: AC
  Filled 2016-10-18: qty 30

## 2016-10-18 MED ORDER — VANCOMYCIN HCL 1000 MG IV SOLR
INTRAVENOUS | Status: DC | PRN
  Administered 2016-10-18: 1500 mg via INTRAVENOUS

## 2016-10-18 MED ORDER — SODIUM CHLORIDE 0.9% FLUSH
3.0000 mL | Freq: Two times a day (BID) | INTRAVENOUS | Status: DC
  Administered 2016-10-18 – 2016-10-19 (×2): 3 mL via INTRAVENOUS

## 2016-10-18 MED ORDER — BUPIVACAINE LIPOSOME 1.3 % IJ SUSP
20.0000 mL | INTRAMUSCULAR | Status: DC
  Filled 2016-10-18: qty 20

## 2016-10-18 MED ORDER — MIDAZOLAM HCL 5 MG/5ML IJ SOLN
INTRAMUSCULAR | Status: DC | PRN
  Administered 2016-10-18: 2 mg via INTRAVENOUS

## 2016-10-18 MED ORDER — DEXAMETHASONE 4 MG PO TABS
4.0000 mg | ORAL_TABLET | Freq: Four times a day (QID) | ORAL | Status: DC
  Administered 2016-10-19 (×2): 4 mg via ORAL
  Filled 2016-10-18 (×3): qty 1

## 2016-10-18 MED ORDER — LACTATED RINGERS IV SOLN
Freq: Once | INTRAVENOUS | Status: AC
  Administered 2016-10-18: 10:00:00 via INTRAVENOUS

## 2016-10-18 MED ORDER — SODIUM CHLORIDE 0.9 % IV SOLN
1250.0000 mg | Freq: Once | INTRAVENOUS | Status: AC
  Administered 2016-10-19: 1250 mg via INTRAVENOUS
  Filled 2016-10-18: qty 1250

## 2016-10-18 MED ORDER — MENTHOL 3 MG MT LOZG: 1.0000 | LOZENGE | OROMUCOSAL | Status: DC | PRN

## 2016-10-18 MED ORDER — LACTATED RINGERS IV SOLN: INTRAVENOUS | Status: DC

## 2016-10-18 MED ORDER — DEXAMETHASONE SODIUM PHOSPHATE 4 MG/ML IJ SOLN
4.0000 mg | Freq: Four times a day (QID) | INTRAMUSCULAR | Status: DC
  Administered 2016-10-18 (×2): 4 mg via INTRAVENOUS
  Filled 2016-10-18 (×2): qty 1

## 2016-10-18 MED ORDER — DEXAMETHASONE SODIUM PHOSPHATE 10 MG/ML IJ SOLN
INTRAMUSCULAR | Status: DC | PRN
  Administered 2016-10-18: 10 mg via INTRAVENOUS

## 2016-10-18 MED ORDER — FENTANYL CITRATE (PF) 100 MCG/2ML IJ SOLN
INTRAMUSCULAR | Status: DC | PRN
  Administered 2016-10-18: 150 ug via INTRAVENOUS
  Administered 2016-10-18 (×2): 50 ug via INTRAVENOUS

## 2016-10-18 MED ORDER — ACETAMINOPHEN 10 MG/ML IV SOLN
INTRAVENOUS | Status: DC | PRN
  Administered 2016-10-18: 1000 mg via INTRAVENOUS

## 2016-10-18 MED ORDER — PROPOFOL 10 MG/ML IV BOLUS
INTRAVENOUS | Status: DC | PRN
  Administered 2016-10-18: 150 mg via INTRAVENOUS

## 2016-10-18 MED ORDER — ONDANSETRON HCL 4 MG/2ML IJ SOLN: 4.0000 mg | Freq: Four times a day (QID) | INTRAMUSCULAR | Status: DC | PRN

## 2016-10-18 MED ORDER — METHOCARBAMOL 1000 MG/10ML IJ SOLN: 500.0000 mg | Freq: Four times a day (QID) | INTRAVENOUS | Status: DC | PRN

## 2016-10-18 MED ORDER — EPINEPHRINE PF 1 MG/ML IJ SOLN
INTRAMUSCULAR | Status: AC
  Filled 2016-10-18: qty 1

## 2016-10-18 MED ORDER — OXYCODONE HCL 5 MG PO TABS
10.0000 mg | ORAL_TABLET | ORAL | Status: DC | PRN
  Administered 2016-10-18: 10 mg via ORAL
  Administered 2016-10-19: 5 mg via ORAL
  Filled 2016-10-18 (×2): qty 2

## 2016-10-18 MED ORDER — ONDANSETRON HCL 4 MG PO TABS: 4.0000 mg | ORAL_TABLET | Freq: Four times a day (QID) | ORAL | Status: DC | PRN

## 2016-10-18 MED ORDER — THROMBIN 20000 UNITS EX SOLR
CUTANEOUS | Status: AC
  Filled 2016-10-18: qty 20000

## 2016-10-18 MED ORDER — FENTANYL CITRATE (PF) 100 MCG/2ML IJ SOLN: 25.0000 ug | INTRAMUSCULAR | Status: DC | PRN

## 2016-10-18 SURGICAL SUPPLY — 71 items
BLADE CLIPPER SURG (BLADE) ×3 IMPLANT
BUR EGG ELITE 4.0 (BURR) IMPLANT
BUR EGG ELITE 4.0MM (BURR)
CANISTER SUCT 3000ML PPV (MISCELLANEOUS) ×3 IMPLANT
CLIP NEUROVISION LG (CLIP) ×3 IMPLANT
CLOSURE STERI-STRIP 1/2X4 (GAUZE/BANDAGES/DRESSINGS) ×1
CLSR STERI-STRIP ANTIMIC 1/2X4 (GAUZE/BANDAGES/DRESSINGS) ×2 IMPLANT
COVER MAYO STAND STRL (DRAPES) ×6 IMPLANT
COVER SURGICAL LIGHT HANDLE (MISCELLANEOUS) ×3 IMPLANT
DRAPE C-ARM 42X72 X-RAY (DRAPES) ×3 IMPLANT
DRAPE C-ARMOR (DRAPES) ×6 IMPLANT
DRAPE INCISE IOBAN 66X45 STRL (DRAPES) ×3 IMPLANT
DRAPE POUCH INSTRU U-SHP 10X18 (DRAPES) IMPLANT
DRAPE SURG 17X23 STRL (DRAPES) ×3 IMPLANT
DRAPE U-SHAPE 47X51 STRL (DRAPES) ×3 IMPLANT
DRSG AQUACEL AG ADV 3.5X 6 (GAUZE/BANDAGES/DRESSINGS) ×3 IMPLANT
DRSG AQUACEL AG ADV 3.5X10 (GAUZE/BANDAGES/DRESSINGS) ×3 IMPLANT
DURAPREP 26ML APPLICATOR (WOUND CARE) IMPLANT
ELECT BLADE 4.0 EZ CLEAN MEGAD (MISCELLANEOUS) ×3
ELECT BLADE 6.5 EXT (BLADE) IMPLANT
ELECT PENCIL ROCKER SW 15FT (MISCELLANEOUS) ×3 IMPLANT
ELECT REM PT RETURN 9FT ADLT (ELECTROSURGICAL) ×3
ELECTRODE BLDE 4.0 EZ CLN MEGD (MISCELLANEOUS) ×1 IMPLANT
ELECTRODE REM PT RTRN 9FT ADLT (ELECTROSURGICAL) ×1 IMPLANT
GLOVE BIOGEL PI IND STRL 8.5 (GLOVE) ×1 IMPLANT
GLOVE BIOGEL PI INDICATOR 8.5 (GLOVE) ×2
GLOVE SS BIOGEL STRL SZ 8.5 (GLOVE) ×1 IMPLANT
GLOVE SUPERSENSE BIOGEL SZ 8.5 (GLOVE) ×2
GOWN STRL REUS W/ TWL LRG LVL3 (GOWN DISPOSABLE) ×1 IMPLANT
GOWN STRL REUS W/TWL 2XL LVL3 (GOWN DISPOSABLE) ×6 IMPLANT
GOWN STRL REUS W/TWL LRG LVL3 (GOWN DISPOSABLE) ×2
GUIDEWIRE NITINOL BEVEL TIP (WIRE) ×12 IMPLANT
KIT BASIN OR (CUSTOM PROCEDURE TRAY) ×3 IMPLANT
KIT POSITION SURG JACKSON T1 (MISCELLANEOUS) ×3 IMPLANT
KIT ROOM TURNOVER OR (KITS) ×3 IMPLANT
MODULE NVM5 NEXT GEN EMG (NEEDLE) ×3 IMPLANT
NEEDLE 22X1 1/2 (OR ONLY) (NEEDLE) ×3 IMPLANT
NEEDLE FILTER BLUNT 18X 1/2SAF (NEEDLE) ×2
NEEDLE FILTER BLUNT 18X1 1/2 (NEEDLE) ×1 IMPLANT
NEEDLE I-PASS III (NEEDLE) ×3 IMPLANT
NEEDLE SPNL 18GX3.5 QUINCKE PK (NEEDLE) ×6 IMPLANT
NS IRRIG 1000ML POUR BTL (IV SOLUTION) ×3 IMPLANT
PACK LAMINECTOMY ORTHO (CUSTOM PROCEDURE TRAY) ×3 IMPLANT
PACK UNIVERSAL I (CUSTOM PROCEDURE TRAY) ×3 IMPLANT
PAD ARMBOARD 7.5X6 YLW CONV (MISCELLANEOUS) ×9 IMPLANT
PATTIES SURGICAL .5 X.5 (GAUZE/BANDAGES/DRESSINGS) IMPLANT
PATTIES SURGICAL .5 X1 (DISPOSABLE) ×3 IMPLANT
POSITIONER HEAD PRONE TRACH (MISCELLANEOUS) ×3 IMPLANT
PROBE BALL TIP NVM5 SNG USE (BALLOONS) ×3 IMPLANT
ROD RELINE MAS LORD 5.5X45MM (Rod) ×6 IMPLANT
SCREW LOCK RELINE 5.5 TULIP (Screw) ×12 IMPLANT
SCREW RED RELINE 7.5X45MM POLY (Screw) ×3 IMPLANT
SCREW RELINE RED 6.5X45MM POLY (Screw) ×15 IMPLANT
SPONGE LAP 4X18 X RAY DECT (DISPOSABLE) ×6 IMPLANT
SPONGE SURGIFOAM ABS GEL 100 (HEMOSTASIS) ×3 IMPLANT
SURGIFLO W/THROMBIN 8M KIT (HEMOSTASIS) IMPLANT
SUT BONE WAX W31G (SUTURE) ×3 IMPLANT
SUT MNCRL AB 3-0 PS2 18 (SUTURE) ×6 IMPLANT
SUT VIC AB 0 CT1 27 (SUTURE) ×2
SUT VIC AB 0 CT1 27XBRD ANBCTR (SUTURE) ×1 IMPLANT
SUT VIC AB 1 CT1 18XCR BRD 8 (SUTURE) ×1 IMPLANT
SUT VIC AB 1 CT1 8-18 (SUTURE) ×2
SUT VIC AB 2-0 CT1 18 (SUTURE) ×6 IMPLANT
SYR BULB IRRIGATION 50ML (SYRINGE) ×3 IMPLANT
SYR CONTROL 10ML LL (SYRINGE) ×3 IMPLANT
SYR TB 1ML LUER SLIP (SYRINGE) ×3 IMPLANT
TOWEL OR 17X24 6PK STRL BLUE (TOWEL DISPOSABLE) ×3 IMPLANT
TOWEL OR 17X26 10 PK STRL BLUE (TOWEL DISPOSABLE) ×3 IMPLANT
TRAY FOLEY W/METER SILVER 16FR (SET/KITS/TRAYS/PACK) IMPLANT
WATER STERILE IRR 1000ML POUR (IV SOLUTION) IMPLANT
YANKAUER SUCT BULB TIP NO VENT (SUCTIONS) ×3 IMPLANT

## 2016-10-18 NOTE — Brief Op Note (Signed)
10/17/2016 - 10/18/2016  3:15 PM  PATIENT:  [name] [name]  71 y.o. 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  PROCEDURE:  Procedure(s) with comments: Posterior Fusion Lumbar 3- Lumbar 4 (N/A) - Requests 3 hrs  SURGEON:  Surgeon(s) and Role:    * [name], MD - Primary  PHYSICIAN ASSISTANT:   ASSISTANTS: none   ANESTHESIA:   general  EBL:  Total I/O In: 1300 [I.V.:1300] Out: 950 [Urine:800; Blood:150]  BLOOD ADMINISTERED:none  DRAINS: none   LOCAL MEDICATIONS USED:  MARCAINE     SPECIMEN:  No Specimen  DISPOSITION OF SPECIMEN:  N/A  COUNTS:  YES  TOURNIQUET:  * No tourniquets in log *  DICTATION: .Other Dictation: Dictation Number [id number]  PLAN OF CARE: Admit to inpatient   PATIENT DISPOSITION:  PACU - hemodynamically stable.

## 2016-10-18 NOTE — Op Note (Signed)
NAME:  ,                                 ACCOUNT NO.:  MEDICAL RECORD NO.:  [id number]  LOCATION:                                 FACILITY:  PHYSICIAN:  Algie Cales D. Rolena Infante, M.D.      DATE OF BIRTH:  DATE OF PROCEDURE:  10/17/2016 DATE OF DISCHARGE:                              OPERATIVE REPORT   PREOPERATIVE DIAGNOSIS:  Recurrent lumbar disk herniation L3-4 far lateral with recurrent radicular right leg pain and degenerative disk disease, L3-4.  POSTOPERATIVE DIAGNOSIS:  Recurrent lumbar disk herniation L3-4 far lateral with recurrent radicular right leg pain and degenerative disk disease, L3-4.  OPERATIVE PROCEDURES:  Lateral L3-4 fusion (X lift).  IMPLANT SYSTEM USED:  NuVasive titanium interbody spacer size 12 x 18 lordotic packed with Osteocel.  FIRST ASSISTANT:  Ronette Deter, [place].  HISTORY:  This is a very pleasant 71 year old gentleman, who had two previous diskectomies at L3-4 for disk herniation with radicular leg pain.  The patient had a recurrent episode of radicular leg pain and horrific back pain.  Conservative management failed to provide any long- lasting positive relief.  As a result, we elected to proceed with surgery.  Because of the previous surgery for degenerative disk disease and the significant back pain, we elected to proceed with the fusion. After discussing risks, benefits, and alternatives, the decision was made to move forward with a 2-day operation, day 1 would be the lateral interbody fusion and day 2 would be the posterior supplemental fixation plus or minus supplemental disk decompression.  The patient consented to the aforementioned procedure.  OPERATIVE NOTE:  The patient was brought to the operating room and placed supine on the operating table.  After successful induction of general anesthesia and endotracheal intubation, TEDs, SCDs, and Foley were inserted.  All appropriate neuromonitoring leads were then applied and the patient was turned  to the lateral decubitus position, left side up.  An axillary roll was placed.  All bony prominences were well padded and the patient was secured down to the table with tape securing the legs and chest.  X-ray confirmed excellent visualization of the L3-4 level in both the AP and lateral planes.  Once this was done, the lateral abdomen was prepped and draped, draped in a standard fashion. Time-out was taken confirming patient, procedure, and all other pertinent important data.  Once this was done, I marked out the borders of the L3-4 disk, infiltrated the incision site and made an incision on the lateral side on the flank.  Sharp dissection was carried out down to the fascia of the oblique muscles.  A second incision was made 1 fingerbreadth posteriorly to the first.  This small incision I dissected down bluntly to the posterior retroperitoneal fascia.  I then entered into the posterior rectus fascia and then palpated the psoas muscle.  I then swept circumferentially and then brought my finger to the undersurface of the oblique muscles.  I bluntly dissected through the oblique and then passed a trocar from the lateral incision down to the surface of the psoas.  I was in the  anterior third of the psoas stimulating and I had an adequate satisfactory stimulation with no evidence of plexus irritation or compression.  I then went through the psoas down to the lateral aspect of the disk space.  I sequentially dilated, stimulating with each dilatation, confirming I had adequate protection of the lumbar plexus.  The working distractor was placed.  I then gently moved the retracting system posteriorly, keeping a thick cuff of iliopsoas muscle between my retractor blade and the lumbar plexus.  Once I was properly positioned, I secured the Shim into the disk space and then stimulated circumferentially to ensure that I was not traumatizing the plexus.  Once this was done, I then extended  my retractors so I could clearly visualize the lateral L3-4 disk space.  Annulotomy was performed and then using pituitary rongeurs, curettes, and Kerrison rongeurs, I resected all of the disk material.  Care was made to keep the ALL completely intact.  I went across and released the contralateral annulus.  Using various curettes and scrapers, I removed all of the cartilage from the endplate and then used my trialing devices.  In both planes, the size 12 lordotic spacer provided excellent distraction and was well positioned.  I obtained the actual implant, packed it with Osteocel and malleted it to the appropriate depth.  This was a 60 mm length graft.  At no point in time was there any abnormal EMG nerve conduction findings.  I then irrigated the wound and made sure I had hemostasis using bipolar electrocautery and then slowly removed the retractor, checking the iliopsoas for bleeding at all throughout.  Once the retractor was removed, I then irrigated the wound, closed the fascia of the external oblique with interrupted #1 Vicryl sutures, and closed the deep fascia of the posterior wound with interrupted #1 Vicryl sutures. Both wounds were then irrigated again and closed in a layered fashion with 2-0 Vicryl interrupted and 3-0 Monocryl.  Steri-Strips and dry dressing were applied and the patient was ultimately extubated, transferred to PACU without incident.  At the end of the case, all needle and sponge counts were correct.  There were no adverse intraoperative events.  The plan is for the patient to return to the OR tomorrow.  If there is still ongoing significant radicular leg pain, I will move forward with a formal decompression.  If the leg pain has resolved, then I will just do a supplemental posterior fixation for added stability.     Joshua Zeringue D. Rolena Infante, M.D.     DDB/MEDQ  D:  10/17/2016  T:  10/17/2016  Job:  [id number]

## 2016-10-18 NOTE — Progress Notes (Signed)
    Subjective: Procedure(s) (LRB): Posterior fusion possible revision decompression L3-4 (N/A) Day of Surgery  Patient reports pain as 3 on 0-10 scale.  Reports none leg pain reports incisional back pain   N/A void - foley in place  Negative bowel movement Negative flatus Negative chest pain or shortness of breath  Objective: Vital signs in last 24 hours: Temp:  [97.3 F (36.3 C)-98.1 F (36.7 C)] 98 F (36.7 C) (04/19 0547) Pulse Rate:  [66-91] 82 (04/19 0547) Resp:  [13-20] 16 (04/19 0130) BP: (142-160)/(82-88) 154/82 (04/19 0547) SpO2:  [92 %-99 %] 95 % (04/19 0547)  Intake/Output from previous day: 04/18 0701 - 04/19 0700 In: 1600 [I.V.:1600] Out: 1875 [Urine:1825; Blood:50]  Labs: No results for input(s): WBC, RBC, HCT, PLT in the last 72 hours. No results for input(s): NA, K, CL, CO2, BUN, CREATININE, GLUCOSE, CALCIUM in the last 72 hours. No results for input(s): LABPT, INR in the last 72 hours.  Physical Exam: Neurologically intact ABD soft Intact pulses distally Incision: dressing C/D/I and no drainage Compartment soft no radicular leg pain  Assessment/Plan: Patient stable  xrays n/a Continue mobilization with physical therapy Continue care  Patient doing well s/p XLIF L3/4 Radicular leg pain resolved - ambulating without leg pain Plan on PSFI today - no need for foraminal decompression as radicular leg pain resolved.  [name], MD [place] [phone]

## 2016-10-18 NOTE — Anesthesia Procedure Notes (Signed)
Procedure Name: Intubation Date/Time: 10/18/2016 12:43 PM Performed by: Rebekah Chesterfield L Pre-anesthesia Checklist: Patient identified, Emergency Drugs available, Suction available and Patient being monitored Patient Re-evaluated:Patient Re-evaluated prior to inductionOxygen Delivery Method: Circle System Utilized Preoxygenation: Pre-oxygenation with 100% oxygen Intubation Type: IV induction Ventilation: Mask ventilation without difficulty Laryngoscope Size: Mac and 4 Grade View: Grade I Tube type: Oral Tube size: 8.0 mm Number of attempts: 1 Airway Equipment and Method: Stylet Placement Confirmation: ETT inserted through vocal cords under direct vision,  positive ETCO2 and breath sounds checked- equal and bilateral Secured at: 22 cm Tube secured with: Tape Dental Injury: Teeth and Oropharynx as per pre-operative assessment

## 2016-10-18 NOTE — Progress Notes (Signed)
Pharmacy Antibiotic Note  CARROLL LINGELBACH is a 71 y.o. male admitted on 10/17/2016 for back surgery. Back to OR today for additional procedure.  Pharmacy has been consulted for Vancomycin dosing for surgical prophylaxis. Received Vancomycin 1500 mg IV pre-op ~10am on 4/18 am and 1250 mg IV post-op ~10pm on 4/18 pm.    Vancomycin 1500 mg IV given pre-op today at 12:30pm.  No drain in place.  Plan:  Vancomycin 1250 mg IV x 1 at 1am on 4/20, ~12 hours after pre-op dose.  Pharmacy to sign off.   Temp (24hrs), Avg:97.7 F (36.5 C), Min:97 F (36.1 C), Max:98 F (36.7 C)   Recent Labs Lab 10/15/16 1036  WBC 4.2  CREATININE 1.29*    Estimated Creatinine Clearance: 78.7 mL/min (A) (by C-G formula based on SCr of 1.29 mg/dL (H)).    Allergies  Allergen Reactions  . Cephalosporins Other (See Comments)    Liver/Kidney failure   . Zyvox [Linezolid] Nausea Only and Other (See Comments)    Liver/Kidney failure.     Arty Baumgartner, [place] Pager: [phone] 10/18/2016 5:45 PM

## 2016-10-18 NOTE — Progress Notes (Signed)
Patient stood at edge of bed, ambulated to door and back to bed.  Foley was not removed as patient may return to surgery today.  Patient tolerated short ambulation well.  IV medication given prior.  No complaints at this time.  Continue to monitor.

## 2016-10-18 NOTE — Anesthesia Postprocedure Evaluation (Addendum)
Anesthesia Post Note  Patient: [name] [name]  Procedure(s) Performed: Procedure(s) (LRB): XLIF L3-4 (N/A)  Patient location during evaluation: PACU Anesthesia Type: General Level of consciousness: awake and alert Pain management: pain level controlled Vital Signs Assessment: post-procedure vital signs reviewed and stable Respiratory status: spontaneous breathing, nonlabored ventilation, respiratory function stable and patient connected to nasal cannula oxygen Cardiovascular status: blood pressure returned to baseline and stable Postop Assessment: no signs of nausea or vomiting Anesthetic complications: no       Last Vitals:  Vitals:   10/18/16 0130 10/18/16 0547  BP: (!) 158/88 (!) 154/82  Pulse: 66 82  Resp: 16   Temp: 36.5 C 36.7 C    Last Pain:  Vitals:   10/18/16 0617  TempSrc:   PainSc: 3                  Effie Berkshire

## 2016-10-18 NOTE — Transfer of Care (Signed)
Immediate Anesthesia Transfer of Care Note  Patient: [name] [name]  Procedure(s) Performed: Procedure(s) with comments: Posterior Fusion Lumbar 3- Lumbar 4 (N/A) - Requests 3 hrs  Patient Location: PACU  Anesthesia Type:General  Level of Consciousness: awake, alert , oriented and patient cooperative  Airway & Oxygen Therapy: Patient Spontanous Breathing and Patient connected to nasal cannula oxygen  Post-op Assessment: Report given to RN, Post -op Vital signs reviewed and stable and Patient moving all extremities  Post vital signs: Reviewed and stable  Last Vitals:  Vitals:   10/18/16 0547 10/18/16 1535  BP: (!) 154/82 (!) 144/68  Pulse: 82 90  Resp:  13  Temp: 36.7 C 36.1 C    Last Pain:  Vitals:   10/18/16 0617  TempSrc:   PainSc: 3       Patients Stated Pain Goal: 3 (03/40/35 2481)  Complications: No apparent anesthesia complications

## 2016-10-19 ENCOUNTER — Inpatient Hospital Stay (HOSPITAL_COMMUNITY): Payer: Medicare Other

## 2016-10-19 DIAGNOSIS — I82409 Acute embolism and thrombosis of unspecified deep veins of unspecified lower extremity: Secondary | ICD-10-CM

## 2016-10-19 MED ORDER — OXYCODONE HCL 10 MG PO TABS: 10.0000 mg | ORAL_TABLET | ORAL | 0 refills | Status: DC | PRN

## 2016-10-19 MED ORDER — ONDANSETRON 4 MG PO TBDP: 4.0000 mg | ORAL_TABLET | Freq: Three times a day (TID) | ORAL | 0 refills | Status: DC | PRN

## 2016-10-19 MED ORDER — METHOCARBAMOL 500 MG PO TABS
500.0000 mg | ORAL_TABLET | Freq: Three times a day (TID) | ORAL | Status: DC | PRN
  Administered 2016-10-19: 500 mg via ORAL
  Filled 2016-10-19 (×2): qty 1

## 2016-10-19 MED ORDER — GABAPENTIN 300 MG PO CAPS: 300.0000 mg | ORAL_CAPSULE | Freq: Three times a day (TID) | ORAL | 0 refills | Status: DC

## 2016-10-19 MED ORDER — METHOCARBAMOL 500 MG PO TABS: 500.0000 mg | ORAL_TABLET | Freq: Three times a day (TID) | ORAL | 0 refills | Status: AC

## 2016-10-19 MED ORDER — GABAPENTIN 300 MG PO CAPS
600.0000 mg | ORAL_CAPSULE | Freq: Three times a day (TID) | ORAL | Status: DC
  Administered 2016-10-19: 600 mg via ORAL
  Filled 2016-10-19 (×2): qty 2

## 2016-10-19 NOTE — Progress Notes (Signed)
Dressing was wrinkled, rolled. No drainage, odor, redness or tenderness noted. Replaced with two aquacell dressings.

## 2016-10-19 NOTE — Op Note (Signed)
NAME:  [name] [name]                       ACCOUNT NO.:  MEDICAL RECORD NO.:  [id number]  LOCATION:                                 FACILITY:  PHYSICIAN:  Particia Strahm D. Rolena [name], M.D.      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  Status post X lift L3-4.  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  Status post X lift L3-4.  OPERATIVE PROCEDURE:  Posterior pedicle screw fixation, L3-4.  COMPLICATIONS:  None.  CONDITION:  Stable.  IMPLANT USED:  NuVasive MIS pedicle screw system, 45 mm length, 6.5 diameter screws, with a 45-mm rod.  HISTORY:  This is a very pleasant 71 year old gentleman, who underwent X lift yesterday for degenerative disk disease and recurrent disk herniation.  Today, he noted that last night he was able to ambulate with no radicular leg pain.  The neuropathic pain that was debilitating his life preoperatively had resolved.  As a result of the fact he had no further neuropathic pain.  I elected only to proceed with the instrumented fusion as a supplement to the X lift that was X lift cage. I did not think a revision decompression was necessary given the fact that he was not having neuropathic pain.  I explained this to him preoperatively and he agreed.  OPERATIVE NOTE:  The patient was brought to the operating room and placed supine on the operating table.  After successful induction of general anesthesia and endotracheal intubation, TEDs and SCDs were applied.  Then, he was turned prone onto the spine frame.  All bony prominences were well padded and the back was prepped and draped in a standard fashion.  Time-out was taken confirming patient, procedure, and all other pertinent important data.  The C-arm was brought into the field.   I identified the lateral border of the L3 pedicle and made an incision in line with the lateral aspect of the pedicle.  I advanced the Jamshidi needle down to the lateral aspect of the pedicle and then advanced the Jamshidi needle using fluoroscopy as well as neuromonitoring.  Once I was nearing the medial border of the pedicle, I switched to a lateral view and confirmed that I was just beyond the posterior vertebral wall.  Confirming this, I advanced it into the vertebral body and then placed the guidepin.  I then removed the trocar and checked the AP and lateral views to confirm trajectory of the guide.  I repeated this exact same procedure at L4 and on the contralateral side.  Once all 4 pedicles were cannulated, I then used the self-drilling, self- tapping 45 mm length screws.  All screws were positioned and had excellent purchase.  I stemmed all 4 screws and then all stemmed greater than 40 milliamps.  I measured and placed the contoured rod and secured it into the pedicle screw construct with the locking cap.  All locking caps were torqued off according to manufacturer's standard.  Both  wounds were then copiously irrigated with normal saline and closed in a layered fashion with #1 Vicryl suture, 2-0 Vicryl sutures, and 3-0 Monocryl.  Final x-rays were satisfactory.  All 4 pedicle screws were in good position and the X lift cage had not moved.  The patient was ultimately extubated, transferred to PACU without incident.  At the end of the case, all needle and sponge counts were correct.     [name] [name], M.D.     DDB/MEDQ  D:  10/18/2016  T:  10/19/2016  Job:  [id number]

## 2016-10-19 NOTE — Progress Notes (Signed)
    Subjective: Procedure(s) (LRB): Posterior Fusion Lumbar 3- Lumbar 4 (N/A) 1 Day Post-Op  Patient reports pain as 2 on 0-10 scale.  Reports none leg pain reports incisional back pain   Positive void Positive bowel movement Positive flatus Negative chest pain or shortness of breath  Objective: Vital signs in last 24 hours: Temp:  [97 F (36.1 C)-98.7 F (37.1 C)] 98.7 F (37.1 C) (04/20 0457) Pulse Rate:  [83-100] 96 (04/20 0457) Resp:  [12-16] 16 (04/20 0457) BP: (144-174)/(65-92) 168/80 (04/20 0500) SpO2:  [91 %-97 %] 91 % (04/20 0457) Weight:  [139.7 kg (307 lb 14.4 oz)] 139.7 kg (307 lb 14.4 oz) (04/20 0500)  Intake/Output from previous day: 04/19 0701 - 04/20 0700 In: 2217.9 [P.O.:480; I.V.:1737.9] Out: 2825 [Urine:2675; Blood:150]  Labs: No results for input(s): WBC, RBC, HCT, PLT in the last 72 hours. No results for input(s): NA, K, CL, CO2, BUN, CREATININE, GLUCOSE, CALCIUM in the last 72 hours. No results for input(s): LABPT, INR in the last 72 hours.  Physical Exam: Neurologically intact ABD soft Intact pulses distally Incision: dressing C/D/I Compartment soft  Assessment/Plan: Patient stable  xrays n/a Continue mobilization with physical therapy Continue care  Advance diet Up with therapy  Radicular leg pain resolved.   Ambulating well Plan on d/c today if DVT scan negative and cleared by PT   [name], MD [place] [phone]

## 2016-10-19 NOTE — Progress Notes (Signed)
Occupational Therapy Evaluation and Discharge Patient Details Name: [name] [name] MRN: [id number] DOB: [birth year] Today's Date: 10/19/2016    History of Present Illness Pt is a 71 y/o male s/p Posterior Fusion Lumbar 3- Lumbar 4. Pt has a past medical history including A-fib ([place]); Arthritis; Chronic lower back pain; End stage renal disease (2014); Gout; Kidney transplant recipient; Liver transplant recipient; Neuromuscular disorder; and Obesity; Carpal tunnel release (Bilateral); Lumbar laminectomy/decompression microdiscectomy (Left, 12/01/2015).   Clinical Impression   PTA Pt modified independent in ADL/IADL and mobility. Education complete on back precautions, compensatory strategies for ADL/IADL. Pt verbalized understanding "I went through all this the last time I had back surgery." PT/OT spent significant time explaining the importance of wearing his brace when not in bed with Pt. Pt verbalized understanding. Education complete. OT to sign off at this point. Should other concern arise, please feel free to re-order OT. Thank you for this referral    Follow Up Recommendations  No OT follow up;Supervision/Assistance - 24 hour    Equipment Recommendations  None recommended by OT    Recommendations for Other Services       Precautions / Restrictions Precautions Precautions: Fall;Back Precaution Booklet Issued: Yes (comment) Precaution Comments: reviewed handout in full, Pt states he is familiar Required Braces or Orthoses: Spinal Brace Spinal Brace: Thoracolumbosacral orthotic;Applied in sitting position Restrictions Weight Bearing Restrictions: No      Mobility Bed Mobility Overal bed mobility: Needs Assistance Bed Mobility: Rolling;Sidelying to Sit;Sit to Sidelying Rolling: Supervision Sidelying to sit: Supervision     Sit to sidelying: Supervision General bed mobility comments: verbal and tactile cues give to Pt to maintain precautions (especially to prevent twisting) -  also reviewed in handout  Transfers Overall transfer level: Needs assistance Equipment used: Rolling walker (2 wheeled) Transfers: Sit to/from Stand Sit to Stand: Supervision         General transfer comment: vc for safe hand placement    Balance Overall balance assessment: Needs assistance Sitting-balance support: No upper extremity supported;Feet supported Sitting balance-Leahy Scale: Fair Sitting balance - Comments: sitting EOB to don brace   Standing balance support: Bilateral upper extremity supported;During functional activity Standing balance-Leahy Scale: Fair Standing balance comment: brief moments of static standing with no UE support                           ADL either performed or assessed with clinical judgement   ADL Overall ADL's : Needs assistance/impaired Eating/Feeding: Modified independent;Sitting   Grooming: Supervision/safety;Standing   Upper Body Bathing: Minimal assistance;With caregiver independent assisting   Lower Body Bathing: Minimal assistance;With caregiver independent assisting;With adaptive equipment   Upper Body Dressing : Moderate assistance;Sitting Upper Body Dressing Details (indicate cue type and reason): practiced and had Pt talk through/lead donning of brace Lower Body Dressing: Minimal assistance;With caregiver independent assisting;With adaptive equipment Lower Body Dressing Details (indicate cue type and reason): Pt has AE from previous surgery Toilet Transfer: Min guard;Ambulation;RW (wide BSC over toilet)       Tub/ Shower Transfer: Min guard;With caregiver independent assisting   Functional mobility during ADLs: Min guard;Cueing for safety;Rolling walker General ADL Comments: Pt familiar with compensatory strategies and AE from previous surgery, reviewed with Pt and Pt verbally acknowledged.     Vision Patient Visual Report: No change from baseline Vision Assessment?: No apparent visual deficits      Perception     Praxis      Pertinent Vitals/Pain Pain Assessment:  No/denies pain Pain Score: 0-No pain     Hand Dominance Right   Extremity/Trunk Assessment Upper Extremity Assessment Upper Extremity Assessment: Defer to OT evaluation   Lower Extremity Assessment Lower Extremity Assessment: Generalized weakness   Cervical / Trunk Assessment Cervical / Trunk Assessment: Other exceptions Cervical / Trunk Exceptions: s/p back surgery   Communication Communication Communication: No difficulties   Cognition Arousal/Alertness: Awake/alert Behavior During Therapy: WFL for tasks assessed/performed Overall Cognitive Status: Within Functional Limits for tasks assessed                                     General Comments  Pt educated extensively on importance of brace and reinforced when Pt needs to wear his brace.     Exercises     Shoulder Instructions      Home Living Family/patient expects to be discharged to:: Private residence Living Arrangements: Spouse/significant other Available Help at Discharge: Family;Available 24 hours/day Type of Home: House Home Access: Level entry     Home Layout: Two level;Able to live on main level with bedroom/bathroom     Bathroom Shower/Tub: Occupational psychologist: Handicapped height Bathroom Accessibility: Yes   Home Equipment: Environmental consultant - 2 wheels;Bedside commode;Adaptive equipment;Wheelchair - manual (walker is not bariatric) Adaptive Equipment: Reacher        Prior Functioning/Environment Level of Independence: Independent        Comments: denies a fall history        OT Problem List: Impaired balance (sitting and/or standing);Decreased safety awareness;Obesity;Decreased knowledge of precautions      OT Treatment/Interventions:      OT Goals(Current goals can be found in the care plan section) Acute Rehab OT Goals Patient Stated Goal: to get home OT Goal Formulation: With patient Time  For Goal Achievement: 10/26/16 Potential to Achieve Goals: Good  OT Frequency:     Barriers to D/C:            Co-evaluation PT/OT/SLP Co-Evaluation/Treatment: (P) Yes Reason for Co-Treatment: (P) To address functional/ADL transfers;For patient/therapist safety   OT goals addressed during session: (P) ADL's and self-care      End of Session Equipment Utilized During Treatment: Gait belt;Rolling walker;Back brace Nurse Communication: Mobility status;Precautions  Activity Tolerance: Patient tolerated treatment well Patient left: in bed;with call bell/phone within reach  OT Visit Diagnosis: Unsteadiness on feet (R26.81)                Time: 1033-1100 OT Time Calculation (min): 27 min Charges:  OT General Charges $OT Visit: 1 Procedure OT Evaluation $OT Eval Moderate Complexity: 1 Procedure G-Codes:    Hulda Humphrey OTR/L [place] 10/19/2016, 1:26 PM

## 2016-10-19 NOTE — Clinical Social Work Note (Signed)
CSW consulted for SNF placement. PT OT rec no follow up. CSW signing off as no further SW needs identified.   [name] [name], [place], [place] Work [phone]

## 2016-10-19 NOTE — Progress Notes (Signed)
    Subjective: 1 Day Post-Op Procedure(s) (LRB): Posterior Fusion Lumbar 3- Lumbar 4 (N/A) Patient reports pain as 3 on 0-10 scale.   Denies CP or SOB.  Voiding without difficulty. Positive flatus. Objective: Vital signs in last 24 hours: Temp:  [97 F (36.1 C)-98.7 F (37.1 C)] 98.7 F (37.1 C) (04/20 1400) Pulse Rate:  [83-100] 89 (04/20 1400) Resp:  [12-16] 16 (04/20 1400) BP: (144-174)/(65-92) 168/80 (04/20 0500) SpO2:  [91 %-97 %] 96 % (04/20 1400) Weight:  [139.7 kg (307 lb 14.4 oz)] 139.7 kg (307 lb 14.4 oz) (04/20 0500)  Intake/Output from previous day: 04/19 0701 - 04/20 0700 In: 2217.9 [P.O.:480; I.V.:1737.9] Out: 2825 [Urine:2675; Blood:150] Intake/Output this shift: No intake/output data recorded.  Labs: No results for input(s): HGB in the last 72 hours. No results for input(s): WBC, RBC, HCT, PLT in the last 72 hours. No results for input(s): NA, K, CL, CO2, BUN, CREATININE, GLUCOSE, CALCIUM in the last 72 hours. No results for input(s): LABPT, INR in the last 72 hours.  Physical Exam: Neurologically intact ABD soft Sensation intact distally Dorsiflexion/Plantar flexion intact Incision: scant drainage Compartment soft  Assessment/Plan: 1 Day Post-Op Procedure(s) (LRB): Posterior Fusion Lumbar 3- Lumbar 4 (N/A) Dopplers negative for DVT PT and OT have signed off DC patient today Medications provided on chart Pt will f/u in 2 weeks in clinic  Lincoln Kleiner, [name] for Dr. [name] [facility] [phone] 10/19/2016, 2:15 PM    Patient ID: [name] [name], male   DOB: Nov 29, 1945, 71 y.o.   MRN: [id number]

## 2016-10-19 NOTE — Progress Notes (Signed)
*  PRELIMINARY RESULTS* Vascular Ultrasound Lower extremity venous duplex has been completed.  Preliminary findings: No evidence of DVT or baker's cyst.   Landry Mellow, RDMS, RVT  10/19/2016, 11:59 AM

## 2016-10-19 NOTE — Care Management Note (Signed)
Case Management Note  Patient Details  Name: TRUMAINE WIMER MRN: [id number] Date of Birth: Aug 13, 1945  Subjective/Objective:                    Action/Plan: Pt discharging home with self care. Pt with orders for DME. Pt states he has the needed DME at home. Pt's wife is going to provide transportation home.   Expected Discharge Date:  10/19/16               Expected Discharge Plan:  Home/Self Care  In-House Referral:     Discharge planning Services     Post Acute Care Choice:  Durable Medical Equipment Choice offered to:     DME Arranged:  3-N-1, Walker rolling (pt has equipment at home) DME Agency:     HH Arranged:    [facility] Agency:     Status of Service:  Completed, signed off  If discussed at [employer] of Stay Meetings, dates discussed:    Additional Comments:  Pollie Friar, RN 10/19/2016, 2:54 PM

## 2016-10-19 NOTE — Evaluation (Signed)
Physical Therapy Evaluation and D/C Patient Details Name: [name] [name] MRN: [id number] DOB: Oct 05, 1945 Today's Date: 10/19/2016   History of Present Illness  Pt is a 71 y/o male s/p Posterior Fusion Lumbar 3- Lumbar 4. Pt has a past medical history including A-fib ([place]); Arthritis; Chronic lower back pain; End stage renal disease (2014); Gout; Kidney transplant recipient; Liver transplant recipient; Neuromuscular disorder; and Obesity; Carpal tunnel release (Bilateral); Lumbar laminectomy/decompression microdiscectomy (Left, 12/01/2015).  Clinical Impression  Pt admitted with above diagnosis. Pt currently without significant functional limitations with ambulation with RW with supervision.  Pt states he knows what to do and how to do it.  Listened to instruction but states "I will do what I think when I get home."  No further skilled PT as education completed in hospital.  D/C PT.      Follow Up Recommendations No PT follow up;Supervision - Intermittent    Equipment Recommendations  None recommended by PT    Recommendations for Other Services       Precautions / Restrictions Precautions Precautions: Fall;Back Precaution Booklet Issued: Yes (comment) Precaution Comments: reviewed handout in full, Pt states he is familiar Required Braces or Orthoses: Spinal Brace Spinal Brace: Thoracolumbosacral orthotic;Applied in sitting position Restrictions Weight Bearing Restrictions: No      Mobility  Bed Mobility Overal bed mobility: Needs Assistance Bed Mobility: Rolling;Sidelying to Sit;Sit to Sidelying Rolling: Supervision Sidelying to sit: Supervision     Sit to sidelying: Supervision General bed mobility comments: verbal and tactile cues give to Pt to maintain precautions (especially to prevent twisting) - also reviewed in handout  Transfers Overall transfer level: Needs assistance Equipment used: Rolling walker (2 wheeled) Transfers: Sit to/from Stand Sit to Stand: Supervision          General transfer comment: vc for safe hand placement  Ambulation/Gait Ambulation/Gait assistance: Supervision Ambulation Distance (Feet): 80 Feet Assistive device: Rolling walker (2 wheeled) Gait Pattern/deviations: Step-through pattern;Decreased stride length;Drifts right/left;Wide base of support   Gait velocity interpretation: Below normal speed for age/gender General Gait Details: Initially pt states he only needs to wear brace to go to church.  PT and OT instructed pt that per chart he should wear if staying up OOB and with ambulation.  Pt agreed reluctantly to place brace on.  Pt needed assist to don brace.   Pt needed cues to stay close to rW as he tends to bend forward and push RW a little too far in front of him.  Pt does not really listen to therapists suggestions.   Stairs            Wheelchair Mobility    Modified Rankin (Stroke Patients Only)       Balance Overall balance assessment: Needs assistance Sitting-balance support: No upper extremity supported;Feet supported Sitting balance-Leahy Scale: Fair Sitting balance - Comments: sitting EOB to don brace   Standing balance support: Bilateral upper extremity supported;During functional activity Standing balance-Leahy Scale: Fair Standing balance comment: brief moments of static standing with no UE support                             Pertinent Vitals/Pain Pain Assessment: No/denies pain Pain Score: 0-No pain    Home Living Family/patient expects to be discharged to:: Private residence Living Arrangements: Spouse/significant other Available Help at Discharge: Family;Available 24 hours/day Type of Home: House Home Access: Level entry     Home Layout: Two level;Able to live on main  level with bedroom/bathroom Home Equipment: Walker - 2 wheels;Bedside commode;Adaptive equipment;Wheelchair - manual (walker is not bariatric)      Prior Function Level of Independence: Independent          Comments: denies a fall history     Hand Dominance   Dominant Hand: Right    Extremity/Trunk Assessment   Upper Extremity Assessment Upper Extremity Assessment: Defer to OT evaluation    Lower Extremity Assessment Lower Extremity Assessment: Generalized weakness    Cervical / Trunk Assessment Cervical / Trunk Assessment: Other exceptions Cervical / Trunk Exceptions: s/p back surgery  Communication   Communication: No difficulties  Cognition Arousal/Alertness: Awake/alert Behavior During Therapy: WFL for tasks assessed/performed Overall Cognitive Status: Within Functional Limits for tasks assessed                                        General Comments General comments (skin integrity, edema, etc.): Pt educated extensively on importance of brace and reinforced when Pt needs to wear his brace.     Exercises     Assessment/Plan    PT Assessment Patent does not need any further PT services  PT Problem List Decreased activity tolerance;Decreased mobility;Decreased safety awareness;Decreased knowledge of precautions;Pain       PT Treatment Interventions Gait training;DME instruction;Functional mobility training;Patient/family education    PT Goals (Current goals can be found in the Care Plan section)  Acute Rehab PT Goals Patient Stated Goal: to get home PT Goal Formulation: All assessment and education complete, DC therapy    Frequency     Barriers to discharge        Co-evaluation   Reason for Co-Treatment: (P) To address functional/ADL transfers;For patient/therapist safety   OT goals addressed during session: (P) ADL's and self-care       End of Session Equipment Utilized During Treatment: Gait belt;Back brace Activity Tolerance: Patient limited by fatigue (self limiting) Patient left: in bed;with call bell/phone within reach Nurse Communication: Mobility status PT Visit Diagnosis: Unsteadiness on feet (R26.81);Muscle weakness  (generalized) (M62.81);Pain Pain - part of body:  (back)    Time: 0141-0301 PT Time Calculation (min) (ACUTE ONLY): 21 min   Charges:   PT Evaluation $PT Eval Moderate Complexity: 1 Procedure     PT G Codes:        [name] [name],PT Acute Rehabilitation [phone] (pager)   [name] [name] 10/19/2016, 1:26 PM

## 2016-10-19 NOTE — Progress Notes (Signed)
All discharge instructions reviewed in detail with pt (& wife) with understanding verbalized by both.  VSS.  Prescriptions provided to pt.  Back brace on.  Denies any questions or concerns prior to discharge.  Pt discharged to home in stable condition with wife present.

## 2016-10-22 NOTE — Anesthesia Postprocedure Evaluation (Addendum)
Anesthesia Post Note  Patient: [name] [name]  Procedure(s) Performed: Procedure(s) (LRB): Posterior Fusion Lumbar 3- Lumbar 4 (N/A)  Patient location during evaluation: PACU Anesthesia Type: General Level of consciousness: awake and alert Pain management: pain level controlled Vital Signs Assessment: post-procedure vital signs reviewed and stable Respiratory status: spontaneous breathing, nonlabored ventilation, respiratory function stable and patient connected to nasal cannula oxygen Cardiovascular status: blood pressure returned to baseline and stable Postop Assessment: no signs of nausea or vomiting Anesthetic complications: no       Last Vitals:  Vitals:   10/19/16 1400 10/19/16 1622  BP: (!) 174/82 (!) 146/84  Pulse: 89 82  Resp: 16 16  Temp: 37.1 C 36.9 C    Last Pain:  Vitals:   10/19/16 1622  TempSrc: Oral  PainSc:                  [name] [name]

## 2016-10-22 NOTE — Anesthesia Preprocedure Evaluation (Signed)
Anesthesia Evaluation  Patient identified by MRN, date of birth, ID band Patient awake    Reviewed: Allergy & Precautions, NPO status , Patient's Chart, lab work & pertinent test results  History of Anesthesia Complications Negative for: history of anesthetic complications  Airway Mallampati: II  TM Distance: >3 FB Neck ROM: Full    Dental  (+) Dental Advisory Given   Pulmonary neg pulmonary ROS,    breath sounds clear to auscultation       Cardiovascular negative cardio ROS   Rhythm:Regular     Neuro/Psych  Neuromuscular disease negative psych ROS   GI/Hepatic S/p liver transplant   Endo/Other    Renal/GU Renal InsufficiencyRenal diseases/p transplant     Musculoskeletal  (+) Arthritis ,   Abdominal   Peds  Hematology   Anesthesia Other Findings   Reproductive/Obstetrics                             Anesthesia Physical Anesthesia Plan  ASA: III  Anesthesia Plan: General   Post-op Pain Management:    Induction: Intravenous  Airway Management Planned: Oral ETT  Additional Equipment: None  Intra-op Plan:   Post-operative Plan: Extubation in OR  Informed Consent: I have reviewed the patients History and Physical, chart, labs and discussed the procedure including the risks, benefits and alternatives for the proposed anesthesia with the patient or authorized representative who has indicated his/her understanding and acceptance.   Dental advisory given  Plan Discussed with: CRNA and Surgeon  Anesthesia Plan Comments:         Anesthesia Quick Evaluation

## 2016-10-24 NOTE — Discharge Summary (Signed)
Physician Discharge Summary  Patient ID: [name] [name] MRN: [id number] DOB/AGE: 1946/02/11 71 y.o.  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 A-fib ([place])    post-operative afib 12/01/15  . Arthritis    spine, hips (10/17/2016)  . Benign neoplasm of colon   . Chronic lower back pain   . End stage renal disease ([place]) 2014   Had transplant; stopped dialysis 2014  . Gout   . History of blood transfusion    "w/my transplants  . History of kidney stones    was in the new kidney has passed  . Kidney transplant recipient   . Liver transplant recipient [facility])   . Neuromuscular disorder ([place])   . Obesity     Surgeries: Procedure(s): Posterior Fusion Lumbar 3- Lumbar 4 on 10/17/2016 - 10/18/2016   Consultants (if any):   Discharged Condition: Improved  Hospital Course: [name] [name] is an 71 y.o. male who was admitted 10/17/2016 with a diagnosis of DDD of L3-4 and recurrent disc herniation. and went to the operating room on 10/17/2016 - 10/18/2016 and underwent the above named procedures.  Post op day 1 pts pain is well controlled on oral medication.  Denies leg pain.  Pt reports positive voiding and BM.  Pt is ambulating in hallway. Doppler was negative for DVT. Pt cleared by OT and PT for discharge.   He was given perioperative antibiotics:  Anti-infectives    Start     Dose/Rate Route Frequency Ordered Stop   10/19/16 0100  vancomycin (VANCOCIN) 1,250 mg in sodium chloride 0.9 % 250 mL IVPB     1,250 mg 166.7 mL/hr over 90 Minutes Intravenous  Once 10/18/16 1743 10/19/16 0339   10/18/16 1045  vancomycin (VANCOCIN) IVPB 1000 mg/200 mL premix  Status:  Discontinued     1,000 mg 200 mL/hr over 60 Minutes Intravenous 60 min pre-op 10/17/16 0856 10/17/16 1512   10/17/16 2200  vancomycin (VANCOCIN) 1,250 mg in sodium chloride  0.9 % 250 mL IVPB     1,250 mg 166.7 mL/hr over 90 Minutes Intravenous  Once 10/17/16 1539 10/17/16 2246   10/17/16 0900  vancomycin (VANCOCIN) 1,500 mg in sodium chloride 0.9 % 500 mL IVPB     1,500 mg 250 mL/hr over 120 Minutes Intravenous 60 min pre-op 10/16/16 1416 10/17/16 1015    .  He was given sequential compression devices, early ambulation, and Taje for DVT prophylaxis.  He benefited maximally from the hospital stay and there were no complications.    Recent vital signs:  Vitals:   10/19/16 1400 10/19/16 1622  BP: (!) 174/82 (!) 146/84  Pulse: 89 82  Resp: 16 16  Temp: 98.7 F (37.1 C) 98.5 F (36.9 C)    Recent laboratory studies:  Lab Results  Component Value Date   HGB 16.7 10/15/2016   HGB 15.9 11/22/2015   Lab Results  Component Value Date   WBC 4.2 10/15/2016   PLT 98 (L) 10/15/2016   No results found for: INR Lab Results  Component Value Date   NA 140 10/15/2016   K 4.4 10/15/2016   CL 107 10/15/2016   CO2 26 10/15/2016   BUN 15 10/15/2016   CREATININE 1.29 (H) 10/15/2016   GLUCOSE 113 (H) 10/15/2016    Discharge Medications:   Allergies as of 10/19/2016  Reactions   Cephalosporins Other (See Comments)   Liver/Kidney failure   Zyvox [linezolid] Nausea Only, Other (See Comments)   Liver/Kidney failure.       Medication List    STOP taking these medications   gabapentin 600 MG tablet Commonly known as:  NEURONTIN Replaced by:  gabapentin 300 MG capsule     TAKE these medications   cholecalciferol 1000 units tablet Commonly known as:  VITAMIN D Take 2,000 Units by mouth daily. Takes 2 (25 mcg) tablets in the morning   colchicine 0.6 MG tablet Take 0.6 mg by mouth daily as needed. For gout flare ups.   gabapentin 300 MG capsule Commonly known as:  NEURONTIN Take 1 capsule (300 mg total) by mouth 3 (three) times daily. Replaces:  gabapentin 600 MG tablet   methocarbamol 500 MG tablet Commonly known as:  ROBAXIN Take 1 tablet  (500 mg total) by mouth 3 (three) times daily as needed for muscle spasms. What changed:  when to take this   methocarbamol 500 MG tablet Commonly known as:  ROBAXIN Take 1 tablet (500 mg total) by mouth 3 (three) times daily. What changed:  You were already taking a medication with the same name, and this prescription was added. Make sure you understand how and when to take each.   multivitamin with minerals Tabs tablet Take 1 tablet by mouth daily.   mycophenolate 180 MG EC tablet Commonly known as:  MYFORTIC Take 180 mg by mouth 2 (two) times daily.   ondansetron 4 MG disintegrating tablet Commonly known as:  ZOFRAN ODT Take 1 tablet (4 mg total) by mouth every 8 (eight) hours as needed for nausea or vomiting.   Oxycodone HCl 10 MG Tabs Take 1 tablet (10 mg total) by mouth every 4 (four) hours as needed for severe pain. What changed:  medication strength  how much to take  when to take this  reasons to take this  additional instructions   PROGRAF 0.5 MG capsule Generic drug:  tacrolimus Take 0.5 mg by mouth daily.   tacrolimus 1 MG capsule Commonly known as:  PROGRAF Take 1 mg by mouth 2 (two) times daily. Take one 1mg  with one 0.5mg  cap in the morning (1.5mg  total) and one 1mg  cap in the evening (1mg  total).       Diagnostic Studies: Dg Lumbar Spine 2-3 Views  Result Date: 10/18/2016 CLINICAL DATA:  Posterior fusion of L3-4. EXAM: DG C-ARM GT 120 MIN; LUMBAR SPINE - 2-3 VIEW CONTRAST:  None. FLUOROSCOPY TIME:  Fluoroscopy Time:  3 minutes 50 seconds. Number of Acquired Spot Images: 3 COMPARISON:  October 17, 2016.  December 01, 2015. FINDINGS: Status post surgical posterior fusion of L3-4 with bilateral intrapedicular screw placement and interbody fusion. Good alignment of vertebral bodies is noted. IMPRESSION: Status post surgical posterior fusion of L3-4 bilateral intrapedicular screw placement. Electronically Signed   By: Marijo Conception, M.D.   On: 10/18/2016 15:38    Dg Lumbar Spine 2-3 Views  Result Date: 10/17/2016 CLINICAL DATA:  Status post XL IF at L3-4. Reported fluoro time is 1 minutes and 59 seconds. EXAM: LUMBAR SPINE - 2-3 VIEW COMPARISON:  Preoperative studies of today's date FINDINGS: And inter discal cage device is been placed at L3-4. Radiographic positioning is good. IMPRESSION: No immediate postprocedure complication following XLIF at L3-4. Electronically Signed   By: David  Martinique M.D.   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 Prior left L3-4 far lateral discectomy with recurrent left foraminal to extraforaminal disc herniation on MRI affecting the L3 nerve. Left lower extremity pain. EXAM: EPIDURAL/NERVE ROOT FLUOROSCOPY TIME:  Radiation Exposure Index (as provided by the fluoroscopic device): 112.34 microgray*m^2 Fluoroscopy Time (in minutes and seconds):  40 seconds PROCEDURE: The procedure, risks, benefits, and alternatives were explained to the patient. Questions regarding the procedure were encouraged and answered. The patient understands and consents to the procedure. LEFT L3 NERVE ROOT BLOCK AND TRANSFORAMINAL EPIDURAL: A posterior oblique approach was taken to the intervertebral foramen on the left at L3-4 using a curved 5 inch 22 gauge spinal needle. The needle was advanced until radicular pain was elicited. Due to the patient's pain, size and location of the recurrent disc herniation, and postoperative changes, a more lateral final needle position was necessary compared to what is targeted for a more typical transforaminal injection. Injection of Isovue-M 200 outlined the left L3 nerve root and showed predominantly peripheral spread. No vascular opacification is seen. 120 mg of Depo-Medrol mixed with 2 mL of 1% lidocaine were instilled. The procedure was well-tolerated, and the patient was discharged thirty minutes following the  injection in good condition. He reported complete pain relief at the time of discharge. COMPLICATIONS: None IMPRESSION: Technically successful injection consisting of a left L3 nerve root block and transforaminal epidural. Electronically Signed   By: Logan Bores M.D.   On: 10/04/2016 08:06   Dg C-arm 61-120 Min  Result Date: 10/17/2016 CLINICAL DATA:  Status post XL IF at L3-4. Reported fluoro time is 1 minutes 59 seconds. EXAM: DG C-ARM 61-120 MIN COMPARISON:  Fluoro spot images of October 03, 2016. FINDINGS: An interdiscal cage has been placed at L3-4. Radiographic positioning is good. IMPRESSION: No immediate postprocedure complication following XL IF at L3-4. Electronically Signed   By: David  Martinique M.D.   On: 10/17/2016 13:44   Dg C-arm Gt 120 Min  Result Date: 10/18/2016 CLINICAL DATA:  Posterior fusion of L3-4. EXAM: DG C-ARM GT 120 MIN; LUMBAR SPINE - 2-3 VIEW CONTRAST:  None. FLUOROSCOPY TIME:  Fluoroscopy Time:  3 minutes 50 seconds. Number of Acquired Spot Images: 3 COMPARISON:  October 17, 2016.  December 01, 2015. FINDINGS: Status post surgical posterior fusion of L3-4 with bilateral intrapedicular screw placement and interbody fusion. Good alignment of vertebral bodies is noted. IMPRESSION: Status post surgical posterior fusion of L3-4 bilateral intrapedicular screw placement. Electronically Signed   By: Marijo Conception, M.D.   On: 10/18/2016 15:38    Disposition: 01-Home or Self Care  Post op medications provided Pt will present to clinic in 2 weeks  Discharge Instructions    Incentive spirometry RT    Complete by:  As directed    Incentive spirometry RT    Complete by:  As directed          Signed: Valinda Hoar 10/24/2016, 10:36 AM

## 2016-11-22 DIAGNOSIS — K7210 Chronic hepatic failure without coma: Secondary | ICD-10-CM | POA: Insufficient documentation

## 2016-11-22 DIAGNOSIS — E785 Hyperlipidemia, unspecified: Secondary | ICD-10-CM | POA: Insufficient documentation

## 2016-11-30 NOTE — Addendum Note (Signed)
Addendum  created 11/30/16 1339 by Effie Berkshire, MD   Sign clinical note

## 2016-12-03 NOTE — Addendum Note (Signed)
Addendum  created 12/03/16 1409 by Oleta Mouse, MD   Sign clinical note

## 2017-01-15 MED FILL — MYFORTIC/180MG/TAB: MYFORTIC/180MG/TAB | 30 days supply | Qty: 60 | Fill #1

## 2017-01-15 MED FILL — PROGRAF/0.5MG/CAP: PROGRAF/0.5MG/CAP | 30 days supply | Qty: 30 | Fill #1

## 2017-01-15 MED FILL — PROGRAF/1MG/CAP: PROGRAF/1MG/CAP | 30 days supply | Qty: 60 | Fill #5

## 2017-01-30 NOTE — Unmapped (Signed)
Patient called to relay as [facility] is no longer a [facility] he plans to complete routine labs @ local [facility].  Discussed we encourage patients to complete labs monthly however Dr. Julieta Gutting was agreeable for patient to have labs drawn every other month - he verbalized understanding.  Entered standing order for [facility].

## 2017-02-01 LAB — A/G RATIO: Lab: 1.5

## 2017-02-01 LAB — COMPREHENSIVE METABOLIC PANEL
ALBUMIN: 3.9 g/dL (ref 3.6–4.8)
ALKALINE PHOSPHATASE: 100 IU/L (ref 39–117)
ALT (SGPT): 14 IU/L (ref 0–44)
AST (SGOT): 14 IU/L (ref 0–40)
BILIRUBIN TOTAL: 0.5 mg/dL (ref 0.0–1.2)
BLOOD UREA NITROGEN: 13 mg/dL (ref 8–27)
BUN / CREAT RATIO: 10 (ref 10–24)
CALCIUM: 8.8 mg/dL (ref 8.6–10.2)
CO2: 22 mmol/L (ref 20–29)
CREATININE: 1.28 mg/dL — ABNORMAL HIGH (ref 0.76–1.27)
GLOBULIN, TOTAL: 2.6 g/dL (ref 1.5–4.5)
GLUCOSE: 116 mg/dL — ABNORMAL HIGH (ref 65–99)
POTASSIUM: 4 mmol/L (ref 3.5–5.2)
SODIUM: 140 mmol/L (ref 134–144)

## 2017-02-01 LAB — GAMMA GLUTAMYL TRANSFERASE: Lab: 18

## 2017-02-01 LAB — CBC W/ DIFFERENTIAL
BANDED NEUTROPHILS ABSOLUTE COUNT: 0 10*3/uL (ref 0.0–0.1)
BASOPHILS ABSOLUTE COUNT: 0 10*3/uL (ref 0.0–0.2)
BASOPHILS RELATIVE PERCENT: 1 %
EOSINOPHILS ABSOLUTE COUNT: 0.1 10*3/uL (ref 0.0–0.4)
EOSINOPHILS RELATIVE PERCENT: 2 %
HEMATOCRIT: 44.6 % (ref 37.5–51.0)
HEMOGLOBIN: 16 g/dL (ref 13.0–17.7)
IMMATURE GRANULOCYTES: 1 %
LYMPHOCYTES ABSOLUTE COUNT: 1.5 10*3/uL (ref 0.7–3.1)
LYMPHOCYTES RELATIVE PERCENT: 37 %
MEAN CORPUSCULAR HEMOGLOBIN CONC: 35.9 g/dL — ABNORMAL HIGH (ref 31.5–35.7)
MEAN CORPUSCULAR VOLUME: 86 fL (ref 79–97)
MONOCYTES ABSOLUTE COUNT: 0.4 10*3/uL (ref 0.1–0.9)
NEUTROPHILS ABSOLUTE COUNT: 2 10*3/uL (ref 1.4–7.0)
NEUTROPHILS RELATIVE PERCENT: 50 %
PLATELET COUNT: 121 10*3/uL — ABNORMAL LOW (ref 150–379)
RED BLOOD CELL COUNT: 5.19 x10E6/uL (ref 4.14–5.80)
RED CELL DISTRIBUTION WIDTH: 14.7 % (ref 12.3–15.4)
WHITE BLOOD CELL COUNT: 4 10*3/uL (ref 3.4–10.8)

## 2017-02-01 LAB — MEAN CORPUSCULAR HEMOGLOBIN CONC: Lab: 35.9 — ABNORMAL HIGH

## 2017-02-01 LAB — BILIRUBIN DIRECT: Lab: 0.11

## 2017-02-01 LAB — MAGNESIUM: Lab: 1.7

## 2017-02-01 LAB — PHOSPHORUS, SERUM: Lab: 2.8

## 2017-02-03 LAB — TACROLIMUS BLOOD: Lab: 4

## 2017-02-06 NOTE — Unmapped (Signed)
Specialty Pharmacy Refill Coordination Note     Trevor Weber is a 71 y.o. male contacted today regarding refills of his specialty medication(s).    Reviewed and verified with patient:      Specialty medication(s) and dose(s) confirmed: yes  Changes to medications: no  Changes to insurance: no    Medication Adherence    Patient reported X missed doses in the last month:  0  Specialty Medication:  MYFORTIC 180  Patient is on additional specialty medications:  Yes  Additional Specialty Medications:  PROGRAF 0.5MG  AND 1MG    Patient Reported Additional Medication X Missed Doses in the Last Month:  0  Informant:  patient  Medication Assistance Program  Refill Coordination  Has the Patient's Contact Information Changed:  No  Is the Shipping Address Different:  No  Shipping Information  Delivery Scheduled:  Yes  Delivery Date:  02/13/17  Medications to be Shipped:  MYFORTIC AND PROGRAF     MYFORTIC 180MG  LAST QTY 60 TABS WITH 14 REMAINING. PROGRAF 0.5 LAST FILLED 30 WITH 7 REMAINING. PROGRAF 1 LAST FILLED 60 WITH 14 REMAINING.     Follow-up: 3 week(s)     Roselyn Meier  Specialty Pharmacy Technician

## 2017-02-12 MED FILL — PROGRAF/1MG/CAP: PROGRAF/1MG/CAP | 30 days supply | Qty: 60 | Fill #6

## 2017-02-12 MED FILL — MYFORTIC/180MG/TAB: MYFORTIC/180MG/TAB | 30 days supply | Qty: 60 | Fill #2

## 2017-02-12 MED FILL — PROGRAF/0.5MG/CAP: PROGRAF/0.5MG/CAP | 30 days supply | Qty: 30 | Fill #2

## 2017-02-15 NOTE — Unmapped (Signed)
Per Dr. Veronda Prude request kidney coordinator has previously ordered patient to have repeat MRI for possible IPMNs - (original MRI was suggested to be repeated in 1 year per urology visit following annual MRI findings but was scheduled earlier per Dr. Veronda Prude to eval IPMNs).  Received forwarded notification from radiologist order should be for MRI/MRCP instead of MRI only to eval this - updated order in EPIC and had [facility]PA call to ensure MRI/MRCP is linked to upcoming appointment instead of old MRI order.

## 2017-02-25 ENCOUNTER — Ambulatory Visit
Admission: RE | Admit: 2017-02-25 | Discharge: 2017-02-25 | Disposition: A | Discharge status: Home / Self Care | Payer: MEDICARE

## 2017-02-25 DIAGNOSIS — Z944 Liver transplant status: Principal | ICD-10-CM

## 2017-02-25 DIAGNOSIS — Z940 Kidney transplant status: Secondary | ICD-10-CM

## 2017-03-05 NOTE — Unmapped (Signed)
[facility] Specialty Pharmacy Refill and Clinical Coordination Note  Medication(s): PROGRAF 1, PROGRAF 0.5, MYFORTIC [address] [place], [place]: May 13, 1946  Phone: [phone] (home) , Alternate phone contact: N/A  Shipping address: [address]  [place] [address]  Phone or address changes today?: No  All above HIPAA information verified.  Insurance changes? No    Completed refill and clinical call assessment today to schedule patient's medication shipment from the [facility] [phone]).      MEDICATION RECONCILIATION    Confirmed the medication and dosage are correct and have not changed: Yes, regimen is correct and unchanged.    Were there any changes to your medication(s) in the past month:  No, there are no changes reported at this time.    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    Prograf 0.5 mg   Quantity filled last month: 30   # of tablets left on hand: 10     Prograf 1 mg   Quantity filled last month: 60   # of tablets left on hand: 20      Myfortic 180 mg   Quantity filled last month: 60   # of tablets left on hand: 20    Did you miss any doses in the past 4 weeks? No missed doses reported.  Adherence counseling provided? Not needed     SIDE EFFECT MANAGEMENT    Are you tolerating your medication?:  Trevor Weber reports tolerating the medication.  Side effect management discussed: None      Therapy is appropriate and should be continued.    Evidence of clinical benefit: See Epic note from 02/12/17      FINANCIAL/SHIPPING    Delivery Scheduled: Yes, Expected medication delivery date: 03/11/17   Additional medications refilled: No additional medications/refills needed at this time.    Trevor Weber did not have any additional questions at this time.    Delivery address validated in [facility] scheduling system: Yes, address listed above is correct.      We will follow up with patient monthly for standard refill processing and delivery.      Thank you,  [name]   [facility] Specialty Pharmacist

## 2017-03-08 MED FILL — PROGRAF/1MG/CAP: PROGRAF/1MG/CAP | 30 days supply | Qty: 60 | Fill #7

## 2017-03-08 MED FILL — PROGRAF/0.5MG/CAP: PROGRAF/0.5MG/CAP | 30 days supply | Qty: 30 | Fill #3

## 2017-03-08 MED FILL — MYFORTIC/180MG/TAB: MYFORTIC/180MG/TAB | 30 days supply | Qty: 60 | Fill #3

## 2017-03-12 NOTE — Unmapped (Signed)
Forwarded recent MRI/MRCP results to Dr. Veronda Prude as report mentions follow-up imaging suggested.  Patient will repeat MRI @ annual visit ~ February.  Relayed this to patient by phone and shared if Dr. Veronda Prude has any futher recommendations for f/u other than this will call to relay to patient.

## 2017-03-28 NOTE — Unmapped (Signed)
[facility] Specialty Pharmacy Refill Coordination Note  Specialty Medication(s): PROGRAF 0.5MG , PROGRAF 1MG , MYFORTIC 180MG   Additional Medications shipped: NO    Trevor Weber, DOB: [birth year]  Phone: [phone] (home) , Alternate phone contact: N/A  Phone or address changes today?: No  All above HIPAA information was verified with patient.  Shipping Address: [address]  [place] [address] SHIP TO NEIGHBOR: [address]  Insurance changes? No    Completed refill call assessment today to schedule patient's medication shipment from the [facility] [phone]).      Confirmed the medication and dosage are correct and have not changed: Yes, regimen is correct and unchanged.    Confirmed patient started or stopped the following medications in the past month:  No, there are no changes reported at this time.    Are you tolerating your medication?:  Trevor Weber reports tolerating the medication.    ADHERENCE    Prograf 0.5 mg   Quantity filled last month: 30   # of tablets left on hand: 14      Prograf 1 mg   Quantity filled last month: 60   # of tablets left on hand: 28      Myfortic 180 mg   Quantity filled last month: 60   # of tablets left on hand: 28    Did you miss any doses in the past 4 weeks? No missed doses reported.    FINANCIAL/SHIPPING    Delivery Scheduled: Yes, Expected medication delivery date: 04/05/17     Trevor Weber did not have any additional questions at this time.    Delivery address validated in FSI scheduling system: Yes, address listed in FSI is correct.    We will follow up with patient monthly for standard refill processing and delivery.      Thank you,  Marletta Lor   [facility] Specialty Pharmacist

## 2017-04-04 MED FILL — MYFORTIC/180MG/TAB: MYFORTIC/180MG/TAB | 30 days supply | Qty: 60 | Fill #4

## 2017-04-04 MED FILL — PROGRAF/1MG/CAP: PROGRAF/1MG/CAP | 30 days supply | Qty: 60 | Fill #8

## 2017-04-04 MED FILL — PROGRAF/0.5MG/CAP: PROGRAF/0.5MG/CAP | 30 days supply | Qty: 30 | Fill #4

## 2017-05-01 NOTE — Unmapped (Signed)
Specialty Pharmacy Refill Coordination Note     Trevor Weber is a 71 y.o. male contacted today regarding refills of his specialty medication(s).    Reviewed and verified with patient:     [address]  [place] [address]    Specialty medication(s) and dose(s) confirmed: yes  Changes to medications: no  Changes to insurance: no    Medication Adherence    Patient reported X missed doses in the last month:  0  Specialty Medication:  Prograf 1mg   Patient is on additional specialty medications:  Yes  Additional Specialty Medications:  Prograf 0.5mg   Patient Reported Additional Medication X Missed Doses in the Last Month:  0  Patient is on more than two specialty medications:  Yes  Specialty Medication:  Myfortic 180mg   Patient Reported Additional Medication X Missed Doses in the Last Month:  0  Informant:  patient  Reliability of informant:  reliable  Provider-estimated medication adherence level:  90-100%  Confirmed plan for next specialty medication refill:  delivery by pharmacy         Refill Coordination    Has the Patients' Contact Information Changed:  No  Is the Shipping Address Different:  No       Shipping Information    Delivery Scheduled:  Yes  Delivery Date:  05/06/17  Medications to be Shipped:  Prograf 1mg , Prograf 0.5mg , Myfortic 180mg           Prograf 0.5 mg   Quantity filled last month: 30   # of tablets left on hand: 4    Prograf 1 mg   Quantity filled last month: 60   # of tablets left on hand: 8    Myfortic 180 mg   Quantity filled last month: 60   # of tablets left on hand: 4         [name] Programmer, systems

## 2017-05-03 MED FILL — PROGRAF/0.5MG/CAP: PROGRAF/0.5MG/CAP | 30 days supply | Qty: 30 | Fill #5

## 2017-05-03 MED FILL — PROGRAF/1MG/CAP: PROGRAF/1MG/CAP | 30 days supply | Qty: 60 | Fill #9

## 2017-05-03 MED FILL — MYFORTIC/180MG/TAB: MYFORTIC/180MG/TAB | 30 days supply | Qty: 60 | Fill #5

## 2017-05-21 LAB — CBC W/ DIFFERENTIAL
BANDED NEUTROPHILS ABSOLUTE COUNT: 0 10*3/uL (ref 0.0–0.1)
BASOPHILS ABSOLUTE COUNT: 0 10*3/uL (ref 0.0–0.2)
BASOPHILS RELATIVE PERCENT: 1 %
EOSINOPHILS ABSOLUTE COUNT: 0.1 10*3/uL (ref 0.0–0.4)
EOSINOPHILS RELATIVE PERCENT: 2 %
HEMATOCRIT: 47.1 % (ref 37.5–51.0)
HEMOGLOBIN: 15.8 g/dL (ref 13.0–17.7)
IMMATURE GRANULOCYTES: 1 %
LYMPHOCYTES ABSOLUTE COUNT: 1.7 10*3/uL (ref 0.7–3.1)
MEAN CORPUSCULAR HEMOGLOBIN CONC: 33.5 g/dL (ref 31.5–35.7)
MEAN CORPUSCULAR VOLUME: 91 fL (ref 79–97)
MONOCYTES ABSOLUTE COUNT: 0.3 10*3/uL (ref 0.1–0.9)
MONOCYTES RELATIVE PERCENT: 8 %
NEUTROPHILS ABSOLUTE COUNT: 1.9 10*3/uL (ref 1.4–7.0)
NEUTROPHILS RELATIVE PERCENT: 46 %
PLATELET COUNT: 130 10*3/uL — ABNORMAL LOW (ref 150–379)
RED BLOOD CELL COUNT: 5.2 x10E6/uL (ref 4.14–5.80)
RED CELL DISTRIBUTION WIDTH: 14.7 % (ref 12.3–15.4)
WHITE BLOOD CELL COUNT: 4.1 10*3/uL (ref 3.4–10.8)

## 2017-05-21 LAB — COMPREHENSIVE METABOLIC PANEL
A/G RATIO: 1.5 (ref 1.2–2.2)
ALKALINE PHOSPHATASE: 108 IU/L (ref 39–117)
ALT (SGPT): 16 IU/L (ref 0–44)
AST (SGOT): 16 IU/L (ref 0–40)
BILIRUBIN TOTAL: 0.7 mg/dL (ref 0.0–1.2)
BLOOD UREA NITROGEN: 19 mg/dL (ref 8–27)
BUN / CREAT RATIO: 15 (ref 10–24)
CALCIUM: 8.8 mg/dL (ref 8.6–10.2)
CHLORIDE: 106 mmol/L (ref 96–106)
CO2: 21 mmol/L (ref 20–29)
GLOBULIN, TOTAL: 2.6 g/dL (ref 1.5–4.5)
GLUCOSE: 113 mg/dL — ABNORMAL HIGH (ref 65–99)
POTASSIUM: 4.2 mmol/L (ref 3.5–5.2)
TOTAL PROTEIN: 6.6 g/dL (ref 6.0–8.5)

## 2017-05-21 LAB — BILIRUBIN DIRECT: Lab: 0.15

## 2017-05-21 LAB — PLATELET COUNT: Lab: 130 — ABNORMAL LOW

## 2017-05-21 LAB — GAMMA GLUTAMYL TRANSFERASE: Lab: 20

## 2017-05-21 LAB — MAGNESIUM
Lab: 1.7
MAGNESIUM: 1.7 mg/dL (ref 1.6–2.3)

## 2017-05-21 LAB — PHOSPHORUS, SERUM: Lab: 2.7

## 2017-05-21 LAB — CREATININE: Lab: 1.31 — ABNORMAL HIGH

## 2017-05-21 NOTE — Unmapped (Signed)
Spoke with patient to review recent labs were stable - he confirms he saw his PCP today who will discuss maintenance regimen for suppressing gout with Dr. Veronda Prude.

## 2017-05-22 LAB — TACROLIMUS LEVEL: TACROLIMUS BLOOD: 5.4 ng/mL (ref 2.0–20.0)

## 2017-05-22 LAB — TACROLIMUS BLOOD: Lab: 5.4

## 2017-05-27 NOTE — Unmapped (Signed)
Patient does not need a refill of specialty medication at this time. Moving specialty refill reminder call to appropriate date and removed call attempt data. Spoke with patient & he states he does not need any refills at this time, and would like a call back on 06/10/17 to reaccess refills of medications at that time.

## 2017-06-07 NOTE — Unmapped (Addendum)
Patient called to relay his local provider Dr. Delane Ginger put him on allopurinol for gout once daily for 30 days and colchicine qod for one month and then plans to check uric acid level - he called to ensure this is safe - forwarded to Jennette Banker, PharmD.  Per RuthAnn this regimen is safe - updated medication list to reflect this dosing.

## 2017-06-12 NOTE — Unmapped (Signed)
[facility] Specialty Pharmacy Refill Coordination Note  Specialty Medication(s): PROGRAF .5MG   PROGRAF 1MG   MYFORTIC 180MG       Trevor Weber, DOB: 07/10/1945  Phone: [phone] (home) , Alternate phone contact: N/A  Phone or address changes today?: No  All above HIPAA information was verified with patient.  Shipping Address: [address]  [place] [address]   Insurance changes? No    Completed refill call assessment today to schedule patient's medication shipment from the [facility] [phone]).      Confirmed the medication and dosage are correct and have not changed: Yes, regimen is correct and unchanged.    Confirmed patient started or stopped the following medications in the past month:  No, there are no changes reported at this time.    Are you tolerating your medication?:  Trevor Weber reports tolerating the medication.    ADHERENCE    Prograf 0.5 mg   Quantity filled last month: 30   # of tablets left on hand: 6      Prograf 1 mg   Quantity filled last month: 60   # of tablets left on hand: 12      Myfortic 180 mg   Quantity filled last month: 60   # of tablets left on hand: 12      Did you miss any doses in the past 4 weeks? No missed doses reported.    FINANCIAL/SHIPPING    Delivery Scheduled: Yes, Expected medication delivery date: 06/17/17     Trevor Weber did not have any additional questions at this time.    Delivery address validated in FSI scheduling system: Yes, address listed in FSI is correct.    We will follow up with patient monthly for standard refill processing and delivery.      Thank you,  Trevor Gambles   [facility] Specialty Technician

## 2017-06-14 MED FILL — MYFORTIC/180MG/TAB: MYFORTIC/180MG/TAB | 30 days supply | Qty: 60 | Fill #6

## 2017-06-14 MED FILL — PROGRAF/1MG/CAP: PROGRAF/1MG/CAP | 30 days supply | Qty: 60 | Fill #10

## 2017-06-14 MED FILL — PROGRAF/0.5MG/CAP: PROGRAF/0.5MG/CAP | 30 days supply | Qty: 30 | Fill #6

## 2017-07-05 NOTE — Unmapped (Signed)
[facility] Specialty Pharmacy Refill and Clinical Coordination Note  Medication(s): MYFORTIC 180MG , PROGRAF 0.5MG , PROGRAF 1MG     Trevor Weber [place], DOB: 1945-10-13  Phone: [phone] (home) , Alternate phone contact: N/A  Shipping address: [address]  [place] [address]  Phone or address changes today?: No  All above HIPAA information verified.  Insurance changes? No    Completed refill and clinical call assessment today to schedule patient's medication shipment from the [facility] [phone]).      MEDICATION RECONCILIATION    Confirmed the medication and dosage are correct and have not changed: Yes, regimen is correct and unchanged.    Were there any changes to your medication(s) in the past month:  No, there are no changes reported at this time.    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    Myfortic 180 mg   Quantity filled last month: 60   # of tablets left on hand: 20  Prograf 0.5 mg   Quantity filled last month: 30   # of tablets left on hand: 10  Prograf 1 mg   Quantity filled last month: 60   # of tablets left on hand: 20      Did you miss any doses in the past 4 weeks? No missed doses reported.  Adherence counseling provided? Not needed     SIDE EFFECT MANAGEMENT    Are you tolerating your medication?:  [name] reports tolerating the medication.  Side effect management discussed: None      Therapy is appropriate and should be continued.    Evidence of clinical benefit: See Epic note from 11/16/16      FINANCIAL/SHIPPING    Delivery Scheduled: Yes, Expected medication delivery date: 07/11/17   Additional medications refilled: No additional medications/refills needed at this time.    [name] did not have any additional questions at this time.    Delivery address validated in FSI scheduling system: Yes, address listed above is correct.      We will follow up with patient monthly for standard refill processing and delivery.      Thank you,  Thad Ranger   [facility] Specialty Pharmacist

## 2017-07-10 MED FILL — PROGRAF/0.5MG/CAP: PROGRAF/0.5MG/CAP | 30 days supply | Qty: 30 | Fill #7

## 2017-07-10 MED FILL — MYFORTIC/180MG/TAB: MYFORTIC/180MG/TAB | 30 days supply | Qty: 60 | Fill #7

## 2017-07-10 MED FILL — PROGRAF/1MG/CAP: PROGRAF/1MG/CAP | 30 days supply | Qty: 60 | Fill #11

## 2017-07-15 NOTE — Unmapped (Signed)
Called patient to discuss scheduling liver transplant annual appointments, patient confirmed 08/20/17 will work, appointment letter mailed to patient.

## 2017-07-31 NOTE — Unmapped (Signed)
Patient does not need a refill of specialty medication at this time. Moving specialty refill reminder call to appropriate date and removed call attempt data.  Spoke with patient and he states he says 2 weeks worth of medication.  Does not need any refills at this time.

## 2017-08-08 MED ORDER — PROGRAF 1 MG CAPSULE: capsule | 6 refills | 0 days

## 2017-08-08 MED ORDER — PROGRAF 1 MG CAPSULE
ORAL_CAPSULE | ORAL | 5 refills | 0.00000 days | Status: CP
Start: 2017-08-08 — End: 2017-08-08

## 2017-08-08 MED ORDER — MYFORTIC 180 MG TABLET,DELAYED RELEASE
ORAL_TABLET | Freq: Two times a day (BID) | ORAL | 3 refills | 0.00000 days | Status: CP
Start: 2017-08-08 — End: 2017-08-08

## 2017-08-08 MED ORDER — PROGRAF 0.5 MG CAPSULE
ORAL_CAPSULE | 3 refills | 0 days | Status: CP
Start: 2017-08-08 — End: 2017-12-06

## 2017-08-08 MED ORDER — PROGRAF 1 MG CAPSULE: capsule | 5 refills | 0 days

## 2017-08-08 MED ORDER — MYFORTIC 180 MG TABLET,DELAYED RELEASE: tablet | 3 refills | 0 days

## 2017-08-08 NOTE — Unmapped (Signed)
[facility] Specialty Pharmacy Refill Coordination Note  Specialty Medication(s): Prograf 0.5mg  & 1mg  & Myfortic 180mg     Trevor Weber, DOB: 09/13/1945  Phone: [phone] (home) , Alternate phone contact: N/A  Phone or address changes today?: No  All above HIPAA information was verified with patient.  Shipping Address: [address]  [place] [address]   Insurance changes? No    Completed refill call assessment today to schedule patient's medication shipment from the [facility] [phone]).      Confirmed the medication and dosage are correct and have not changed: Yes, regimen is correct and unchanged.    Confirmed patient started or stopped the following medications in the past month:  No, there are no changes reported at this time.    Are you tolerating your medication?:  [name] reports tolerating the medication.    ADHERENCE    (Below is required for Medicare Part B or Transplant patients only - per drug):   How many tablets were dispensed last month:     Myfortic 180 mg   Quantity filled last month: 60   # of tablets left on hand: 10 DAYS    Prograf 0.5 mg   Quantity filled last month: 30   # of tablets left on hand: 10 DAYS    Prograf 1 mg   Quantity filled last month: 60   # of tablets left on hand: 10 DAYS    Did you miss any doses in the past 4 weeks? No missed doses reported.    FINANCIAL/SHIPPING    Delivery Scheduled: Yes, Expected medication delivery date: 08/15/2017     [name] did not have any additional questions at this time.    Delivery address validated in FSI scheduling system: Yes, address listed in FSI is correct.    We will follow up with patient monthly for standard refill processing and delivery.      Thank you,  Tamala Fothergill   [facility] Specialty Technician

## 2017-08-14 MED FILL — PROGRAF/0.5MG/CAP: PROGRAF/0.5MG/CAP | 30 days supply | Qty: 30 | Fill #8

## 2017-08-14 MED FILL — PROGRAF/1MG/CAP: PROGRAF/1MG/CAP | 30 days supply | Qty: 60 | Fill #0

## 2017-08-14 MED FILL — MYFORTIC/180MG/TAB: MYFORTIC/180MG/TAB | 30 days supply | Qty: 60 | Fill #8

## 2017-08-20 ENCOUNTER — Ambulatory Visit: Admit: 2017-08-20 | Discharge: 2017-08-20 | Payer: MEDICARE

## 2017-08-20 DIAGNOSIS — Z944 Liver transplant status: Principal | ICD-10-CM

## 2017-08-20 DIAGNOSIS — Z23 Encounter for immunization: Secondary | ICD-10-CM

## 2017-08-20 DIAGNOSIS — Z940 Kidney transplant status: Secondary | ICD-10-CM

## 2017-08-20 DIAGNOSIS — Z6841 Body Mass Index (BMI) 40.0 and over, adult: Secondary | ICD-10-CM

## 2017-08-20 DIAGNOSIS — C220 Liver cell carcinoma: Secondary | ICD-10-CM

## 2017-08-20 DIAGNOSIS — Z79899 Other long term (current) drug therapy: Secondary | ICD-10-CM

## 2017-08-20 DIAGNOSIS — D899 Disorder involving the immune mechanism, unspecified: Secondary | ICD-10-CM

## 2017-08-20 LAB — CBC W/ AUTO DIFF
BASOPHILS ABSOLUTE COUNT: 0 10*9/L (ref 0.0–0.1)
BASOPHILS RELATIVE PERCENT: 1 %
EOSINOPHILS ABSOLUTE COUNT: 0.1 10*9/L (ref 0.0–0.4)
EOSINOPHILS RELATIVE PERCENT: 2.9 %
HEMATOCRIT: 45.4 % (ref 41.0–53.0)
HEMOGLOBIN: 15.5 g/dL (ref 13.5–17.5)
LARGE UNSTAINED CELLS: 2 % (ref 0–4)
LYMPHOCYTES ABSOLUTE COUNT: 1.3 10*9/L — ABNORMAL LOW (ref 1.5–5.0)
LYMPHOCYTES RELATIVE PERCENT: 36.9 %
MEAN CORPUSCULAR HEMOGLOBIN CONC: 34.2 g/dL (ref 31.0–37.0)
MEAN CORPUSCULAR HEMOGLOBIN: 29.7 pg (ref 26.0–34.0)
MEAN CORPUSCULAR VOLUME: 86.8 fL (ref 80.0–100.0)
MEAN PLATELET VOLUME: 8.6 fL (ref 7.0–10.0)
MONOCYTES ABSOLUTE COUNT: 0.3 10*9/L (ref 0.2–0.8)
NEUTROPHILS ABSOLUTE COUNT: 1.8 10*9/L — ABNORMAL LOW (ref 2.0–7.5)
NEUTROPHILS RELATIVE PERCENT: 49.3 %
RED BLOOD CELL COUNT: 5.23 10*12/L (ref 4.50–5.90)
RED CELL DISTRIBUTION WIDTH: 14.6 % (ref 12.0–15.0)
WBC ADJUSTED: 3.6 10*9/L — ABNORMAL LOW (ref 4.5–11.0)

## 2017-08-20 LAB — COMPREHENSIVE METABOLIC PANEL
ALKALINE PHOSPHATASE: 111 U/L (ref 38–126)
ALT (SGPT): 25 U/L (ref 19–72)
ANION GAP: 7 mmol/L — ABNORMAL LOW (ref 9–15)
AST (SGOT): 21 U/L (ref 19–55)
BILIRUBIN TOTAL: 0.8 mg/dL (ref 0.0–1.2)
BLOOD UREA NITROGEN: 17 mg/dL (ref 7–21)
BUN / CREAT RATIO: 14
CALCIUM: 8.7 mg/dL (ref 8.5–10.2)
CHLORIDE: 105 mmol/L (ref 98–107)
CO2: 28 mmol/L (ref 22.0–30.0)
CREATININE: 1.22 mg/dL (ref 0.70–1.30)
EGFR MDRD NON AF AMER: 59 mL/min/{1.73_m2} — ABNORMAL LOW (ref >=60)
GLUCOSE RANDOM: 115 mg/dL (ref 65–179)
POTASSIUM: 4.4 mmol/L (ref 3.5–5.0)
PROTEIN TOTAL: 6.5 g/dL (ref 6.5–8.3)
SODIUM: 140 mmol/L (ref 135–145)

## 2017-08-20 LAB — TACROLIMUS, TROUGH: Lab: 4.3

## 2017-08-20 LAB — AFP-TUMOR MARKER: Alpha-1-Fetoprotein.tumor marker:MCnc:Pt:Ser/Plas:Qn:: 2.35

## 2017-08-20 LAB — PHOSPHORUS: Phosphate:MCnc:Pt:Ser/Plas:Qn:: 3.1

## 2017-08-20 LAB — BILIRUBIN DIRECT: Bilirubin.glucuronidated:MCnc:Pt:Ser/Plas:Qn:: 0.4

## 2017-08-20 LAB — MAGNESIUM: Magnesium:MCnc:Pt:Ser/Plas:Qn:: 1.7

## 2017-08-20 LAB — WBC ADJUSTED: Lab: 3.6 — ABNORMAL LOW

## 2017-08-20 LAB — ALT (SGPT): Alanine aminotransferase:CCnc:Pt:Ser/Plas:Qn:: 25

## 2017-08-20 LAB — GAMMA GLUTAMYL TRANSFERASE: Gamma glutamyl transferase:CCnc:Pt:Ser/Plas:Qn:: 26

## 2017-08-20 NOTE — Unmapped (Signed)
FOLLOW UP ANNUAL LIVER CLINIC NOTE     Patient Name: Trevor Weber Trevor Weber  Medical Record Number: [id number]  Date of Service: 08/20/2017    Referring Physician: Jay Weber   Current complaint: Follow up Annual Liver    Assessment/Plan:     Trevor Weber is a 72 y.o. 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  Recent testing/images WNL and immunosuppressive levels were within goal range (4-6).   He takes his medication at 9AM/9PM. Continue tacrolimus 1.5mg /1mg  and myfortic 180mg  BID.     He has had significant BP elevations while in clinic, although he reports 140/60s at home. I encouraged him to follow up with his PCP and follow a renal and low salt diet. Possibly start agent at next visit if still high. He also reports taking 15 colchicine pills over the last 2-3 months for gout flares, he reports these just happen not dietary induced or stressed induced. Consider additional investigation at subsequent nephrology and PCP visit.    He is also aware that he has not lost any weight although he should. I encouraged him it will prevent fatty liver reaccumulation and prevent worsening of hernia.     HEALTH MAINTENANCE:   - Dermatology: left forearm hot spot with dark coloring s/p biopsy   - Colonoscopy: 2018 (10 year plan)  - Dental: 6 months   - Mental health: no concerns, finds significant gratification in mentoring peers who undergo tranplant  Immunization History   Administered Date(s) Administered   ??? Hepatitis B, Adult 03/12/2011, 04/09/2011, 09/03/2011, 05/16/2012, 06/02/2012, 06/16/2012   ??? INFLUENZA TIV (TRI) 47MO+ W/ PRESERV (IM) 07/16/2012   ??? INFLUENZA TIV (TRI) PF (IM) 03/12/2011   ??? Influenza Vaccine Quad (IIV4 PF) 33mo+ injectable 03/30/2015, 03/21/2016   ??? Influenza Virus Vaccine, unspecified formulation 03/16/2013   ??? PNEUMOCOCCAL POLYSACCHARIDE 23 03/18/2013   ??? PPD Test 06/25/2011, 10/16/2013   ??? Pneumococcal Conjugate 13-Valent 06/01/2015   ??? SHINGRIX-ZOSTER VACCINE (HZV), RECOMBINANT,SUB-UNIT,ADJUVANTED IM 08/20/2017   ??? Tetanus and diptheria, adsorbed,(adult), 5Lf tetanus toxoid,PF 10/16/2013     Shingrix #1 today, #2 follow up at [facility] for nurse visit.     Return to clinic: 1 year  Labs: monthly    I spent a total of 25 minutes of which 50% was spent in counseling and coordination of care.    Subjective:     HPI: Trevor Weber is a 72 y.o. 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 He has had minimal changes to health since previous visit. His posttransplant course has been free of rejection episodes, HCC recurrence, or DSA detection. Baseline serum creatinine has been 1.2-1.4 mg/dL. He does report in the last 2-3 months he has been suffering with gout taking colcrys with symptoms improvement. He has not been hospitalized or ill.    Denies fever, chills, arthralgias, weight loss/gain, and fatigue. Denies chest pain, SOB, N/V/D, constipation or abdominal pain. No acute complaint today.    Past Medical History:   Diagnosis Date   ??? Actinic keratosis    ??? End-stage liver disease (CMS-HCC)    ??? ESRD (end stage renal disease) (CMS-HCC)    ??? HCC (hepatocellular carcinoma) (CMS-HCC)     s/p chemoembolization on 01/2012   ??? IgA nephropathy    ??? NASH (nonalcoholic steatohepatitis)        Past Surgical History:   Procedure Laterality Date   ??? AV FISTULA PLACEMENT  Left    ??? CARPAL TUNNEL RELEASE Bilateral    ??? liver tranplant     ??? NEPHRECTOMY TRANSPLANTED ORGAN     ??? PR LIGATN ANGIOACCESS AV FISTULA Left 09/08/2013    Procedure: LIGATION OR BANDING OF ANGIOACCESS ARTERIOVENOUS FISTULA;  Surgeon: Trevor Mouton, MD;  Location: MAIN OR [facility];  Service: Transplant   ??? UMBILICAL HERNIA REPAIR         Family History   Problem Relation Age of Onset   ??? No Known Problems Mother    ??? No Known Problems Father    ??? Kidney cancer Neg Hx    ??? Melanoma Neg Hx    ??? Basal cell carcinoma Neg Hx    ??? Squamous cell carcinoma Neg Hx    ??? Anesthesia problems Neg Hx    ??? Bleeding Disorder Neg Hx        Social History     Social History   ??? Marital status: Married     Spouse name: N/A   ??? Number of children: N/A   ??? Years of education: college     Occupational History   ??? retired      Social History Main Topics   ??? Smoking status: Never Smoker   ??? Smokeless tobacco: Never Used   ??? Alcohol use No   ??? Drug use: No   ??? Sexual activity: Not on file     Other Topics Concern   ??? Do You Use Sunscreen? Yes   ??? Tanning Bed Use? No   ??? Are You Easily Burned? No   ??? Excessive Sun Exposure? No   ??? Blistering Sunburns? No     Social History Narrative   ??? No narrative on file       REVIEW OF SYSTEMS:   The balance of 10/12 systems is negative with the exception of HPI.    Objective:     MEDICATIONS:  Allergies   Allergen Reactions   ??? Cephalosporins Other (See Comments)     Liver/Kidney failure  Liver/Kidney failure  Pt reports kidney damage from zyvox   ??? Linezolid Nausea Only and Other (See Comments)     Liver/Kidney failure.   Other reaction(s): Other (See Comments)  Liver/Kidney failure.        Current Outpatient Prescriptions   Medication Sig Dispense Refill   ??? colchicine 0.6 mg tablet Take 0.6 mg by mouth daily as needed.      ??? multivitamin (TAB-A-VITE/THERAGRAN) per tablet Take 1 tablet by mouth daily.     ??? MYFORTIC 180 mg EC tablet Take 1 tablet (180 mg total) by mouth Two (2) times a day. 180 tablet 3   ??? PROGRAF 0.5 mg capsule Take one 1mg  with one 0.5mg  cap in the morning (1.5mg  total) and one 1mg  cap in the evening (1mg  total). 90 capsule 3   ??? PROGRAF 1 mg capsule Take one 1mg  with one 0.5mg  cap in the morning (1.5mg  total) and one 1mg  cap in the evening (1mg  total). 180 capsule 3   ??? allopurinol (ZYLOPRIM) 100 MG tablet Take 100 mg by mouth daily.     ??? fluocinonide (LIDEX) 0.05 % ointment Use twice daily to lower legs as needed. 60 g 2     No current facility-administered medications for this visit.          PHYSICAL EXAM:  BP 184/98 (BP Site: L Arm, BP Position: Sitting, BP Cuff Size: Medium)  - Pulse 66  - Temp 36.9 ??  C (98.4 ??F) (Tympanic)  - Ht 182.9 cm (6')  - Wt (!) 143 kg (315 lb 4.8 oz)  - BMI 42.76 kg/m??     General Appearance:  NAD, well appearing and well nourished.   HEENT:  [place]/AT. Well hydrated moist mucous membranes of the oral cavity. No scleral icterus. No cervical lymphadenopathy.   Pulmonary:    Normal respiratory effort. CTAB, without wheezes/crackles/rhonchi. Good air movement.    Cardiovascular:  Regular rate and rhythm, no murmur noted.   Extremities No edema.    Abdomen:   Normoactive bowel sounds, abdomen soft, non-tender and not distended, no Hepatosplenomegaly or masses. Abdominal scar well healed with hernia.    Musculoskeletal: No joint tenderness, full ROM. Normal gait.    Skin: Skin color, texture, turgor normal, no rashes or lesions.   Neurologic: Grossly intact.   Psychiatric: Judgement and insight appropriate.        LAB RESULTS:  All lab results last 24 hours:    Recent Results (from the past 48 hour(s))   Comprehensive Metabolic Panel    Collection Time: 08/20/17  8:12 AM   Result Value Ref Range    Sodium 140 135 - 145 mmol/L    Potassium 4.4 3.5 - 5.0 mmol/L    Chloride 105 98 - 107 mmol/L    CO2 28.0 22.0 - 30.0 mmol/L    BUN 17 7 - 21 mg/dL    Creatinine 5.40 9.81 - 1.30 mg/dL    BUN/Creatinine Ratio 14     EGFR MDRD Non Af Amer 59 (L) >=60 mL/min/1.49m2    EGFR MDRD Af Amer 71 >=60 mL/min/1.65m2    Anion Gap 7 (L) 9 - 15 mmol/L    Glucose 115 65 - 179 mg/dL    Calcium 8.7 8.5 - 19.1 mg/dL    Albumin 3.7 3.5 - 5.0 g/dL    Total Protein 6.5 6.5 - 8.3 g/dL    Total Bilirubin 0.8 0.0 - 1.2 mg/dL    AST 21 19 - 55 U/L    ALT 25 19 - 72 U/L    Alkaline Phosphatase 111 38 - 126 U/L   Bilirubin, Direct    Collection Time: 08/20/17  8:12 AM   Result Value Ref Range    Bilirubin, Direct 0.40 0.00 - 0.40 mg/dL   Phosphorus Level    Collection Time: 08/20/17  8:12 AM Result Value Ref Range    Phosphorus 3.1 2.9 - 4.7 mg/dL   Magnesium Level    Collection Time: 08/20/17  8:12 AM   Result Value Ref Range    Magnesium 1.7 1.6 - 2.2 mg/dL   Gamma GT    Collection Time: 08/20/17  8:12 AM   Result Value Ref Range    GGT 26 12 - 109 U/L   AFP tumor marker    Collection Time: 08/20/17  8:12 AM   Result Value Ref Range    AFP-Tumor Marker 2.35 <7.51 ng/mL   CBC w/ Differential    Collection Time: 08/20/17  8:12 AM   Result Value Ref Range    WBC 3.6 (L) 4.5 - 11.0 10*9/L    RBC 5.23 4.50 - 5.90 10*12/L    HGB 15.5 13.5 - 17.5 g/dL    HCT 47.8 29.5 - 62.1 %    MCV 86.8 80.0 - 100.0 fL    MCH 29.7 26.0 - 34.0 pg    MCHC 34.2 31.0 - 37.0 g/dL    RDW 30.8 65.7 - 84.6 %  MPV 8.6 7.0 - 10.0 fL    Platelet 126 (L) 150 - 440 10*9/L    Neutrophils % 49.3 %    Lymphocytes % 36.9 %    Monocytes % 8.0 %    Eosinophils % 2.9 %    Basophils % 1.0 %    Absolute Neutrophils 1.8 (L) 2.0 - 7.5 10*9/L    Absolute Lymphocytes 1.3 (L) 1.5 - 5.0 10*9/L    Absolute Monocytes 0.3 0.2 - 0.8 10*9/L    Absolute Eosinophils 0.1 0.0 - 0.4 10*9/L    Absolute Basophils 0.0 0.0 - 0.1 10*9/L    Large Unstained Cells 2 0 - 4 %     IMAGING:  Mri Abdomen W Wo Contrast  Result Date: 08/20/2017  - Sequelae of liver transplant without evidence of HCC.   - Unchanged indeterminate enhancing lesion in the interpolar native right kidney. Attention is suggested on follow-up studies.   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     Xr Chest 2 Views  Result Date: 08/20/2017  Radiographically clear lungs. No pleural effusion or pneumothorax. Unremarkable cardiomediastinal silhouette.     US Renal Transplant W Native Kidneys  Result Date: 08/20/2017  -Small, bilateral echogenic native kidneys, consistent with chronic medical renal disease, poorly visualized.   -Slight increase in resistive index in the main renal artery. Remaining arterial indices are within normal limits.  -Subcentimeter hypoechoic lesion in the inferior transplant, too small to characterize, likely a cyst   -Subcentimeter exophytic hypoechoic lesion in the right kidney, too small to characterize.    _______________________________________________        Gertie Fey, DNP, APRN, FNP-C  [facility] [place]  [address]  [place], [place]  [address]

## 2017-08-20 NOTE — Unmapped (Signed)
Pt ID verified with Name and Date of birth. All screening questions answered.   Vaccine(s) administered as ordered. See Immunization history for documentation. Pt tolerated the injection(s) well with no issues noted. Vaccine Information Sheet given to patient.

## 2017-08-20 NOTE — Unmapped (Signed)
Patient's annual imaging including: renal ultrasound, abdominal MRI and chest Xray reviewed by Gertie Fey, NP via inbasket with no recommendation for follow-up other than routine annual imaging per protocol.

## 2017-08-21 NOTE — Unmapped (Addendum)
Call placed to patient to follow-up post annual visit yesterday as coordinator was unable to see patient during clinic visit.  Patient is seen locally for primary care by Dr. Georga Kaufmann.  He obtains routine labs @ local [facility] or [facility] (updated standing lab orders entered) - previously approved by provider to have routine labs every 2-3 months.  He fills his BRAND prograf and myfortic via [facility] pharmacy.  He denies N/V/D/F alcohol tobacco oro drug use.  In the past year he has been diagnosed with gout which is managed by his local PCP.  Discussed BP during clinic visit significantly elevated he confirms it is much lower @ home - reinforced importance of good BP control locally and to f/u with PCP if elevated - he verbalized agreement.  Reinforced importance of active lifestyle, exercize and healthy eating to promote weight loss.  Patient continues to enjoy encouraging other post transplant patient through relationships he has gained post transplant - provided positive reinforcement for this relationship to encourage others.  Last colonoscopy in 2017 for which patient reports he was told to repeat in 10 years (despite polyp being noted on report per patient).

## 2017-09-05 NOTE — Unmapped (Signed)
[facility] Specialty Pharmacy Refill Coordination Note  Specialty Medication(s): PROGRAF 1MG  CAP  MYFORTIC 180MG  TAB  PROGRAF .5MG  CAP      Trevor Weber, DOB: 1946-02-04  Phone: [phone] (home) , Alternate phone contact: N/A  Phone or address changes today?: No  All above HIPAA information was verified with patient.  Shipping Address: [address]  [place] [address]   Insurance changes? No    Completed refill call assessment today to schedule patient's medication shipment from the [facility] [phone]).      Confirmed the medication and dosage are correct and have not changed: Yes, regimen is correct and unchanged.    Confirmed patient started or stopped the following medications in the past month:  No, there are no changes reported at this time.    Are you tolerating your medication?:  Trevor Weber reports tolerating the medication.    ADHERENCE    Tacrolimus 0.5 mg   Quantity filled last month: 30   REMAINING: 7 DAYS    Tacrolimus 1 mg   Quantity filled last month: 60   REMAINING: 7 DAYS    Myfortic 180 mg   Quantity filled last month: 60   REMAINING: 7 DAYS        Did you miss any doses in the past 4 weeks? No missed doses reported.    FINANCIAL/SHIPPING    Delivery Scheduled: Yes, Expected medication delivery date: 09/10/17     The patient will receive an FSI print out for each medication shipped and additional FDA Medication Guides as required.  Patient education from [place] or [name] may also be included in the shipment    Trevor Weber did not have any additional questions at this time.    Delivery address validated in FSI scheduling system: Yes, address listed in FSI is correct.    We will follow up with patient monthly for standard refill processing and delivery.      Thank you,  Westley Gambles   [facility] Shared [facility] Specialty Technician

## 2017-09-16 NOTE — Unmapped (Signed)
Medications were not scheduled in FSI-  Patient called 3/18 to see what the delay was  Sending out prograf 1, 0.5 and myfortic 180 on 3/20 for patient  Trevor Weber

## 2017-09-17 MED FILL — PROGRAF/1MG/CAP: PROGRAF/1MG/CAP | 30 days supply | Qty: 60 | Fill #1

## 2017-09-17 MED FILL — MYFORTIC/180MG/TAB: MYFORTIC/180MG/TAB | 30 days supply | Qty: 60 | Fill #9

## 2017-09-17 MED FILL — PROGRAF/0.5MG/CAP: PROGRAF/0.5MG/CAP | 30 days supply | Qty: 30 | Fill #9

## 2017-09-26 MED ORDER — ALLOPURINOL 100 MG TABLET
ORAL_TABLET | Freq: Every day | ORAL | 11 refills | 0.00000 days | Status: CP
Start: 2017-09-26 — End: 2018-03-19

## 2017-09-26 MED ORDER — COLCHICINE 0.6 MG TABLET
ORAL_TABLET | 2 refills | 0 days | Status: CP
Start: 2017-09-26 — End: 2018-08-21

## 2017-09-26 NOTE — Unmapped (Signed)
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  Per Dr. Margaretmary Bayley, will order colchicine taper and then allopurinol when flare stops.

## 2017-10-02 NOTE — Unmapped (Signed)
error 

## 2017-10-08 LAB — ALBUMIN: Lab: 4

## 2017-10-08 LAB — CBC W/ DIFFERENTIAL
BANDED NEUTROPHILS ABSOLUTE COUNT: 0 10*3/uL (ref 0.0–0.1)
BASOPHILS ABSOLUTE COUNT: 0 10*3/uL (ref 0.0–0.2)
BASOPHILS RELATIVE PERCENT: 1 %
EOSINOPHILS ABSOLUTE COUNT: 0.1 10*3/uL (ref 0.0–0.4)
EOSINOPHILS RELATIVE PERCENT: 4 %
HEMATOCRIT: 46.9 % (ref 37.5–51.0)
HEMOGLOBIN: 15.9 g/dL (ref 13.0–17.7)
LYMPHOCYTES RELATIVE PERCENT: 37 %
MEAN CORPUSCULAR HEMOGLOBIN CONC: 33.9 g/dL (ref 31.5–35.7)
MEAN CORPUSCULAR HEMOGLOBIN: 30.5 pg (ref 26.6–33.0)
MEAN CORPUSCULAR VOLUME: 90 fL (ref 79–97)
MONOCYTES ABSOLUTE COUNT: 0.3 10*3/uL (ref 0.1–0.9)
MONOCYTES RELATIVE PERCENT: 9 %
NEUTROPHILS ABSOLUTE COUNT: 1.9 10*3/uL (ref 1.4–7.0)
NEUTROPHILS RELATIVE PERCENT: 48 %
PLATELET COUNT: 122 10*3/uL — ABNORMAL LOW (ref 150–379)
RED BLOOD CELL COUNT: 5.21 x10E6/uL (ref 4.14–5.80)
RED CELL DISTRIBUTION WIDTH: 14.9 % (ref 12.3–15.4)
WHITE BLOOD CELL COUNT: 3.7 10*3/uL (ref 3.4–10.8)

## 2017-10-08 LAB — COMPREHENSIVE METABOLIC PANEL
A/G RATIO: 1.7 (ref 1.2–2.2)
ALKALINE PHOSPHATASE: 104 IU/L (ref 39–117)
AST (SGOT): 19 IU/L (ref 0–40)
BILIRUBIN TOTAL: 0.5 mg/dL (ref 0.0–1.2)
BUN / CREAT RATIO: 12 (ref 10–24)
CHLORIDE: 106 mmol/L (ref 96–106)
CO2: 19 mmol/L — ABNORMAL LOW (ref 20–29)
CREATININE: 1.22 mg/dL (ref 0.76–1.27)
GLOBULIN, TOTAL: 2.4 g/dL (ref 1.5–4.5)
GLUCOSE: 117 mg/dL — ABNORMAL HIGH (ref 65–99)
POTASSIUM: 4.2 mmol/L (ref 3.5–5.2)
SODIUM: 139 mmol/L (ref 134–144)
TOTAL PROTEIN: 6.4 g/dL (ref 6.0–8.5)

## 2017-10-08 LAB — MAGNESIUM
Lab: 1.8
MAGNESIUM: 1.8 mg/dL (ref 1.6–2.3)

## 2017-10-08 LAB — NEUTROPHILS RELATIVE PERCENT: Lab: 48

## 2017-10-08 LAB — BILIRUBIN DIRECT: Lab: 0.14

## 2017-10-08 LAB — GAMMA GLUTAMYL TRANSFERASE: Lab: 18

## 2017-10-08 LAB — PHOSPHORUS, SERUM: Lab: 2.8

## 2017-10-09 LAB — TACROLIMUS BLOOD: Lab: 4.5

## 2017-10-09 NOTE — Unmapped (Signed)
[facility] Specialty Pharmacy Refill and Clinical Coordination Note  Medication(s): PROGRAF 1, PROGRAF 0.5, MYFORTIC 84 Trevor Weber, [place]: 05/13/1946  Phone: [phone] (home) , Alternate phone contact: N/A  Shipping address: [address]  [place] [address]  Phone or address changes today?: No  All above HIPAA information verified.  Insurance changes? No    Completed refill and clinical call assessment today to schedule patient's medication shipment from the [facility] [phone]).      MEDICATION RECONCILIATION    Confirmed the medication and dosage are correct and have not changed: Yes, regimen is correct and unchanged.    Were there any changes to your medication(s) in the past month:  Yes. 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    Prograf 1 mg   Quantity filled last month: 60   # of tablets left on hand: 12 DAYS LEFT      Prograf 0.5 mg   Quantity filled last month: 30   # of tablets left on hand: 12 DAYS LEFT      Myfortic 180 mg   Quantity filled last month: 60   # of tablets left on hand: 12 DAYS LEFT      Did you miss any doses in the past 4 weeks? No missed doses reported.  Adherence counseling provided? Not needed     SIDE EFFECT MANAGEMENT    Are you tolerating your medication?:  Trevor Weber reports tolerating the medication.  Side effect management discussed: None      Therapy is appropriate and should be continued.    Evidence of clinical benefit: See Epic note from 08/20/17      FINANCIAL/SHIPPING    Delivery Scheduled: Yes, Expected medication delivery date: 10/17/17   Additional medications refilled: No additional medications/refills needed at this time.    The patient will receive an FSI print out for each medication shipped and additional FDA Medication Guides as required.  Patient education from [place] or Trevor Weber may also be included in the shipment.    Trevor Weber did not have any additional questions at this time.    Delivery address validated in FSI scheduling system: Yes, address listed above is correct.      We will follow up with patient monthly for standard refill processing and delivery.      Thank you,  Trevor Weber   [facility] Shared [name] Specialty Pharmacist

## 2017-10-16 MED FILL — PROGRAF/1MG/CAP: PROGRAF/1MG/CAP | 30 days supply | Qty: 60 | Fill #2

## 2017-10-16 MED FILL — MYFORTIC/180MG/TAB: MYFORTIC/180MG/TAB | 30 days supply | Qty: 60 | Fill #10

## 2017-10-16 MED FILL — PROGRAF/0.5MG/CAP: PROGRAF/0.5MG/CAP | 30 days supply | Qty: 30 | Fill #10

## 2017-11-06 NOTE — Unmapped (Signed)
[facility] Specialty Pharmacy Refill Coordination Note    Specialty Medication(s) to be Shipped:   Transplant: Myfortic 180mg , Prograf 1mg  and Prograf 0.5mg      Other medication(s) to be shipped:       [address] [place], DOB: 1946/07/01  Phone: [phone] (home)   Shipping Address: [address]  [place] [address]    All above HIPAA information was verified with patient.     Completed refill call assessment today to schedule patient's medication shipment from the [facility] [phone]).       Specialty medication(s) and dose(s) confirmed: Regimen is correct and unchanged.   Changes to medications: Hardy reports no changes reported at this time.  Changes to insurance: No  Questions for the pharmacist: No    The patient will receive an FSI print out for each medication shipped and additional FDA Medication Guides as required.  Patient education from [place] or [name] may also be included in the shipment.    DISEASE-SPECIFIC INFORMATION        N/A    ADHERENCE              MEDICARE PART B DOCUMENTATION     Prograf 0.5 mg   Quantity filled last month: 30 CAPSULES   # of tablets left on hand: 10 CAPSULES    Prograf 1 mg   Quantity filled last month: 60   # of tablets left on hand: 20 CAPSULES  Myfortic 180 mg   Quantity filled last month: 60 TABLETS   # of tablets left on hand: 20 TABLETS             SHIPPING     Shipping address confirmed in FSI.     Delivery Scheduled: Yes, Expected medication delivery date: [phone] via UPS or courier.     Antonietta Barcelona   [facility] Shared [facility] Specialty Technician

## 2017-11-14 MED FILL — MYFORTIC/180MG/TAB: MYFORTIC/180MG/TAB | 30 days supply | Qty: 60 | Fill #11

## 2017-11-14 MED FILL — PROGRAF/1MG/CAP: PROGRAF/1MG/CAP | 30 days supply | Qty: 60 | Fill #3

## 2017-11-14 MED FILL — PROGRAF/0.5MG/CAP: PROGRAF/0.5MG/CAP | 30 days supply | Qty: 30 | Fill #11

## 2017-12-06 MED ORDER — MYFORTIC 180 MG TABLET,DELAYED RELEASE: 180 mg | tablet | Freq: Two times a day (BID) | 3 refills | 0 days | Status: AC

## 2017-12-06 MED ORDER — MYFORTIC 180 MG TABLET,DELAYED RELEASE: 180 mg | tablet | 10 refills | 0 days

## 2017-12-06 MED ORDER — PROGRAF 0.5 MG CAPSULE
ORAL_CAPSULE | 3 refills | 0 days | Status: CP
Start: 2017-12-06 — End: 2018-02-25

## 2017-12-06 MED ORDER — MYFORTIC 180 MG TABLET,DELAYED RELEASE
ORAL_TABLET | Freq: Two times a day (BID) | ORAL | 3 refills | 0.00000 days | Status: CP
Start: 2017-12-06 — End: 2017-12-06
  Filled 2018-03-04: qty 60, 30d supply, fill #0

## 2017-12-06 MED ORDER — PROGRAF 1 MG CAPSULE: capsule | 11 refills | 0 days

## 2017-12-06 MED ORDER — PROGRAF 1 MG CAPSULE
ORAL_CAPSULE | ORAL | 3 refills | 0.00000 days | Status: CP
Start: 2017-12-06 — End: 2017-12-06

## 2017-12-06 NOTE — Unmapped (Signed)
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     Completed refill call assessment today to schedule patient's medication shipment from the [facility] [phone]).       Specialty medication(s) and dose(s) confirmed: Regimen is correct and unchanged.   Changes to medications: Trevor Weber reports no changes reported at this time.  Changes to insurance: No  Questions for the pharmacist: No    The patient will receive an FSI print out for each medication shipped and additional FDA Medication Guides as required.  Patient education from [place] or [name] may also be included in the shipment.    DISEASE-SPECIFIC INFORMATION        N/A    ADHERENCE     Medication Adherence    Patient reported X missed doses in the last month:  0          MEDICARE PART B DOCUMENTATION     Myfortic 180mg : Patient has 8 days worth of tablets on hand.  Prograf 0.5mg : Patient has 8 days worth of capsules on hand.  Prograf 1mg : Patient has 8 days worth of capsules on hand.    SHIPPING     Shipping address confirmed in FSI.     Delivery Scheduled: Yes, Expected medication delivery date: 12/12/2017 via UPS or courier.     Trevor Weber   [facility] Shared [facility] Specialty Technician

## 2017-12-11 MED FILL — MYFORTIC/180MG/TAB: MYFORTIC/180MG/TAB | 30 days supply | Qty: 60 | Fill #0

## 2017-12-11 MED FILL — PROGRAF/0.5MG/CAP: PROGRAF/0.5MG/CAP | 30 days supply | Qty: 30 | Fill #0

## 2017-12-11 MED FILL — PROGRAF/1MG/CAP: PROGRAF/1MG/CAP | 30 days supply | Qty: 60 | Fill #4

## 2017-12-19 NOTE — Unmapped (Signed)
Patient called to relay interest in coordinating uric acid level with next lab draw.  Kidney coordinator has recently been in communication with patient/nephrologist regarding recommendations from PCP for gout management (refer to telephone encounter).      She had instructed patient he would need his uric acid level checked but he had not reached back out.  Kidney coordinator agreeable to enter uric acid level to be drawn with next set of labs @ [facility].  Patient aware he should request lab tech draw this lab in combination with routine standing post liver/kidney lab orders.  He is aware he can f/u with kidney coordinator for any additional concerns related to nephrology input and gout care.

## 2017-12-21 LAB — CBC W/ DIFFERENTIAL
BANDED NEUTROPHILS ABSOLUTE COUNT: 0 10*3/uL (ref 0.0–0.1)
BASOPHILS ABSOLUTE COUNT: 0 10*3/uL (ref 0.0–0.2)
BASOPHILS RELATIVE PERCENT: 1 %
EOSINOPHILS ABSOLUTE COUNT: 0.1 10*3/uL (ref 0.0–0.4)
EOSINOPHILS RELATIVE PERCENT: 3 %
HEMOGLOBIN: 15.2 g/dL (ref 13.0–17.7)
IMMATURE GRANULOCYTES: 1 %
LYMPHOCYTES ABSOLUTE COUNT: 1.3 10*3/uL (ref 0.7–3.1)
LYMPHOCYTES RELATIVE PERCENT: 34 %
MEAN CORPUSCULAR HEMOGLOBIN: 29.7 pg (ref 26.6–33.0)
MEAN CORPUSCULAR VOLUME: 92 fL (ref 79–97)
MONOCYTES ABSOLUTE COUNT: 0.4 10*3/uL (ref 0.1–0.9)
MONOCYTES RELATIVE PERCENT: 10 %
NEUTROPHILS ABSOLUTE COUNT: 1.9 10*3/uL (ref 1.4–7.0)
NEUTROPHILS RELATIVE PERCENT: 51 %
PLATELET COUNT: 122 10*3/uL — ABNORMAL LOW (ref 150–450)
RED BLOOD CELL COUNT: 5.11 x10E6/uL (ref 4.14–5.80)
RED CELL DISTRIBUTION WIDTH: 15.3 % (ref 12.3–15.4)
WHITE BLOOD CELL COUNT: 3.7 10*3/uL (ref 3.4–10.8)

## 2017-12-21 LAB — COMPREHENSIVE METABOLIC PANEL
A/G RATIO: 1.6 (ref 1.2–2.2)
ALBUMIN: 3.9 g/dL (ref 3.6–4.8)
ALKALINE PHOSPHATASE: 97 IU/L (ref 39–117)
ALT (SGPT): 18 IU/L (ref 0–44)
AST (SGOT): 19 IU/L (ref 0–40)
BLOOD UREA NITROGEN: 17 mg/dL (ref 8–27)
BUN / CREAT RATIO: 15 (ref 10–24)
CALCIUM: 8.8 mg/dL (ref 8.6–10.2)
CHLORIDE: 106 mmol/L (ref 96–106)
CREATININE: 1.17 mg/dL (ref 0.76–1.27)
GLOBULIN, TOTAL: 2.4 g/dL (ref 1.5–4.5)
GLUCOSE: 111 mg/dL — ABNORMAL HIGH (ref 65–99)
POTASSIUM: 4.2 mmol/L (ref 3.5–5.2)
SODIUM: 142 mmol/L (ref 134–144)
TOTAL PROTEIN: 6.3 g/dL (ref 6.0–8.5)

## 2017-12-21 LAB — MAGNESIUM: Lab: 1.8

## 2017-12-21 LAB — URIC ACID: Lab: 7.9

## 2017-12-21 LAB — PHOSPHORUS, SERUM: Lab: 2.5

## 2017-12-21 LAB — GLUCOSE: Lab: 111 — ABNORMAL HIGH

## 2017-12-21 LAB — BILIRUBIN DIRECT: Lab: 0.16

## 2017-12-21 LAB — GAMMA GLUTAMYL TRANSFERASE: Lab: 23

## 2017-12-21 LAB — IMMATURE GRANULOCYTES: Lab: 1

## 2017-12-24 LAB — TACROLIMUS BLOOD: Lab: 7.6

## 2017-12-25 MED ORDER — ALLOPURINOL 100 MG TABLET
ORAL_TABLET | Freq: Every day | ORAL | 11 refills | 0 days | Status: CP
Start: 2017-12-25 — End: 2018-08-21

## 2017-12-25 NOTE — Unmapped (Signed)
Reviewed patient's 6/21 tac level of 7/6 (goal 3-5/4-6) with Gertie Fey, NP who orders no changes at this time other than repeat labs.  Called patient to relay request for repeat labs - he verbalized agreement and will return within the next 2-4 weeks to ensure trough level is in therapeutic range.

## 2017-12-26 NOTE — Unmapped (Signed)
Per Dr. Margaretmary Bayley, labs reviewed will increase allopurinol to 200mg  daily as he still has a gouty nodule on 100mg  tablets.

## 2018-01-09 NOTE — Unmapped (Signed)
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     Completed refill call assessment today to schedule patient's medication shipment from the [facility] [phone]).       Specialty medication(s) and dose(s) confirmed: Regimen is correct and unchanged.   Changes to medications: Ryne reports no changes reported at this time.  Changes to insurance: No  Questions for the pharmacist: No    The patient will receive an FSI print out for each medication shipped and additional FDA Medication Guides as required.  Patient education from [place] or [name] may also be included in the shipment.    DISEASE-SPECIFIC INFORMATION        N/A    ADHERENCE     Medication Adherence    Patient reported X missed doses in the last month:  0         MEDICARE PART B DOCUMENTATION     Myfortic 180mg : Patient has 7 days worth of tablets on hand.  Prograf 0.5mg : Patient has 7 days worth of capsules on hand.  Prograf 1mg : Patient has 7 days worth of capsules on hand.    SHIPPING     Shipping address confirmed in FSI.     Delivery Scheduled: Yes, Expected medication delivery date: 01/14/2018 via UPS or courier.     Esme Freund Leodis Binet   [facility] Shared [facility] Specialty Technician

## 2018-01-13 MED FILL — PROGRAF/1MG/CAP: PROGRAF/1MG/CAP | 30 days supply | Qty: 60 | Fill #5

## 2018-01-13 MED FILL — MYFORTIC/180MG/TAB: MYFORTIC/180MG/TAB | 30 days supply | Qty: 60 | Fill #1

## 2018-01-13 MED FILL — PROGRAF/0.5MG/CAP: PROGRAF/0.5MG/CAP | 30 days supply | Qty: 30 | Fill #1

## 2018-01-30 LAB — CBC W/ DIFFERENTIAL
BANDED NEUTROPHILS ABSOLUTE COUNT: 0 10*3/uL (ref 0.0–0.1)
BASOPHILS ABSOLUTE COUNT: 0 10*3/uL (ref 0.0–0.2)
BASOPHILS RELATIVE PERCENT: 1 %
EOSINOPHILS RELATIVE PERCENT: 3 %
HEMATOCRIT: 47.5 % (ref 37.5–51.0)
HEMOGLOBIN: 16.2 g/dL (ref 13.0–17.7)
LYMPHOCYTES ABSOLUTE COUNT: 1.5 10*3/uL (ref 0.7–3.1)
LYMPHOCYTES RELATIVE PERCENT: 41 %
MEAN CORPUSCULAR HEMOGLOBIN CONC: 34.1 g/dL (ref 31.5–35.7)
MEAN CORPUSCULAR HEMOGLOBIN: 31.1 pg (ref 26.6–33.0)
MEAN CORPUSCULAR VOLUME: 91 fL (ref 79–97)
MONOCYTES ABSOLUTE COUNT: 0.3 10*3/uL (ref 0.1–0.9)
MONOCYTES RELATIVE PERCENT: 8 %
NEUTROPHILS RELATIVE PERCENT: 46 %
RED BLOOD CELL COUNT: 5.21 x10E6/uL (ref 4.14–5.80)
RED CELL DISTRIBUTION WIDTH: 15.2 % (ref 12.3–15.4)
WHITE BLOOD CELL COUNT: 3.6 10*3/uL (ref 3.4–10.8)

## 2018-01-30 LAB — COMPREHENSIVE METABOLIC PANEL
A/G RATIO: 1.6 (ref 1.2–2.2)
ALBUMIN: 3.9 g/dL (ref 3.6–4.8)
ALKALINE PHOSPHATASE: 104 IU/L (ref 39–117)
ALT (SGPT): 20 IU/L (ref 0–44)
AST (SGOT): 17 IU/L (ref 0–40)
BILIRUBIN TOTAL: 0.6 mg/dL (ref 0.0–1.2)
BUN / CREAT RATIO: 14 (ref 10–24)
CALCIUM: 8.7 mg/dL (ref 8.6–10.2)
CHLORIDE: 103 mmol/L (ref 96–106)
CO2: 23 mmol/L (ref 20–29)
CREATININE: 1.29 mg/dL — ABNORMAL HIGH (ref 0.76–1.27)
GLOBULIN, TOTAL: 2.5 g/dL (ref 1.5–4.5)
POTASSIUM: 4.3 mmol/L (ref 3.5–5.2)
SODIUM: 141 mmol/L (ref 134–144)

## 2018-01-30 LAB — GAMMA GLUTAMYL TRANSFERASE: Lab: 22

## 2018-01-30 LAB — PHOSPHORUS: PHOSPHORUS, SERUM: 2.8 mg/dL (ref 2.5–4.5)

## 2018-01-30 LAB — PHOSPHORUS, SERUM: Lab: 2.8

## 2018-01-30 LAB — MAGNESIUM: Lab: 1.7

## 2018-01-30 LAB — AST (SGOT): Lab: 17

## 2018-01-30 LAB — TACROLIMUS BLOOD: Lab: 5.2

## 2018-01-30 LAB — TACROLIMUS LEVEL: TACROLIMUS BLOOD: 5.2 ng/mL (ref 2.0–20.0)

## 2018-01-30 LAB — IMMATURE GRANULOCYTES: Lab: 1

## 2018-01-30 LAB — BILIRUBIN DIRECT: Lab: 0.16

## 2018-01-31 NOTE — Unmapped (Signed)
Current Outpatient Medications on File Prior to Visit   Medication Sig   ??? allopurinol (ZYLOPRIM) 100 MG tablet Take 1 tablet (100 mg total) by mouth daily. Start after flare gone   ??? allopurinol (ZYLOPRIM) 100 MG tablet Take 2 tablets (200 mg total) by mouth daily.   ??? colchicine (COLCRYS) 0.6 mg tablet 1tab tid x 2 days, 1tab bid x 2 days, 1tab QD x 3 days, then if symptoms gone start allopurinol   ??? multivitamin (TAB-A-VITE/THERAGRAN) per tablet Take 1 tablet by mouth daily.   ??? MYFORTIC 180 mg EC tablet Take 1 tablet (180 mg total) by mouth Two (2) times a day.   ??? PROGRAF 0.5 mg capsule Take one 1mg  with one 0.5mg  cap in the morning (1.5mg  total) and one 1mg  cap in the evening (1mg  total).   ??? PROGRAF 1 mg capsule Take one 1mg  with one 0.5mg  cap in the morning (1.5mg  total) and one 1mg  cap in the evening (1mg  total).     No current facility-administered medications on file prior to visit.

## 2018-02-03 NOTE — Unmapped (Signed)
Current Outpatient Medications on File Prior to Visit   Medication Sig   ??? allopurinol (ZYLOPRIM) 100 MG tablet Take 1 tablet (100 mg total) by mouth daily. Start after flare gone   ??? allopurinol (ZYLOPRIM) 100 MG tablet Take 2 tablets (200 mg total) by mouth daily.   ??? colchicine (COLCRYS) 0.6 mg tablet 1tab tid x 2 days, 1tab bid x 2 days, 1tab QD x 3 days, then if symptoms gone start allopurinol   ??? multivitamin (TAB-A-VITE/THERAGRAN) per tablet Take 1 tablet by mouth daily.   ??? MYFORTIC 180 mg EC tablet Take 1 tablet (180 mg total) by mouth Two (2) times a day.   ??? PROGRAF 0.5 mg capsule Take one 1mg  with one 0.5mg  cap in the morning (1.5mg  total) and one 1mg  cap in the evening (1mg  total).   ??? PROGRAF 1 mg capsule Take one 1mg  with one 0.5mg  cap in the morning (1.5mg  total) and one 1mg  cap in the evening (1mg  total).     No current facility-administered medications on file prior to visit.

## 2018-02-04 NOTE — Unmapped (Signed)
[facility] Specialty Pharmacy Refill Coordination Note  Specialty Medication(s): Prograf 0.5mg  and 1mg  capsules, Myfortic 180mg  tablets  Additional Medications shipped: none    [address] [place], DOB: April 27, 1946  Phone: [phone] (home) , Alternate phone contact: N/A  Phone or address changes today?: No  All above HIPAA information was verified with patient.  Shipping Address: [address]  [place] [address]   Insurance changes? No    Completed refill call assessment today to schedule patient's medication shipment from the [facility] [phone]).      Confirmed the medication and dosage are correct and have not changed: Yes, regimen is correct and unchanged.    Confirmed patient started or stopped the following medications in the past month:  No, there are no changes reported at this time.    Are you tolerating your medication?:  Arlo reports tolerating the medication.    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    Prograf 0.5 mg   Quantity filled last month: 30   # of tablets left on hand: 10  Prograf 1 mg   Quantity filled last month: 60   # of tablets left on hand: 20  Myfortic 180 mg   Quantity filled last month: 60   # of tablets left on hand: 20        Did you miss any doses in the past 4 weeks? No missed doses reported.    FINANCIAL/SHIPPING    Delivery Scheduled: Yes, Expected medication delivery date: 02/07/09     The patient will receive an FSI print out for each medication shipped and additional FDA Medication Guides as required.  Patient education from [place] or [name] may also be included in the shipment.    Dawan did not have any additional questions at this time.    Delivery address validated in FSI scheduling system: Yes, address listed in FSI is correct.    We will follow up with patient monthly for standard refill processing and delivery.      Thank you,  Mervyn Gay   [facility] Specialty Pharmacist

## 2018-02-05 NOTE — Unmapped (Signed)
Epic [facility]) medication reconciliation is completed.

## 2018-02-06 MED FILL — PROGRAF/1MG/CAP: PROGRAF/1MG/CAP | 30 days supply | Qty: 60 | Fill #6

## 2018-02-06 MED FILL — MYFORTIC/180MG/TAB: MYFORTIC/180MG/TAB | 30 days supply | Qty: 60 | Fill #2

## 2018-02-06 MED FILL — PROGRAF/0.5MG/CAP: PROGRAF/0.5MG/CAP | 30 days supply | Qty: 30 | Fill #2

## 2018-02-25 MED ORDER — PROGRAF 1 MG CAPSULE
ORAL_CAPSULE | 11 refills | 0 days | Status: CP
Start: 2018-02-25 — End: 2018-04-29
  Filled 2018-03-04: qty 30, 30d supply, fill #0

## 2018-02-25 MED ORDER — PROGRAF 0.5 MG CAPSULE
ORAL_CAPSULE | 3 refills | 0 days | Status: CP
Start: 2018-02-25 — End: 2018-04-29

## 2018-02-25 NOTE — Unmapped (Signed)
[facility] Specialty Pharmacy Refill and Clinical Coordination Note  Medication(s): prograf 1 and 0.5 and myfortic    [address] [place], DOB: September 11, 1945  Phone: [phone] (home) , Alternate phone contact: N/A  Shipping address: [address] [place] [address]   Phone or address changes today?: No  All above HIPAA information verified.  Insurance changes? No    Completed refill and clinical call assessment today to schedule patient's medication shipment from the [facility] [phone]).      MEDICATION RECONCILIATION    Confirmed the medication and dosage are correct and have not changed: Yes, regimen is correct and unchanged.    Were there any changes to your medication(s) in the past month:  No, there are no changes reported at this time.    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    Prograf 1 mg   Quantity filled last month: 60   # of tablets left on hand: 12 days left      Prograf 0.5 mg   Quantity filled last month: 30   # of tablets left on hand: 12 days left      Myfortic 180 mg   Quantity filled last month: 60   # of tablets left on hand: 12 days left      Did you miss any doses in the past 4 weeks? No missed doses reported.  Adherence counseling provided? Not needed     SIDE EFFECT MANAGEMENT    Are you tolerating your medication?:  Demosthenes reports tolerating the medication.  Side effect management discussed: None      Therapy is appropriate and should be continued.    Evidence of clinical benefit: See Epic note from 08/20/17      FINANCIAL/SHIPPING    Delivery Scheduled: Yes, Expected medication delivery date: 03/05/18 via ups     Additional medications refilled: No additional medications/refills needed at this time.    The patient will receive a drug information handout for each medication shipped and additional FDA Medication Guides as required.      Jimmie did not have any additional questions at this time.    Delivery address confirmed in Epic.     We will follow up with patient monthly for standard refill processing and delivery.      Thank you,  Thad Ranger   [facility] Shared [facility] Specialty Pharmacist

## 2018-03-04 MED FILL — MYFORTIC 180 MG TABLET,DELAYED RELEASE: 30 days supply | Qty: 60 | Fill #0 | Status: AC

## 2018-03-04 MED FILL — PROGRAF 0.5 MG CAPSULE: 30 days supply | Qty: 30 | Fill #0 | Status: AC

## 2018-03-04 MED FILL — PROGRAF 1 MG CAPSULE: 30 days supply | Qty: 60 | Fill #0

## 2018-03-04 MED FILL — PROGRAF 1 MG CAPSULE: 30 days supply | Qty: 60 | Fill #0 | Status: AC

## 2018-03-19 NOTE — Unmapped (Signed)
Spoke with patient by phone who reports he is doing well and plans to attending the upcoming Liver Transplant Reunion.  He notes previous PCP Delane Ginger has moved - but plans to establish with PCP Burns in October.  He reports gout is managed with allopurinol 300mg  qd - updated med list to reflect this and reports uric acid level improved by local PCP check with no recent gout flares.

## 2018-03-27 NOTE — Unmapped (Signed)
[facility] Specialty Pharmacy Refill Coordination Note    Specialty Medication(s) to be Shipped:   Transplant: Myfortic 180mg , Prograf 0.5mg  and Prograf 1mg      Trevor Weber, DOB: 26-Apr-1946  Phone: [phone] (home)   Shipping Address: [address]  [place] [address]    All above HIPAA information was verified with patient.     Completed refill call assessment today to schedule patient's medication shipment from the [facility] [phone]).       Specialty medication(s) and dose(s) confirmed: Regimen is correct and unchanged.   Changes to medications: Ervan reports no changes reported at this time.  Changes to insurance: No  Questions for the pharmacist: No    The patient will receive a drug information handout for each medication shipped and additional FDA Medication Guides as required.      DISEASE/MEDICATION-SPECIFIC INFORMATION        N/A    ADHERENCE     Medication Adherence    Patient reported X missed doses in the last month:  0         MEDICARE PART B DOCUMENTATION     Myfortic 180mg : Patient has 10 days worth of tablets on hand.  Prograf 0.5mg : Patient has 10 days worth of capsules on hand.  Prograf 1mg : Patient has 10 days worth of capsules on hand.    SHIPPING     Shipping address confirmed in Epic.     Delivery Scheduled: Yes, Expected medication delivery date: 04/04/2018 via UPS or courier.     Trevor Weber   [name] Shared [facility] Specialty Technician

## 2018-04-03 MED FILL — MYFORTIC 180 MG TABLET,DELAYED RELEASE: ORAL | 30 days supply | Qty: 60 | Fill #1

## 2018-04-03 MED FILL — PROGRAF 0.5 MG CAPSULE: 30 days supply | Qty: 30 | Fill #1

## 2018-04-03 MED FILL — PROGRAF 0.5 MG CAPSULE: 30 days supply | Qty: 30 | Fill #1 | Status: AC

## 2018-04-03 MED FILL — PROGRAF 1 MG CAPSULE: 30 days supply | Qty: 60 | Fill #1 | Status: AC

## 2018-04-03 MED FILL — MYFORTIC 180 MG TABLET,DELAYED RELEASE: 30 days supply | Qty: 60 | Fill #1 | Status: AC

## 2018-04-03 MED FILL — PROGRAF 1 MG CAPSULE: 30 days supply | Qty: 60 | Fill #1

## 2018-04-21 NOTE — Unmapped (Signed)
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  Advised he can discuss this @ annual visits with providers.

## 2018-04-23 LAB — CBC W/ DIFFERENTIAL
BANDED NEUTROPHILS ABSOLUTE COUNT: 0 10*3/uL (ref 0.0–0.1)
BASOPHILS RELATIVE PERCENT: 1 %
EOSINOPHILS ABSOLUTE COUNT: 0.1 10*3/uL (ref 0.0–0.4)
EOSINOPHILS RELATIVE PERCENT: 2 %
HEMATOCRIT: 47.1 % (ref 37.5–51.0)
HEMOGLOBIN: 16.1 g/dL (ref 13.0–17.7)
IMMATURE GRANULOCYTES: 1 %
LYMPHOCYTES ABSOLUTE COUNT: 1.4 10*3/uL (ref 0.7–3.1)
MEAN CORPUSCULAR HEMOGLOBIN CONC: 34.2 g/dL (ref 31.5–35.7)
MEAN CORPUSCULAR HEMOGLOBIN: 30.5 pg (ref 26.6–33.0)
MEAN CORPUSCULAR VOLUME: 89 fL (ref 79–97)
MONOCYTES ABSOLUTE COUNT: 0.3 10*3/uL (ref 0.1–0.9)
NEUTROPHILS ABSOLUTE COUNT: 2.2 10*3/uL (ref 1.4–7.0)
NEUTROPHILS RELATIVE PERCENT: 54 %
PLATELET COUNT: 132 10*3/uL — ABNORMAL LOW (ref 150–450)
RED BLOOD CELL COUNT: 5.28 x10E6/uL (ref 4.14–5.80)
RED CELL DISTRIBUTION WIDTH: 14 % (ref 12.3–15.4)
WHITE BLOOD CELL COUNT: 4.2 10*3/uL (ref 3.4–10.8)

## 2018-04-23 LAB — BILIRUBIN DIRECT: Lab: 0.2

## 2018-04-23 LAB — MAGNESIUM: Lab: 1.7

## 2018-04-23 LAB — COMPREHENSIVE METABOLIC PANEL
A/G RATIO: 1.7 (ref 1.2–2.2)
ALKALINE PHOSPHATASE: 117 IU/L (ref 39–117)
ALT (SGPT): 18 IU/L (ref 0–44)
AST (SGOT): 14 IU/L (ref 0–40)
BLOOD UREA NITROGEN: 13 mg/dL (ref 8–27)
BUN / CREAT RATIO: 11 (ref 10–24)
CALCIUM: 8.6 mg/dL (ref 8.6–10.2)
CHLORIDE: 104 mmol/L (ref 96–106)
CO2: 20 mmol/L (ref 20–29)
CREATININE: 1.23 mg/dL (ref 0.76–1.27)
GFR MDRD AF AMER: 68 mL/min/{1.73_m2}
GFR MDRD NON AF AMER: 59 mL/min/{1.73_m2} — ABNORMAL LOW
GLOBULIN, TOTAL: 2.3 g/dL (ref 1.5–4.5)
GLUCOSE: 124 mg/dL — ABNORMAL HIGH (ref 65–99)
POTASSIUM: 4 mmol/L (ref 3.5–5.2)
TOTAL PROTEIN: 6.3 g/dL (ref 6.0–8.5)

## 2018-04-23 LAB — CREATININE: Lab: 1.23

## 2018-04-23 LAB — GAMMA GLUTAMYL TRANSFERASE: Lab: 21

## 2018-04-23 LAB — PHOSPHORUS, SERUM: Lab: 2.5

## 2018-04-23 LAB — RED BLOOD CELL COUNT: Lab: 5.28

## 2018-04-24 LAB — TACROLIMUS BLOOD: Lab: 6.9

## 2018-04-29 MED ORDER — PROGRAF 1 MG CAPSULE
ORAL_CAPSULE | Freq: Two times a day (BID) | ORAL | 11 refills | 0 days | Status: CP
Start: 2018-04-29 — End: 2018-12-16
  Filled 2018-05-16: qty 60, 30d supply, fill #0

## 2018-04-29 NOTE — Unmapped (Addendum)
Reviewed patient's 10/22 tac level of 6.9 (goal 4-6) with Gertie Fey, NP who orders patient to reduce dosing from 1.5/1 to 1mg  BID.  Call placed to patient to relay dose decrease - he verbalized understanding.

## 2018-05-12 NOTE — Unmapped (Signed)
[facility] Specialty Pharmacy Refill and Clinical Coordination Note  Medication(s): prograf 1mg , myfortic 180mg     Trevor Weber, DOB: Mar 14, 1946  Phone: [phone] (home) , Alternate phone contact: N/A  Shipping address:[address] [place]  [address] - this is saved as rx address in wam  Phone or address changes today?: No  All above HIPAA information verified.  Insurance changes? No    Completed refill and clinical call assessment today to schedule patient's medication shipment from the [facility] [phone]).      MEDICATION RECONCILIATION    Confirmed the medication and dosage are correct and have not changed: No, patient reports changes to the regimen as follows: prograf is now 1mg  1 capsule twice daily - pt aware and new rx aware    Were there any changes to your medication(s) in the past month:  No, there are no changes reported at this time.    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    Myfortic 180mg : Patient has 12 days worth of tablets on hand.  Prograf 1mg : Patient has 12 days worth of capsules on hand.    Did you miss any doses in the past 4 weeks? No missed doses reported.  Adherence counseling provided? Not needed     SIDE EFFECT MANAGEMENT    Are you tolerating your medication?:  Hubert reports tolerating the medication.  Side effect management discussed: None      Therapy is appropriate and should be continued.    Evidence of clinical benefit: See Epic note from 08/20/17      FINANCIAL/SHIPPING    Delivery Scheduled: Yes, Expected medication delivery date: 05/19/18     Medication will be delivered via UPS to the prescription address in [facility].    Additional medications refilled: No additional medications/refills needed at this time.    The patient will receive a drug information handout for each medication shipped and additional FDA Medication Guides as required.      Rickardo did not have any additional questions at this time. Delivery address confirmed in Epic.     We will follow up with patient monthly for standard refill processing and delivery.      Thank you,  Thad Ranger   [facility] Shared [facility] Specialty Pharmacist

## 2018-05-16 MED FILL — PROGRAF 1 MG CAPSULE: 30 days supply | Qty: 60 | Fill #0 | Status: AC

## 2018-05-16 MED FILL — MYFORTIC 180 MG TABLET,DELAYED RELEASE: 30 days supply | Qty: 60 | Fill #2 | Status: AC

## 2018-05-16 MED FILL — MYFORTIC 180 MG TABLET,DELAYED RELEASE: ORAL | 30 days supply | Qty: 60 | Fill #2

## 2018-05-20 LAB — COMPREHENSIVE METABOLIC PANEL
A/G RATIO: 1.5 (ref 1.2–2.2)
ALBUMIN: 3.8 g/dL (ref 3.5–4.8)
ALKALINE PHOSPHATASE: 117 IU/L (ref 39–117)
ALT (SGPT): 21 IU/L (ref 0–44)
BLOOD UREA NITROGEN: 15 mg/dL (ref 8–27)
BUN / CREAT RATIO: 12 (ref 10–24)
CALCIUM: 9 mg/dL (ref 8.6–10.2)
CHLORIDE: 102 mmol/L (ref 96–106)
CO2: 22 mmol/L (ref 20–29)
GFR MDRD AF AMER: 66 mL/min/{1.73_m2}
GFR MDRD NON AF AMER: 57 mL/min/{1.73_m2} — ABNORMAL LOW
GLOBULIN, TOTAL: 2.6 g/dL (ref 1.5–4.5)
GLUCOSE: 122 mg/dL — ABNORMAL HIGH (ref 65–99)
POTASSIUM: 4.1 mmol/L (ref 3.5–5.2)
TOTAL PROTEIN: 6.4 g/dL (ref 6.0–8.5)

## 2018-05-20 LAB — CBC W/ DIFFERENTIAL
BANDED NEUTROPHILS ABSOLUTE COUNT: 0 10*3/uL (ref 0.0–0.1)
BASOPHILS ABSOLUTE COUNT: 0 10*3/uL (ref 0.0–0.2)
EOSINOPHILS ABSOLUTE COUNT: 0.1 10*3/uL (ref 0.0–0.4)
HEMATOCRIT: 47.6 % (ref 37.5–51.0)
HEMOGLOBIN: 16.2 g/dL (ref 13.0–17.7)
LYMPHOCYTES ABSOLUTE COUNT: 1.4 10*3/uL (ref 0.7–3.1)
LYMPHOCYTES RELATIVE PERCENT: 34 %
MEAN CORPUSCULAR HEMOGLOBIN CONC: 34 g/dL (ref 31.5–35.7)
MEAN CORPUSCULAR HEMOGLOBIN: 30.6 pg (ref 26.6–33.0)
MEAN CORPUSCULAR VOLUME: 90 fL (ref 79–97)
MONOCYTES ABSOLUTE COUNT: 0.3 10*3/uL (ref 0.1–0.9)
MONOCYTES RELATIVE PERCENT: 7 %
NEUTROPHILS ABSOLUTE COUNT: 2.3 10*3/uL (ref 1.4–7.0)
NEUTROPHILS RELATIVE PERCENT: 54 %
PLATELET COUNT: 134 10*3/uL — ABNORMAL LOW (ref 150–450)
RED BLOOD CELL COUNT: 5.3 x10E6/uL (ref 4.14–5.80)
RED CELL DISTRIBUTION WIDTH: 14.1 % (ref 12.3–15.4)
WHITE BLOOD CELL COUNT: 4.2 10*3/uL (ref 3.4–10.8)

## 2018-05-20 LAB — CO2: Lab: 22

## 2018-05-20 LAB — MAGNESIUM: Lab: 1.8

## 2018-05-20 LAB — MEAN CORPUSCULAR HEMOGLOBIN: Lab: 30.6

## 2018-05-20 LAB — BILIRUBIN DIRECT: Lab: 0.14

## 2018-05-20 LAB — GAMMA GLUTAMYL TRANSFERASE: Lab: 24

## 2018-05-20 LAB — PHOSPHORUS, SERUM: Lab: 2.9

## 2018-05-21 LAB — TACROLIMUS BLOOD: Lab: 4

## 2018-06-10 NOTE — Unmapped (Signed)
[facility] Specialty Pharmacy Refill Coordination Note    Specialty Medication(s) to be Shipped:   Transplant: Myfortic 180mg  and Prograf 1mg     Other medication(s) to be shipped: none     [employer], DOB: 1946/04/29  Phone: [phone] (home)       All above HIPAA information was verified with patient.     Completed refill call assessment today to schedule patient's medication shipment from the [facility] [phone]).       Specialty medication(s) and dose(s) confirmed: Regimen is correct and unchanged.   Changes to medications: [name] reports no changes reported at this time.  Changes to insurance: No  Questions for the pharmacist: No    The patient will receive a drug information handout for each medication shipped and additional FDA Medication Guides as required.      DISEASE/MEDICATION-SPECIFIC INFORMATION        N/A    ADHERENCE     Medication Adherence    Patient reported X missed doses in the last month:  0                          MEDICARE PART B DOCUMENTATION     Myfortic 180mg : Patient has 10 days worth of tablets on hand.  Prograf 1mg : Patient has 10 days worth of capsules on hand.    SHIPPING     Shipping address confirmed in Epic.     Delivery Scheduled: Yes, Expected medication delivery date: 06/17/18 via UPS or courier.     Medication will be delivered via UPS to the home address in Epic WAM.    Trevor Weber   [facility] Shared [facility] Specialty Technician

## 2018-06-16 MED FILL — MYFORTIC 180 MG TABLET,DELAYED RELEASE: ORAL | 30 days supply | Qty: 60 | Fill #3

## 2018-06-16 MED FILL — PROGRAF 1 MG CAPSULE: ORAL | 30 days supply | Qty: 60 | Fill #1

## 2018-06-16 MED FILL — PROGRAF 1 MG CAPSULE: 30 days supply | Qty: 60 | Fill #1 | Status: AC

## 2018-06-16 MED FILL — MYFORTIC 180 MG TABLET,DELAYED RELEASE: 30 days supply | Qty: 60 | Fill #3 | Status: AC

## 2018-07-11 NOTE — Unmapped (Signed)
[facility] Pharm[facility] Refill Coordination Note    Specialty Medication(s) to be Shipped:   Transplant: Myfortic 180mg  and Prograf 1mg     Other medication(s) to be shipped: N/A     Layn Kye, DOB: Feb 19, 1946  Phone: [phone] (home)       All above HIPAA information was verified with patient.     Completed refill call assessment today to schedule patient's medication shipment from the [facility] [phone]).       Specialty medication(s) and dose(s) confirmed: Regimen is correct and unchanged.   Changes to medications: Nashaun reports no changes reported at this time.  Changes to insurance: No  Questions for the pharmacist: No    The patient will receive a drug information handout for each medication shipped and additional FDA Medication Guides as required.      DISEASE/MEDICATION-SPECIFIC INFORMATION        N/A    ADHERENCE              MEDICARE PART B DOCUMENTATION     Myfortic 180mg : Patient has 7 days of tablets on hand.  Prograf 1mg : Patient has 7 days of capsules on hand.    SHIPPING     Shipping address confirmed in Epic.     Delivery Scheduled: Yes, Expected medication delivery date: 07/16/18 via UPS or courier.     Medication will be delivered via UPS to the prescription address in Epic [place].    Jasper Loser   [facility] Specialty Technician

## 2018-07-11 NOTE — Unmapped (Signed)
While patient no longer requires abdominal MRI for hcc surveillance will complete MRI @ annual d/t recent h/o renal lesion in which previous imaging suggested attention on follow-up.

## 2018-07-14 NOTE — Unmapped (Signed)
Called patient to discuss scheduling liver transplant annual appointments, patient confirmed 08/21/18 will work, appointment letter mailed to patient.

## 2018-07-15 MED FILL — MYFORTIC 180 MG TABLET,DELAYED RELEASE: ORAL | 30 days supply | Qty: 60 | Fill #4

## 2018-07-15 MED FILL — PROGRAF 1 MG CAPSULE: 30 days supply | Qty: 60 | Fill #2 | Status: AC

## 2018-07-15 MED FILL — PROGRAF 1 MG CAPSULE: ORAL | 30 days supply | Qty: 60 | Fill #2

## 2018-07-15 MED FILL — MYFORTIC 180 MG TABLET,DELAYED RELEASE: 30 days supply | Qty: 60 | Fill #4 | Status: AC

## 2018-08-05 NOTE — Unmapped (Signed)
[facility] Refill Coordination Note    Specialty Medication(s) to be Shipped:   Transplant: Myfortic 180mg  and Prograf 1mg      Trevor Weber, DOB: August 27, 1945  Phone: [phone] (home)     All above HIPAA information was verified with patient.     Completed refill call assessment today to schedule patient's medication shipment from the [facility] [phone]).       Specialty medication(s) and dose(s) confirmed: Regimen is correct and unchanged.   Changes to medications: Trevor Weber reports no changes reported at this time.  Changes to insurance: No  Questions for the pharmacist: No    The patient will receive a drug information handout for each medication shipped and additional FDA Medication Guides as required.      DISEASE/MEDICATION-SPECIFIC INFORMATION        N/A    ADHERENCE     Medication Adherence    Patient reported X missed doses in the last month:  0  Specialty Medication:  Myfrtic 180mg  & Prograf 1mg    Patient is on additional specialty medications:  No  Patient is on more than two specialty medications:  No  Any gaps in refill history greater than 2 weeks in the last 3 months:  no  Demonstrates understanding of importance of adherence:  yes  Informant:  patient  Reliability of informant:  reliable  Adherence tools used:  patient uses a pill box to manage medications  Confirmed plan for next specialty medication refill:  delivery by pharmacy          Refill Coordination    Has the Patients' Contact Information Changed:  No  Is the Shipping Address Different:  No         MEDICARE PART B DOCUMENTATION     Myfortic 180mg : Patient has 10 days worth of tablets on hand.  Prograf 1mg : Patient has 10 days worth of capsules on hand.    SHIPPING     Shipping address confirmed in Epic.     Delivery Scheduled: Yes, Expected medication delivery date: 08/15/2018 via UPS or courier.     Medication will be delivered via UPS to the home address in Epic [place].    Trevor Weber   [facility] Specialty Technician

## 2018-08-14 MED FILL — PROGRAF 1 MG CAPSULE: 30 days supply | Qty: 60 | Fill #3 | Status: AC

## 2018-08-14 MED FILL — MYFORTIC 180 MG TABLET,DELAYED RELEASE: ORAL | 30 days supply | Qty: 60 | Fill #5

## 2018-08-14 MED FILL — MYFORTIC 180 MG TABLET,DELAYED RELEASE: 30 days supply | Qty: 60 | Fill #5 | Status: AC

## 2018-08-14 MED FILL — PROGRAF 1 MG CAPSULE: ORAL | 30 days supply | Qty: 60 | Fill #3

## 2018-08-21 ENCOUNTER — Ambulatory Visit: Admit: 2018-08-21 | Discharge: 2018-08-22 | Payer: MEDICARE

## 2018-08-21 DIAGNOSIS — Z79899 Other long term (current) drug therapy: Secondary | ICD-10-CM

## 2018-08-21 DIAGNOSIS — Z940 Kidney transplant status: Principal | ICD-10-CM

## 2018-08-21 DIAGNOSIS — Z944 Liver transplant status: Principal | ICD-10-CM

## 2018-08-21 DIAGNOSIS — C220 Liver cell carcinoma: Secondary | ICD-10-CM

## 2018-08-21 DIAGNOSIS — D899 Disorder involving the immune mechanism, unspecified: Secondary | ICD-10-CM

## 2018-08-21 DIAGNOSIS — N289 Disorder of kidney and ureter, unspecified: Secondary | ICD-10-CM

## 2018-08-21 DIAGNOSIS — M109 Gout, unspecified: Secondary | ICD-10-CM

## 2018-08-21 LAB — CBC W/ AUTO DIFF
BASOPHILS ABSOLUTE COUNT: 0 10*9/L (ref 0.0–0.1)
BASOPHILS RELATIVE PERCENT: 0.9 %
EOSINOPHILS RELATIVE PERCENT: 1.9 %
HEMATOCRIT: 45.5 % (ref 41.0–53.0)
HEMOGLOBIN: 15.8 g/dL (ref 13.5–17.5)
LARGE UNSTAINED CELLS: 4 % (ref 0–4)
LYMPHOCYTES ABSOLUTE COUNT: 1.3 10*9/L — ABNORMAL LOW (ref 1.5–5.0)
LYMPHOCYTES RELATIVE PERCENT: 34.8 %
MEAN CORPUSCULAR HEMOGLOBIN CONC: 34.7 g/dL (ref 31.0–37.0)
MEAN CORPUSCULAR HEMOGLOBIN: 30.4 pg (ref 26.0–34.0)
MEAN CORPUSCULAR VOLUME: 87.6 fL (ref 80.0–100.0)
MEAN PLATELET VOLUME: 7.4 fL (ref 7.0–10.0)
MONOCYTES ABSOLUTE COUNT: 0.2 10*9/L (ref 0.2–0.8)
MONOCYTES RELATIVE PERCENT: 6.4 %
NEUTROPHILS ABSOLUTE COUNT: 2 10*9/L (ref 2.0–7.5)
NEUTROPHILS RELATIVE PERCENT: 52.6 %
PLATELET COUNT: 111 10*9/L — ABNORMAL LOW (ref 150–440)
RED BLOOD CELL COUNT: 5.19 10*12/L (ref 4.50–5.90)
RED CELL DISTRIBUTION WIDTH: 14.6 % (ref 12.0–15.0)

## 2018-08-21 LAB — MAGNESIUM: Magnesium:MCnc:Pt:Ser/Plas:Qn:: 1.8

## 2018-08-21 LAB — COMPREHENSIVE METABOLIC PANEL
ALBUMIN: 3.7 g/dL (ref 3.5–5.0)
ALKALINE PHOSPHATASE: 112 U/L (ref 38–126)
ALT (SGPT): 25 U/L (ref <50)
ANION GAP: 5 mmol/L — ABNORMAL LOW (ref 7–15)
AST (SGOT): 30 U/L (ref 17–47)
BILIRUBIN TOTAL: 0.7 mg/dL (ref 0.0–1.2)
BLOOD UREA NITROGEN: 16 mg/dL (ref 7–21)
BUN / CREAT RATIO: 14
CALCIUM: 8.7 mg/dL (ref 8.5–10.2)
CHLORIDE: 108 mmol/L — ABNORMAL HIGH (ref 98–107)
CO2: 27 mmol/L (ref 22.0–30.0)
CREATININE: 1.18 mg/dL (ref 0.70–1.30)
EGFR CKD-EPI AA MALE: 72 mL/min/{1.73_m2} (ref >=60)
EGFR CKD-EPI NON-AA MALE: 62 mL/min/{1.73_m2} (ref >=60)
GLUCOSE RANDOM: 115 mg/dL (ref 70–179)
POTASSIUM: 4.6 mmol/L (ref 3.5–5.0)
PROTEIN TOTAL: 6.8 g/dL (ref 6.5–8.3)
SODIUM: 140 mmol/L (ref 135–145)

## 2018-08-21 LAB — GAMMA GLUTAMYL TRANSFERASE: Gamma glutamyl transferase:CCnc:Pt:Ser/Plas:Qn:: 31

## 2018-08-21 LAB — HEMATOCRIT: Lab: 45.5

## 2018-08-21 LAB — TACROLIMUS BLOOD: Lab: 4.5

## 2018-08-21 LAB — PHOSPHORUS: Phosphate:MCnc:Pt:Ser/Plas:Qn:: 3.3

## 2018-08-21 LAB — BILIRUBIN DIRECT: Bilirubin.glucuronidated:MCnc:Pt:Ser/Plas:Qn:: 0.2

## 2018-08-21 LAB — CO2: Carbon dioxide:SCnc:Pt:Ser/Plas:Qn:: 27

## 2018-08-21 MED ORDER — COLCHICINE 0.6 MG TABLET
ORAL_TABLET | 2 refills | 0 days | Status: CP
Start: 2018-08-21 — End: ?

## 2018-08-21 NOTE — Unmapped (Signed)
FOLLOW UP ANNUAL KIDNEY LIVER CLINIC NOTE     Patient Name: Trevor Weber [facility]  Medical Record Number: [id number]  Date of Service: 08/22/2018    Referring Physician: Jay Schlichter   Current complaint: Follow up Annual Liver    Assessment/Plan:     Arlyn Buerkle is a 73 y.o. 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  Recent testing WNL and immunosuppressive levels were within goal range (4-6), at 4.5 today.   Continue tacrolimus 1.5mg /1mg  and myfortic 180mg  BID.     Patient denies any recent illness, hospitalization, or injury. His weight remains elevated, with an increase of ~3 kg over the last year. He states that he has joined the [facility] over the last 3 mo and swims for exercise at least 3 times per week.     Wt Readings from Last 6 Encounters:   08/21/18 (!) 146.7 kg (323 lb 6.4 oz)   08/20/17 (!) 143 kg (315 lb 4.8 oz)   11/29/16 (!) 141.8 kg (312 lb 9.6 oz)   11/16/16 (!) 140.1 kg (308 lb 12.8 oz)   08/23/16 (!) 146.1 kg (322 lb)   08/16/16 (!) 148.8 kg (328 lb 1.6 oz)     MRIs with annual evaluations due to lesion in the interpolar native right kidney, noted on 05/2017 MRI.   Can switch to annual contrast U/S to monitor lesion, as it is stable on repeat MRI today. Will defer to nephrology for scheduling. Continue with liver US.    Refilled Colcrys per patient request due to several gout flare ups per year, mainly in hands. Encouraged hydration at least 2-3L during these flare ups given colcrys use.  He also takes allopurinol for daily maintenance therapy.     HEALTH MAINTENANCE:   - Dermatology: This patient is at increased risk for the development of skin cancers due to immunosuppressant medications. We recommend yearly surveillance. Is being seen by dermatology for spot on top of head.   - Colonoscopy: Until age 15, we recommend routine surveillance. Most recent 2018, negative.  - Dental: Cleanings 1 - 2 times per year.  - Mental health: No concerns at this time.    Immunization History   Administered Date(s) Administered   ??? Hepatitis B, Adult 03/12/2011, 04/09/2011, 09/03/2011, 05/16/2012, 06/02/2012, 06/16/2012   ??? INFLUENZA TIV (TRI) 37MO+ W/ PRESERV (IM) 07/16/2012   ??? INFLUENZA TIV (TRI) PF (IM) 03/12/2011   ??? Influenza Vaccine Quad (IIV4 PF) 69mo+ injectable 03/30/2015, 03/21/2016   ??? Influenza Virus Vaccine, unspecified formulation 03/16/2013   ??? PNEUMOCOCCAL POLYSACCHARIDE 23 03/18/2013   ??? PPD Test 06/25/2011, 10/16/2013   ??? Pneumococcal Conjugate 13-Valent 06/01/2015   ??? SHINGRIX-ZOSTER VACCINE (HZV), RECOMBINANT,SUB-UNIT,ADJUVANTED IM 08/20/2017   ??? Tetanus and diptheria, adsorbed,(adult), 5Lf tetanus toxoid,PF 10/16/2013     Reeducated patient on the importance of preventing illness, infection, and opportunistic cancers and the rationale behind vaccines and routine imaging, given his status as an immunocompromised person.     Flu vaccine 04/2018.  Shingrex #2 at PCP early 2019.    Return to clinic: 1 year, sooner if needed.  Labs: Every 3 mo.    I spent a total of 30 minutes of which 50% was spent in counseling and coordination of care.    Subjective:     HPI: Trevor Weber is a 73 y.o. 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 He has had minimal changes to  health since previous visit. His posttransplant course has been free of rejection episodes, HCC recurrence, or DSA detection. He has not been hospitalized or ill. No acute concerns presented today.  ??  Denies fever, chills, arthralgias, weight loss/gain, and??fatigue. Denies chest pain, SOB, N/V/D, constipation or abdominal pain. No acute complaint today.    Past Medical History:   Diagnosis Date   ??? Actinic keratosis    ??? End-stage liver disease (CMS-HCC)    ??? ESRD (end stage renal disease) (CMS-HCC)    ??? HCC (hepatocellular carcinoma) (CMS-HCC)     s/p chemoembolization on 01/2012   ??? IgA nephropathy    ??? NASH (nonalcoholic steatohepatitis)        Past Surgical History:   Procedure Laterality Date   ??? AV FISTULA PLACEMENT Left    ??? CARPAL TUNNEL RELEASE Bilateral    ??? liver tranplant     ??? NEPHRECTOMY TRANSPLANTED ORGAN     ??? PR LIGATN ANGIOACCESS AV FISTULA Left 09/08/2013    Procedure: LIGATION OR BANDING OF ANGIOACCESS ARTERIOVENOUS FISTULA;  Surgeon: Purcell Mouton, MD;  Location: MAIN OR [facility];  Service: Transplant   ??? UMBILICAL HERNIA REPAIR         Family History   Problem Relation Age of Onset   ??? No Known Problems Mother    ??? No Known Problems Father    ??? Kidney cancer Neg Hx    ??? Melanoma Neg Hx    ??? Basal cell carcinoma Neg Hx    ??? Squamous cell carcinoma Neg Hx    ??? Anesthesia problems Neg Hx    ??? Bleeding Disorder Neg Hx        Social History     Socioeconomic History   ??? Marital status: Married     Spouse name: Not on file   ??? Number of children: Not on file   ??? Years of education: college   ??? Highest education level: Not on file   Occupational History   ??? Occupation: retired   Chief Executive Officer Needs   ??? Financial resource strain: Not on file   ??? Food insecurity     Worry: Not on file     Inability: Not on file   ??? Transportation needs     Medical: Not on file     Non-medical: Not on file   Tobacco Use   ??? Smoking status: Never Smoker   ??? Smokeless tobacco: Never Used   Substance and Sexual Activity   ??? Alcohol use: No     Alcohol/week: 0.0 standard drinks   ??? Drug use: No   ??? Sexual activity: Not on file   Lifestyle   ??? Physical activity     Days per week: Not on file     Minutes per session: Not on file   ??? Stress: Not on file   Relationships   ??? Social Wellsite geologist on phone: Not on file     Gets together: Not on file     Attends religious service: Not on file     Active member of club or organization: Not on file     Attends meetings of clubs or organizations: Not on file     Relationship status: Not on file   Other Topics Concern   ??? Do you use sunscreen? Yes   ??? Tanning bed use? No   ??? Are you easily burned? No   ??? Excessive sun exposure? No   ??? Blistering sunburns?  No   Social History Narrative   ??? Not on file       REVIEW OF SYSTEMS:   The balance of 10/12 systems is negative with the exception of HPI.    Objective:     MEDICATIONS:  Allergies   Allergen Reactions   ??? Cephalosporins Other (See Comments)     Liver/Kidney failure  Liver/Kidney failure  Pt reports kidney damage from zyvox   ??? Linezolid Nausea Only and Other (See Comments)     Liver/Kidney failure.   Other reaction(s): Other (See Comments)  Liver/Kidney failure.        Current Outpatient Medications   Medication Sig Dispense Refill   ??? allopurinol (ZYLOPRIM) 300 MG tablet Take 300 mg by mouth daily.     ??? colchicine (COLCRYS) 0.6 mg tablet Take 2 tab BID x 2 days, take 1 tab BID for up to 5 days total. 30 tablet 2   ??? MYFORTIC 180 mg EC tablet TAKE 1 TABLET BY MOUTH TWICE DAILY 60 tablet 9   ??? PROGRAF 1 mg capsule Take 1 capsule (1 mg total) by mouth two (2) times a day. 60 capsule 11     No current facility-administered medications for this visit.          PHYSICAL EXAM:  BP 194/90  - Pulse 83  - Temp 36.4 ??C (97.5 ??F) (Tympanic)  - Ht 182.9 cm (6')  - Wt (!) 146.7 kg (323 lb 6.4 oz)  - SpO2 91%  - BMI 43.86 kg/m??     General Appearance:  NAD, well appearing obese   HEENT:  [place]/AT. Well hydrated moist mucous membranes of the oral cavity. No scleral icterus. No cervical lymphadenopathy.   Pulmonary:    Normal respiratory effort. CTAB, without wheezes/crackles/rhonchi. Good air movement.    Cardiovascular:  Regular rate and rhythm, no murmur noted.   Extremities No edema.    Abdomen:   Normoactive bowel sounds, abdomen soft, non-tender and not distended, no Hepatosplenomegaly or masses. Abdominal scar well healed with hernia   Musculoskeletal: No joint tenderness, full ROM. Normal gait.    Skin: Skin color, texture, turgor normal, no rashes or lesions. 3, eraser-sized lesions to top of scalp. Pink, scaly, and hard to touch, slightly raised, with irregular borders.    Neurologic: Grossly intact. Mild bilateral hand tremors.    Psychiatric: Judgement and insight appropriate.        LAB RESULTS:  All lab results last 24 hours:    Recent Results (from the past 48 hour(s))   Comprehensive Metabolic Panel    Collection Time: 08/21/18  8:09 AM   Result Value Ref Range    Sodium 140 135 - 145 mmol/L    Potassium 4.6 3.5 - 5.0 mmol/L    Chloride 108 (H) 98 - 107 mmol/L    Anion Gap 5 (L) 7 - 15 mmol/L    CO2 27.0 22.0 - 30.0 mmol/L    BUN 16 7 - 21 mg/dL    Creatinine 1.61 0.96 - 1.30 mg/dL    BUN/Creatinine Ratio 14     EGFR CKD-EPI Non-African American, Male 106 >=60 mL/min/1.55m2    EGFR CKD-EPI African American, Male 36 >=60 mL/min/1.41m2    Glucose 115 70 - 179 mg/dL    Calcium 8.7 8.5 - 04.5 mg/dL    Albumin 3.7 3.5 - 5.0 g/dL    Total Protein 6.8 6.5 - 8.3 g/dL    Total Bilirubin 0.7 0.0 - 1.2 mg/dL  AST 30 17 - 47 U/L    ALT 25 <50 U/L    Alkaline Phosphatase 112 38 - 126 U/L   Bilirubin, Direct    Collection Time: 08/21/18  8:09 AM   Result Value Ref Range    Bilirubin, Direct 0.20 0.00 - 0.40 mg/dL   Gamma GT (GGT)    Collection Time: 08/21/18  8:09 AM   Result Value Ref Range    GGT 31 12 - 109 U/L   Magnesium Level    Collection Time: 08/21/18  8:09 AM   Result Value Ref Range    Magnesium 1.8 1.6 - 2.2 mg/dL   Phosphorus Level    Collection Time: 08/21/18  8:09 AM   Result Value Ref Range    Phosphorus 3.3 2.9 - 4.7 mg/dL   Tacrolimus level    Collection Time: 08/21/18  8:09 AM   Result Value Ref Range    Tacrolimus, Timed 4.5 ng/mL   CBC w/ Differential    Collection Time: 08/21/18  8:09 AM   Result Value Ref Range    WBC 3.8 (L) 4.5 - 11.0 10*9/L    RBC 5.19 4.50 - 5.90 10*12/L    HGB 15.8 13.5 - 17.5 g/dL    HCT 16.1 09.6 - 04.5 %    MCV 87.6 80.0 - 100.0 fL    MCH 30.4 26.0 - 34.0 pg    MCHC 34.7 31.0 - 37.0 g/dL    RDW 40.9 81.1 - 91.4 %    MPV 7.4 7.0 - 10.0 fL    Platelet 111 (L) 150 - 440 10*9/L    Neutrophils % 52.6 %    Lymphocytes % 34.8 % Monocytes % 6.4 %    Eosinophils % 1.9 %    Basophils % 0.9 %    Absolute Neutrophils 2.0 2.0 - 7.5 10*9/L    Absolute Lymphocytes 1.3 (L) 1.5 - 5.0 10*9/L    Absolute Monocytes 0.2 0.2 - 0.8 10*9/L    Absolute Eosinophils 0.1 0.0 - 0.4 10*9/L    Absolute Basophils 0.0 0.0 - 0.1 10*9/L    Large Unstained Cells 4 0 - 4 %   POCT Creatinine    Collection Time: 08/21/18  9:29 AM   Result Value Ref Range    Creatinine Whole Blood, POC 1.3 0.8 - 1.4 mg/dL    EGFR CKD-EPI African American Male 59 >=60 mL/min/1.53m2    EGFR CKD-EPI Non-African American Male 55 (L) >=60 mL/min/1.47m2         IMAGING:  Mri Abdomen W Wo Contrast  Result Date: 08/21/2018  - Sequelae of liver transplant without evidence of hepatocellular carcinoma. - Unchanged indeterminate subcentimeter enhancing lesion in the interpolar native right kidney. Continued attention on follow-up. 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    Xr Chest 2 Views  Result Date: 08/21/2018  Clear lungs.     US Renal Transplant W Native Kidneys  Result Date: 08/21/2018  Small, bilateral echogenic native kidneys, consistent with chronic medical renal disease. No significant change compared with prior study. Relatively stable resistive indices in the renal transplant arteries, within normal limits. Please see below for data measurements: RLQ kidney: length 11.1 cm; width 6.6 cm; height 6.4 cm Arcuate artery superior resistive index: 0.71 Arcuate artery mid resistive index: 0.70 Arcuate artery inferior resistive index: 0.74 Previous resistive indices range of arcuate arteries: 0.67-0.70 Segmental artery superior resistive index: 0.75 Segmental artery mid resistive index: 0.77 Segmental artery inferior resistive index: 0.73 Previous  resistive indices range of segmental arteries: 0.70-0.74 Main renal artery hilum resistive index: 0.76 Main renal artery mid resistive index: 0.82 Main renal artery anastomosis resistive index: 0.78 Previous resistive indices range of main renal artery: 0.80-0.83 Main renal vein: patent Iliac artery: Patent Iliac vein: Patent Bladder volume prevoid: 140.5  mL Native right kidney: 8.1 cm Native left kidney: 9.2 cm     ______________________________________________  Lamar Blinks, AGPCNP-Student       Gertie Fey, DNP, APRN, FNP-C  [facility]  [address]  [place], [place]  [address]

## 2018-09-03 NOTE — Unmapped (Signed)
Reviewed patient's recent MRI in 3/4 HB conference specifically for noted renal mass that has been followed since 2017.  Given per hcc surveillance protocol MRIs no longer required for screening for post liver transplant as he is >5 years out discussed suggested annual imaging.  Per team decision will follow annually with CT renal mass protocol, liver ultrasound AND renal ultrasound for annual imaging.

## 2018-09-08 DIAGNOSIS — Z944 Liver transplant status: Principal | ICD-10-CM

## 2018-09-08 DIAGNOSIS — Z79899 Other long term (current) drug therapy: Principal | ICD-10-CM

## 2018-09-09 NOTE — Unmapped (Signed)
[facility] Clinical Assessment & Refill Coordination Note    Trevor Weber, [place]: 1945/11/27  Phone: [phone] (home) [phone] (work)    All above HIPAA information was verified with patient.     Specialty Medication(s):   Transplant: Myfortic 180mg  and Prograf 1mg      Current Outpatient Medications   Medication Sig Dispense Refill   ??? allopurinol (ZYLOPRIM) 300 MG tablet Take 300 mg by mouth daily.     ??? colchicine (COLCRYS) 0.6 mg tablet Take 2 tab BID x 2 days, take 1 tab BID for up to 5 days total. 30 tablet 2   ??? MYFORTIC 180 mg EC tablet TAKE 1 TABLET BY MOUTH TWICE DAILY 60 tablet 9   ??? PROGRAF 1 mg capsule Take 1 capsule (1 mg total) by mouth two (2) times a day. 60 capsule 11     No current facility-administered medications for this visit.         Changes to medications: Trevor Weber reports no changes reported at this time.    Allergies   Allergen Reactions   ??? Cephalosporins Other (See Comments)     Liver/Kidney failure  Liver/Kidney failure  Pt reports kidney damage from zyvox   ??? Linezolid Nausea Only and Other (See Comments)     Liver/Kidney failure.   Other reaction(s): Other (See Comments)  Liver/Kidney failure.        Changes to allergies: No    SPECIALTY MEDICATION ADHERENCE     Myfortic 180 mg: 10 days of medicine on hand   Prograf 1 mg: 10 days of medicine on hand       Medication Adherence    Adherence tools used:  patient uses a pill box to manage medications          Specialty medication(s) dose(s) confirmed: Regimen is correct and unchanged.     Are there any concerns with adherence? No    Adherence counseling provided? Not needed    CLINICAL MANAGEMENT AND INTERVENTION      Clinical Benefit Assessment:    Do you feel the medicine is effective or helping your condition? Yes    Clinical Benefit counseling provided? Not needed    Adverse Effects Assessment:    Are you experiencing any side effects? No    Are you experiencing difficulty administering your medicine? No    Quality of Life Assessment:    How many days over the past month did your liver-kidney transplant  keep you from your normal activities? For example, brushing your teeth or getting up in the morning. 0    Have you discussed this with your provider? Not needed    Therapy Appropriateness:    Is therapy appropriate? Yes, therapy is appropriate and should be continued    DISEASE/MEDICATION-SPECIFIC INFORMATION      N/A    PATIENT SPECIFIC NEEDS     ? Does the patient have any physical, cognitive, or cultural barriers? No    ? Is the patient high risk? Yes, patient taking a REMS drug     ? Does the patient require a Care Management Plan? No     ? Does the patient require physician intervention or other additional services (i.e. nutrition, smoking cessation, social work)? No      SHIPPING     Specialty Medication(s) to be Shipped:   Transplant: Myfortic 180mg  and Prograf 1mg     Other medication(s) to be shipped: NONE     Changes to insurance: No  Delivery Scheduled: Yes, Expected medication delivery date: 09/17/2018.     Medication will be delivered via UPS to the confirmed home address in [facility].    The patient will receive a drug information handout for each medication shipped and additional FDA Medication Guides as required.  Verified that patient has previously received a Conservation officer, historic buildings.    Tera Helper   [facility] Specialty Pharmacist

## 2018-09-16 MED FILL — PROGRAF 1 MG CAPSULE: ORAL | 30 days supply | Qty: 60 | Fill #4

## 2018-09-16 MED FILL — MYFORTIC 180 MG TABLET,DELAYED RELEASE: ORAL | 30 days supply | Qty: 60 | Fill #6

## 2018-09-16 MED FILL — PROGRAF 1 MG CAPSULE: 30 days supply | Qty: 60 | Fill #4 | Status: AC

## 2018-09-16 MED FILL — MYFORTIC 180 MG TABLET,DELAYED RELEASE: 30 days supply | Qty: 60 | Fill #6 | Status: AC

## 2018-10-07 LAB — COMPREHENSIVE METABOLIC PANEL
A/G RATIO: 1.7 (ref 1.2–2.2)
ALBUMIN: 4.1 g/dL (ref 3.8–4.8)
ALKALINE PHOSPHATASE: 111 IU/L (ref 39–117)
ALKALINE PHOSPHATASE: 111 IU/L — ABNORMAL LOW (ref 39–117)
ALT (SGPT): 24 IU/L (ref 0–44)
BILIRUBIN TOTAL: 0.5 mg/dL (ref 0.0–1.2)
BUN / CREAT RATIO: 12 (ref 10–24)
CALCIUM: 9.2 mg/dL (ref 8.6–10.2)
CHLORIDE: 102 mmol/L (ref 96–106)
CO2: 23 mmol/L (ref 20–29)
CREATININE: 1.26 mg/dL (ref 0.76–1.27)
GFR MDRD AF AMER: 66 mL/min/{1.73_m2}
GFR MDRD NON AF AMER: 57 mL/min/{1.73_m2} — ABNORMAL LOW
GLOBULIN, TOTAL: 2.4 g/dL (ref 1.5–4.5)
GLUCOSE: 118 mg/dL — ABNORMAL HIGH (ref 65–99)
POTASSIUM: 4.1 mmol/L (ref 3.5–5.2)
SODIUM: 139 mmol/L (ref 134–144)
TOTAL PROTEIN: 6.5 g/dL (ref 6.0–8.5)

## 2018-10-07 LAB — CBC W/ DIFFERENTIAL
BANDED NEUTROPHILS ABSOLUTE COUNT: 0 10*3/uL (ref 0.0–0.1)
BASOPHILS ABSOLUTE COUNT: 0.1 10*3/uL (ref 0.0–0.2)
BASOPHILS RELATIVE PERCENT: 1 %
EOSINOPHILS ABSOLUTE COUNT: 0.1 10*3/uL (ref 0.0–0.4)
EOSINOPHILS RELATIVE PERCENT: 2 %
HEMATOCRIT: 49.7 % (ref 37.5–51.0)
HEMOGLOBIN: 16.6 g/dL (ref 13.0–17.7)
IMMATURE GRANULOCYTES: 1 %
LYMPHOCYTES ABSOLUTE COUNT: 1.5 10*3/uL (ref 0.7–3.1)
LYMPHOCYTES RELATIVE PERCENT: 36 %
MEAN CORPUSCULAR HEMOGLOBIN CONC: 33.4 g/dL (ref 31.5–35.7)
MEAN CORPUSCULAR HEMOGLOBIN: 30 pg (ref 26.6–33.0)
MEAN CORPUSCULAR VOLUME: 90 fL (ref 79–97)
MONOCYTES ABSOLUTE COUNT: 0.3 10*3/uL (ref 0.1–0.9)
MONOCYTES RELATIVE PERCENT: 8 %
NEUTROPHILS ABSOLUTE COUNT: 2.1 10*3/uL (ref 1.4–7.0)
PLATELET COUNT: 142 10*3/uL — ABNORMAL LOW (ref 150–450)
RED CELL DISTRIBUTION WIDTH: 14.6 % (ref 11.6–15.4)
WHITE BLOOD CELL COUNT: 4.2 10*3/uL (ref 3.4–10.8)

## 2018-10-13 NOTE — Unmapped (Signed)
[facility] Refill Coordination Note    Specialty Medication(s) to be Shipped:   Transplant: Myfortic 180mg  and Prograf 1mg      Trevor Weber, DOB: January 21, 1946  Phone: [phone] (home) [phone] (work)    All above HIPAA information was verified with patient.     Completed refill call assessment today to schedule patient's medication shipment from the [facility] [phone]).       Specialty medication(s) and dose(s) confirmed: Regimen is correct and unchanged.   Changes to medications: Trevor Weber reports no changes reported at this time.  Changes to insurance: No  Questions for the pharmacist: No    Confirmed patient received Welcome Packet with first shipment. The patient will receive a drug information handout for each medication shipped and additional FDA Medication Guides as required.       DISEASE/MEDICATION-SPECIFIC INFORMATION        N/A    SPECIALTY MEDICATION ADHERENCE     Medication Adherence    Patient reported X missed doses in the last month:  0  Specialty Medication:  Prograf 1mg    Patient is on additional specialty medications:  Yes  Additional Specialty Medications:  Myfortic 180mg   Patient Reported Additional Medication X Missed Doses in the Last Month:  0  Patient is on more than two specialty medications:  No  Any gaps in refill history greater than 2 weeks in the last 3 months:  no  Adherence tools used:  patient uses a pill box to manage medications        Myfortic 180 mg: 7 days of medicine on hand   Prograf 1 mg: 7 days of medicine on hand     SHIPPING     Shipping address confirmed in Epic.     Delivery Scheduled: Yes, Expected medication delivery date: 10/17/2018.     Medication will be delivered via UPS to the home address in [employer] [place].    Trevor Weber Trevor Weber   [facility] Specialty Technician

## 2018-10-16 MED FILL — MYFORTIC 180 MG TABLET,DELAYED RELEASE: ORAL | 30 days supply | Qty: 60 | Fill #7

## 2018-10-16 MED FILL — PROGRAF 1 MG CAPSULE: ORAL | 30 days supply | Qty: 60 | Fill #5

## 2018-10-16 MED FILL — MYFORTIC 180 MG TABLET,DELAYED RELEASE: 30 days supply | Qty: 60 | Fill #7 | Status: AC

## 2018-10-16 MED FILL — PROGRAF 1 MG CAPSULE: 30 days supply | Qty: 60 | Fill #5 | Status: AC

## 2018-11-06 NOTE — Unmapped (Signed)
[facility] Pharm[facility] Refill Coordination Note    Specialty Medication(s) to be Shipped:   Transplant: Myfortic 180mg  and Prograf 1mg      Trevor Weber, DOB: Dec 13, 1945  Phone: [phone] (home) [phone] (work)    All above HIPAA information was verified with patient.     Completed refill call assessment today to schedule patient's medication shipment from the [facility] [phone]).       Specialty medication(s) and dose(s) confirmed: Regimen is correct and unchanged.   Changes to medications: [name] reports no changes reported at this time.  Changes to insurance: No  Questions for the pharmacist: No    Confirmed patient received Welcome Packet with first shipment. The patient will receive a drug information handout for each medication shipped and additional FDA Medication Guides as required.       DISEASE/MEDICATION-SPECIFIC INFORMATION        N/A    SPECIALTY MEDICATION ADHERENCE     Medication Adherence    Patient reported X missed doses in the last month:  0  Specialty Medication:  Myfortic 180mg   Patient is on additional specialty medications:  Yes  Additional Specialty Medications:  Prograf 1mg    Patient Reported Additional Medication X Missed Doses in the Last Month:  0  Patient is on more than two specialty medications:  No  Adherence tools used:  patient uses a pill box to manage medications       Myfortic 180 mg: 9 days of medicine on hand   Prograf 1 mg: 9 days of medicine on hand     SHIPPING     Shipping address confirmed in Epic.     Delivery Scheduled: Yes, Expected medication delivery date: 11/14/2018.     Medication will be delivered via UPS to the home address in [place].    [name] [name]   [facility] Specialty Technician

## 2018-11-13 MED FILL — MYFORTIC 180 MG TABLET,DELAYED RELEASE: 30 days supply | Qty: 60 | Fill #8 | Status: AC

## 2018-11-13 MED FILL — MYFORTIC 180 MG TABLET,DELAYED RELEASE: ORAL | 30 days supply | Qty: 60 | Fill #8

## 2018-11-13 MED FILL — PROGRAF 1 MG CAPSULE: ORAL | 30 days supply | Qty: 60 | Fill #6

## 2018-11-13 MED FILL — PROGRAF 1 MG CAPSULE: 30 days supply | Qty: 60 | Fill #6 | Status: AC

## 2018-11-17 MED ORDER — ALLOPURINOL 300 MG TABLET
ORAL_TABLET | Freq: Every day | ORAL | 3 refills | 0.00000 days | Status: CP
Start: 2018-11-17 — End: ?

## 2018-11-18 NOTE — Unmapped (Signed)
Patient called to request refill of allopurinol - forwarded to kidney coordinator given Dr. Veronda Prude has provided these refills in past (patient with h/o gouty stone shortly following transplant) - escript provided.  Returned call to patient to relay script has been provided by kidney team - he was appreciative.

## 2018-12-08 NOTE — Unmapped (Signed)
[facility] Pharm[facility] Refill Coordination Note    Specialty Medication(s) to be Shipped:   Transplant: Myfortic 180mg  and Prograf 1mg      Trevor Weber, DOB: [birth year]  Phone: [phone] (home) [phone] (work)    All above HIPAA information was verified with patient.     Completed refill call assessment today to schedule patient's medication shipment from the [facility] [phone]).       Specialty medication(s) and dose(s) confirmed: Regimen is correct and unchanged.   Changes to medications: Trevor Weber reports no changes reported at this time.  Changes to insurance: No  Questions for the pharmacist: No    Confirmed patient received Welcome Packet with first shipment. The patient will receive a drug information handout for each medication shipped and additional FDA Medication Guides as required.       DISEASE/MEDICATION-SPECIFIC INFORMATION        N/A    SPECIALTY MEDICATION ADHERENCE     Medication Adherence    Patient reported X missed doses in the last month:  0  Specialty Medication:  Myfortic 180mg   Patient is on additional specialty medications:  Yes  Additional Specialty Medications:  Prograf 1mg    Patient Reported Additional Medication X Missed Doses in the Last Month:  0  Patient is on more than two specialty medications:  No  Adherence tools used:  patient uses a pill box to manage medications       Myfortic 180 mg: 10 days of medicine on hand   Prograf 1 mg: 10 days of medicine on hand     SHIPPING     Shipping address confirmed in Epic.     Delivery Scheduled: Yes, Expected medication delivery date: 12/16/2018.     Medication will be delivered via UPS to the home address in [place].    Trevor Weber   [facility] Specialty Technician

## 2018-12-08 NOTE — Unmapped (Signed)
Pt request for RX Refill

## 2018-12-09 MED ORDER — MYFORTIC 180 MG TABLET,DELAYED RELEASE
ORAL_TABLET | ORAL | 9 refills | 0 days | Status: CP
Start: 2018-12-09 — End: 2019-12-09
  Filled 2018-12-15: qty 60, 30d supply, fill #0

## 2018-12-15 MED FILL — PROGRAF 1 MG CAPSULE: 30 days supply | Qty: 60 | Fill #7 | Status: AC

## 2018-12-15 MED FILL — MYFORTIC 180 MG TABLET,DELAYED RELEASE: 30 days supply | Qty: 60 | Fill #0 | Status: AC

## 2018-12-16 MED ORDER — TACROLIMUS 1 MG CAPSULE
ORAL_CAPSULE | Freq: Two times a day (BID) | ORAL | 11 refills | 0.00000 days | Status: CP
Start: 2018-12-16 — End: 2018-12-17
  Filled 2018-12-15: qty 60, 30d supply, fill #7

## 2018-12-17 MED ORDER — TACROLIMUS 1 MG CAPSULE
ORAL_CAPSULE | Freq: Two times a day (BID) | ORAL | 3 refills | 0.00000 days | Status: CP
Start: 2018-12-17 — End: ?

## 2018-12-17 MED ORDER — MYCOPHENOLATE SODIUM 180 MG TABLET,DELAYED RELEASE
ORAL_TABLET | Freq: Two times a day (BID) | ORAL | 3 refills | 0 days | Status: CP
Start: 2018-12-17 — End: ?

## 2018-12-17 NOTE — Unmapped (Signed)
Patient called to relay he prefers to change pharmacies given [facility] requires signature upon receipt of medications and that moving forward [facility] wil transition to genreic tacrolimus.  He prefers to use Teacher, English as a foreign language in [place] thus scripted generic myfortic and tacrolimus to [facility] per patient request - patient confirms he will alert [facility].

## 2018-12-18 LAB — COMPREHENSIVE METABOLIC PANEL
A/G RATIO: 1.6 (ref 1.2–2.2)
ALBUMIN: 4 g/dL (ref 3.8–4.8)
ALT (SGPT): 22 IU/L (ref 0–44)
AST (SGOT): 22 IU/L (ref 0–40)
BUN / CREAT RATIO: 14 (ref 10–24)
CALCIUM: 9.2 mg/dL (ref 8.6–10.2)
CHLORIDE: 104 mmol/L (ref 96–106)
CO2: 20 mmol/L (ref 20–29)
CREATININE: 1.25 mg/dL (ref 0.76–1.27)
GFR MDRD AF AMER: 67 mL/min/{1.73_m2}
GFR MDRD NON AF AMER: 58 mL/min/{1.73_m2} — ABNORMAL LOW
GLOBULIN, TOTAL: 2.5 g/dL (ref 1.5–4.5)
GLUCOSE: 115 mg/dL — ABNORMAL HIGH (ref 65–99)
POTASSIUM: 4.3 mmol/L (ref 3.5–5.2)
SODIUM: 139 mmol/L (ref 134–144)
TOTAL PROTEIN: 6.5 g/dL (ref 6.0–8.5)

## 2018-12-18 LAB — CBC W/ DIFFERENTIAL
BANDED NEUTROPHILS ABSOLUTE COUNT: 0 10*3/uL (ref 0.0–0.1)
BASOPHILS ABSOLUTE COUNT: 0.1 10*3/uL (ref 0.0–0.2)
EOSINOPHILS ABSOLUTE COUNT: 0.1 10*3/uL (ref 0.0–0.4)
EOSINOPHILS RELATIVE PERCENT: 3 %
HEMATOCRIT: 49.9 % (ref 37.5–51.0)
HEMOGLOBIN: 16.6 g/dL (ref 13.0–17.7)
IMMATURE GRANULOCYTES: 1 %
LYMPHOCYTES ABSOLUTE COUNT: 1.4 10*3/uL (ref 0.7–3.1)
LYMPHOCYTES RELATIVE PERCENT: 33 %
MEAN CORPUSCULAR HEMOGLOBIN CONC: 33.3 g/dL (ref 31.5–35.7)
MEAN CORPUSCULAR HEMOGLOBIN: 29.3 pg (ref 26.6–33.0)
MEAN CORPUSCULAR VOLUME: 88 fL (ref 79–97)
MONOCYTES ABSOLUTE COUNT: 0.4 10*3/uL (ref 0.1–0.9)
NEUTROPHILS ABSOLUTE COUNT: 2.1 10*3/uL (ref 1.4–7.0)
NEUTROPHILS RELATIVE PERCENT: 52 %
PLATELET COUNT: 139 10*3/uL — ABNORMAL LOW (ref 150–450)
RED BLOOD CELL COUNT: 5.67 x10E6/uL (ref 4.14–5.80)
RED CELL DISTRIBUTION WIDTH: 14.4 % (ref 11.6–15.4)
WHITE BLOOD CELL COUNT: 4.1 10*3/uL (ref 3.4–10.8)

## 2018-12-18 LAB — PHOSPHORUS, SERUM: Phosphate:MCnc:Pt:Ser/Plas:Qn:: 2.8

## 2018-12-18 LAB — MAGNESIUM: Magnesium:MCnc:Pt:Ser/Plas:Qn:: 1.8

## 2018-12-18 LAB — GLUCOSE: Glucose:MCnc:Pt:Ser/Plas:Qn:: 115 — ABNORMAL HIGH

## 2018-12-18 LAB — BANDED NEUTROPHILS ABSOLUTE COUNT: Granulocytes.immature:NCnc:Pt:Bld:Qn:Automated count: 0

## 2018-12-18 LAB — BILIRUBIN DIRECT: Bilirubin.glucuronidated+Bilirubin.albumin bound:MCnc:Pt:Ser/Plas:Qn:: 0.14

## 2018-12-18 LAB — GAMMA GLUTAMYL TRANSFERASE: Gamma glutamyl transferase:CCnc:Pt:Ser/Plas:Qn:: 22

## 2018-12-20 LAB — TACROLIMUS BLOOD: Tacrolimus:MCnc:Pt:Bld:Qn:LC/MS/MS: 3.8

## 2019-01-07 NOTE — Unmapped (Signed)
Per Coordinator note on 6/17:Patient called to relay he prefers to change pharmacies given [facility] requires signature upon receipt of medications and that moving forward [facility] wil transition to genreic tacrolimus. He prefers to use Teacher, English as a foreign language in [place] thus scripted generic myfortic and tacrolimus to [facility] per patient request - patient confirms he will alert [facility].   Will disenroll from [facility] calls.

## 2019-02-25 DIAGNOSIS — Z9889 Other specified postprocedural states: Secondary | ICD-10-CM | POA: Insufficient documentation

## 2019-02-25 DIAGNOSIS — H43812 Vitreous degeneration, left eye: Secondary | ICD-10-CM | POA: Insufficient documentation

## 2019-02-25 DIAGNOSIS — H35033 Hypertensive retinopathy, bilateral: Secondary | ICD-10-CM | POA: Insufficient documentation

## 2019-04-23 DIAGNOSIS — Z79899 Other long term (current) drug therapy: Principal | ICD-10-CM

## 2019-04-23 DIAGNOSIS — Z944 Liver transplant status: Principal | ICD-10-CM

## 2019-05-02 DIAGNOSIS — D849 Immunodeficiency, unspecified: Secondary | ICD-10-CM | POA: Insufficient documentation

## 2019-05-02 DIAGNOSIS — Z940 Kidney transplant status: Secondary | ICD-10-CM | POA: Insufficient documentation

## 2019-05-18 DIAGNOSIS — Z79899 Other long term (current) drug therapy: Principal | ICD-10-CM

## 2019-05-18 DIAGNOSIS — Z944 Liver transplant status: Principal | ICD-10-CM

## 2019-06-15 DIAGNOSIS — Z944 Liver transplant status: Principal | ICD-10-CM

## 2019-06-15 DIAGNOSIS — Z79899 Other long term (current) drug therapy: Principal | ICD-10-CM

## 2019-06-30 DIAGNOSIS — Z944 Liver transplant status: Principal | ICD-10-CM

## 2019-06-30 DIAGNOSIS — Z79899 Other long term (current) drug therapy: Principal | ICD-10-CM

## 2019-06-30 DIAGNOSIS — Z940 Kidney transplant status: Principal | ICD-10-CM

## 2019-07-01 ENCOUNTER — Ambulatory Visit: Admit: 2019-07-01 | Discharge: 2019-07-01 | Payer: MEDICARE | Attending: Nephrology | Primary: Nephrology

## 2019-07-01 DIAGNOSIS — Z940 Kidney transplant status: Principal | ICD-10-CM

## 2019-07-01 DIAGNOSIS — Z944 Liver transplant status: Principal | ICD-10-CM

## 2019-07-01 DIAGNOSIS — Z79899 Other long term (current) drug therapy: Principal | ICD-10-CM

## 2019-07-01 MED ORDER — LOSARTAN 50 MG TABLET
ORAL_TABLET | Freq: Every day | ORAL | 3 refills | 90.00000 days | Status: CP
Start: 2019-07-01 — End: 2020-06-30

## 2019-07-13 DIAGNOSIS — Z79899 Other long term (current) drug therapy: Principal | ICD-10-CM

## 2019-07-13 DIAGNOSIS — Z944 Liver transplant status: Principal | ICD-10-CM

## 2019-07-14 DIAGNOSIS — I10 Essential (primary) hypertension: Principal | ICD-10-CM

## 2019-07-14 DIAGNOSIS — Z940 Kidney transplant status: Principal | ICD-10-CM

## 2019-07-14 MED ORDER — AMLODIPINE 5 MG TABLET
Freq: Every day | ORAL | 3 refills | 90.00000 days | Status: CP
Start: 2019-07-14 — End: ?

## 2019-08-10 DIAGNOSIS — Z944 Liver transplant status: Principal | ICD-10-CM

## 2019-08-10 DIAGNOSIS — Z79899 Other long term (current) drug therapy: Principal | ICD-10-CM

## 2019-08-11 DIAGNOSIS — Z944 Liver transplant status: Principal | ICD-10-CM

## 2019-08-11 DIAGNOSIS — Z79899 Other long term (current) drug therapy: Principal | ICD-10-CM

## 2019-08-11 DIAGNOSIS — N289 Disorder of kidney and ureter, unspecified: Principal | ICD-10-CM

## 2019-08-11 DIAGNOSIS — Z940 Kidney transplant status: Principal | ICD-10-CM

## 2019-08-22 ENCOUNTER — Ambulatory Visit: Payer: Medicare Other | Attending: Internal Medicine

## 2019-08-22 DIAGNOSIS — Z23 Encounter for immunization: Secondary | ICD-10-CM | POA: Insufficient documentation

## 2019-08-22 NOTE — Progress Notes (Signed)
   Covid-19 Vaccination Clinic  Name:  [name] [name]    MRN: [id number] DOB: Nov 14, 1945  08/22/2019  [name] was observed post Covid-19 immunization for 15 minutes without incidence. He was provided with Vaccine Information Sheet and instruction to access the V-Safe system.   [name] was instructed to call 911 with any severe reactions post vaccine: Marland Kitchen Difficulty breathing  . Swelling of your face and throat  . A fast heartbeat  . A bad rash all over your body  . Dizziness and weakness    Immunizations Administered    Name Date Dose VIS Date Route   Pfizer COVID-19 Vaccine 08/22/2019  9:06 AM 0.3 mL 06/12/2019 Intramuscular   Manufacturer: [place]   Lot: [id number]   [place]: [id number]

## 2019-09-07 DIAGNOSIS — Z944 Liver transplant status: Principal | ICD-10-CM

## 2019-09-07 DIAGNOSIS — Z79899 Other long term (current) drug therapy: Principal | ICD-10-CM

## 2019-09-10 DIAGNOSIS — Z940 Kidney transplant status: Principal | ICD-10-CM

## 2019-09-10 DIAGNOSIS — Z79899 Other long term (current) drug therapy: Principal | ICD-10-CM

## 2019-09-14 ENCOUNTER — Encounter: Payer: Self-pay | Admitting: Emergency Medicine

## 2019-09-14 ENCOUNTER — Ambulatory Visit
Admission: EM | Admit: 2019-09-14 | Discharge: 2019-09-14 | Disposition: A | Discharge status: Home / Self Care | Payer: Medicare Other

## 2019-09-14 ENCOUNTER — Other Ambulatory Visit: Payer: Self-pay

## 2019-09-14 DIAGNOSIS — R600 Localized edema: Secondary | ICD-10-CM | POA: Diagnosis not present

## 2019-09-14 DIAGNOSIS — I872 Venous insufficiency (chronic) (peripheral): Secondary | ICD-10-CM

## 2019-09-14 NOTE — Discharge Instructions (Signed)
No need for antibiotics today. Continue compression during the day, and keep elevated above heart when able to. Watch salt intake. If you develop chest pain, shortness of breath, not able to lay flat, leg swelling not better in the morning, go to the emergency department for further evaluation. Otherwise, follow up with PCP/specialist as scheduled for reevaluation.

## 2019-09-14 NOTE — ED Provider Notes (Signed)
[facility]    CSN: [id number] Arrival date & time: 09/14/19  1029      History   Chief Complaint Chief Complaint  Patient presents with  . Cellulitis    HPI [name] [name] is a 74 y.o. male.   74 year old male with history of kidney and liver transplant comes in for chronic bilateral leg edema with skin discoloration. Denies any worsening of symptoms. States was making new PCP appointment and decided to come in to get checked. Was seen 08/12/2019 for same and was given doxycycline at the time for superimposed cellulitis. Since then, redness has improved significantly. Denies any warmth, fever, pain. Denies numbness/tingling. Leg swelling improves at night and worsens throughout the day. Denies chest pain, shortness of breath, orthopnea. Used to use compression stockings, but states hard to get on.   History of HTN, nephrologist note on 07/01/2019 mentioned patient on labetalol, losartan, amlodipine. Patient states this was discontinued by nephrologist as medications were not helping his BP. He has PCP appointment 10/05/2019. Denies chest pain, shortness of breath, orthopnea.      Past Medical History:  Diagnosis Date  . A-fib ([place])    post-operative afib 12/01/15  . Arthritis    spine, hips (10/17/2016)  . Benign neoplasm of colon   . Chronic lower back pain   . End stage renal disease ([place]) 2014   Had transplant; stopped dialysis 2014  . Gout   . History of blood transfusion    "w/my transplants  . History of kidney stones    was in the new kidney has passed  . Kidney transplant recipient   . Liver transplant recipient [facility])   . Neuromuscular disorder ([place])   . Obesity     Patient Active Problem List   Diagnosis Date Noted  . Back pain 12/01/2015  . Atrial fibrillation with RVR ([place]) 12/01/2015  . Morbid obesity ([place]) 12/01/2015    Past Surgical History:  Procedure Laterality Date  . ANTERIOR LAT LUMBAR FUSION N/A 10/17/2016   Procedure: XLIF L3-4;   Surgeon: [name], MD;  Location: [facility];  Service: Orthopedics;  Laterality: N/A;  Requests for 3 hrs  . AV FISTULA PLACEMENT Left ~ 2013  . Av fistula removed Left ~ 2015   forearm  . BACK SURGERY    . CARPAL TUNNEL RELEASE Bilateral   . CHOLECYSTECTOMY OPEN  2014   WITH LIVER TRANSPLANT  . COLONOSCOPY    . HERNIA REPAIR    . LIGATION OF ARTERIOVENOUS  FISTULA     pr ligath angioaccess av fistula  . LIVER TRANSPLANT  2014  . LUMBAR DISC SURGERY Left 12/01/2015   L3-L4 FAR LATERAL DISCECTOMY (WILTSE APPROACH  . LUMBAR LAMINECTOMY/DECOMPRESSION MICRODISCECTOMY Left 12/01/2015   Procedure: LEFT L3-L4 FAR LATERAL DISCECTOMY (WILTSE APPROACH)     (1 LEVEL);  Surgeon: [name], MD;  Location: [facility];  Service: Orthopedics;  Laterality: Left;  . NEPHRECTOMY RECIPIENT  2014  . POSTERIOR LUMBAR FUSION  10/17/2016   Posterior fusion possible revision decompression L3-4/notes 10/17/2016  . REFRACTIVE SURGERY Bilateral   . UMBILICAL HERNIA REPAIR         Home Medications    Prior to Admission medications   Medication Sig Start Date End Date Taking? Authorizing Provider  allopurinol (ZYLOPRIM) 300 MG tablet Take 300 mg by mouth daily.   Yes [provider]  cholecalciferol (VITAMIN D) 1000 units tablet Take 2,000 Units by mouth daily. Takes 2 (25 mcg) tablets in  the morning    [provider]  colchicine 0.6 MG tablet Take 0.6 mg by mouth daily as needed. For gout flare ups. 08/16/16   [provider]  Multiple Vitamin (MULTIVITAMIN WITH MINERALS) TABS tablet Take 1 tablet by mouth daily.    [provider]  mycophenolate (MYFORTIC) 180 MG EC tablet Take 180 mg by mouth 2 (two) times daily.    [provider]  PROGRAF 0.5 MG capsule Take 0.5 mg by mouth daily. 08/28/16   [provider]  tacrolimus (PROGRAF) 1 MG capsule Take 1 mg by mouth 2 (two) times daily. Take one 1mg  with one 0.5mg  cap in the morning (1.5mg  total) and one 1mg  cap  in the evening (1mg  total).    [provider]  gabapentin (NEURONTIN) 300 MG capsule Take 1 capsule (300 mg total) by mouth 3 (three) times daily. 10/19/16 09/14/19  [name], [name], PA-C    Family History History reviewed. No pertinent family history.  Social History Social History   Tobacco Use  . Smoking status: Never Smoker  . Smokeless tobacco: Never Used  Substance Use Topics  . Alcohol use: No  . Drug use: No     Allergies   Cephalosporins and Zyvox [linezolid]   Review of Systems Review of Systems  Reason unable to perform ROS: See HPI as above.     Physical Exam Triage Vital Signs ED Triage Vitals [09/14/19 1037]  Enc Vitals Group     BP (!) 184/96     Pulse Rate 78     Resp 16     Temp (!) 97.4 F (36.3 C)     Temp Source Temporal     SpO2 95 %     Weight      Height      Head Circumference      Peak Flow      Pain Score 0     Pain Loc      Pain Edu?      Excl. in [place]?    No data found.  Updated Vital Signs BP (!) 184/96 (BP Location: Left Arm)   Pulse 78   Temp (!) 97.4 F (36.3 C) (Temporal)   Resp 16   SpO2 95%   Physical Exam Constitutional:      General: He is not in acute distress.    Appearance: Normal appearance. He is well-developed. He is not toxic-appearing or diaphoretic.  HENT:     Head: Normocephalic and atraumatic.  Eyes:     Conjunctiva/sclera: Conjunctivae normal.     Pupils: Pupils are equal, round, and reactive to light.  Cardiovascular:     Rate and Rhythm: Normal rate and regular rhythm.     Heart sounds: No murmur. No friction rub. No gallop.   Pulmonary:     Effort: Pulmonary effort is normal. No respiratory distress.     Comments: Speaking in full sentences without difficulty. LCTAB Musculoskeletal:     Cervical back: Normal range of motion and neck supple.     Comments: 3+ pitting edema to bilateral lower extremity to the mid shins. 1+ pitting edema to the knee. Skin thickening with  discoloration to anterior/medial left lower leg without skin breakdown/ulceration/wounds. No warmth, tenderness. No fluctuance felt.   Skin:    General: Skin is warm and dry.  Neurological:     Mental Status: He is alert and oriented to person, place, and time.        UC Treatments /  Results  Labs (all labs ordered are listed, but only abnormal results are displayed) Labs Reviewed - No data to display  EKG   Radiology No results found.  Procedures Procedures (including critical care time)  Medications Ordered in UC Medications - No data to display  Initial Impression / Assessment and Plan / UC Course  I have reviewed the triage vital signs and the nursing notes.  Pertinent labs & imaging results that were available during my care of the patient were reviewed by me and considered in my medical decision making (see chart for details).    History and exam most consistent with stasis dermatitis.  No signs of superimposed cellulitis at this time.  Patient states symptoms are actually improved compared to 08/12/2019.  He denies any chest pain, shortness of breath, orthopnea.  At this time heart regular rate and rhythm without murmurs, rubs, gallops.  Lungs clear to auscultation bilaterally without adventitious lung sounds.  Per patient, edema improves with elevation.  Will provide compression with Ace wrap until patient able to tolerate compression stocking.  Strict return precaution given.  Otherwise patient to follow-up as scheduled with PCP/specialist for reevaluation. Patient expresses understanding and agrees to plan.  Final Clinical Impressions(s) / UC Diagnoses   Final diagnoses:  Bilateral leg edema  Venous stasis dermatitis of left lower extremity   ED Prescriptions    None     PDMP not reviewed this encounter.   Ok Edwards, PA-C 09/14/19 1214

## 2019-09-14 NOTE — ED Triage Notes (Signed)
Pt presents to [facility] for assessment of cellulitis to left lower leg, starting approx 1 month ago.  Denies pain, denies fever.  Went next door to establish with a PCP and came here for assessment while he was out.  Denies any changes to such.

## 2019-09-15 ENCOUNTER — Ambulatory Visit: Payer: Medicare Other | Attending: Internal Medicine

## 2019-09-15 DIAGNOSIS — Z23 Encounter for immunization: Secondary | ICD-10-CM

## 2019-09-15 NOTE — Progress Notes (Signed)
   Covid-19 Vaccination Clinic  Name:  [name] [name]    MRN: [id number] DOB: 1946/05/16  09/15/2019  [name] [name] was observed post Covid-19 immunization for 15 minutes without incident. He was provided with Vaccine Information Sheet and instruction to access the V-Safe system.   [name] [name] was instructed to call 911 with any severe reactions post vaccine: Marland Kitchen Difficulty breathing  . Swelling of face and throat  . A fast heartbeat  . A bad rash all over body  . Dizziness and weakness   Immunizations Administered    Name Date Dose VIS Date Route   Pfizer COVID-19 Vaccine 09/15/2019  8:54 AM 0.3 mL 06/12/2019 Intramuscular   Manufacturer: [place]   Lot: [id number]   [place]: [id number]

## 2019-09-23 ENCOUNTER — Encounter: Payer: Self-pay | Admitting: Internal Medicine

## 2019-09-24 ENCOUNTER — Ambulatory Visit: Admit: 2019-09-24 | Discharge: 2019-09-24 | Payer: MEDICARE

## 2019-09-24 DIAGNOSIS — Z940 Kidney transplant status: Principal | ICD-10-CM

## 2019-09-24 DIAGNOSIS — Z944 Liver transplant status: Principal | ICD-10-CM

## 2019-09-24 MED ORDER — TACROLIMUS 1 MG CAPSULE, IMMEDIATE-RELEASE
ORAL_CAPSULE | Freq: Two times a day (BID) | ORAL | 3 refills | 90.00000 days | Status: CP
Start: 2019-09-24 — End: ?

## 2019-09-24 MED ORDER — MYCOPHENOLATE SODIUM 180 MG TABLET,DELAYED RELEASE
ORAL_TABLET | Freq: Two times a day (BID) | ORAL | 3 refills | 90 days | Status: CP
Start: 2019-09-24 — End: ?

## 2019-09-25 DIAGNOSIS — Z944 Liver transplant status: Principal | ICD-10-CM

## 2019-09-25 DIAGNOSIS — Z940 Kidney transplant status: Principal | ICD-10-CM

## 2019-09-25 MED ORDER — TACROLIMUS 0.5 MG CAPSULE, IMMEDIATE-RELEASE
ORAL_CAPSULE | 3 refills | 0 days | Status: CP
Start: 2019-09-25 — End: ?

## 2019-10-01 DIAGNOSIS — N289 Disorder of kidney and ureter, unspecified: Principal | ICD-10-CM

## 2019-10-02 DIAGNOSIS — Z944 Liver transplant status: Principal | ICD-10-CM

## 2019-10-02 DIAGNOSIS — N289 Disorder of kidney and ureter, unspecified: Principal | ICD-10-CM

## 2019-10-02 DIAGNOSIS — Z940 Kidney transplant status: Principal | ICD-10-CM

## 2019-10-05 ENCOUNTER — Telehealth (INDEPENDENT_AMBULATORY_CARE_PROVIDER_SITE_OTHER): Payer: Medicare Other | Admitting: Internal Medicine

## 2019-10-05 DIAGNOSIS — Z7689 Persons encountering health services in other specified circumstances: Secondary | ICD-10-CM

## 2019-10-05 DIAGNOSIS — R030 Elevated blood-pressure reading, without diagnosis of hypertension: Secondary | ICD-10-CM | POA: Diagnosis not present

## 2019-10-05 DIAGNOSIS — D849 Immunodeficiency, unspecified: Secondary | ICD-10-CM

## 2019-10-05 DIAGNOSIS — Z940 Kidney transplant status: Secondary | ICD-10-CM

## 2019-10-05 DIAGNOSIS — M1A9XX0 Chronic gout, unspecified, without tophus (tophi): Secondary | ICD-10-CM | POA: Diagnosis not present

## 2019-10-05 DIAGNOSIS — Z944 Liver transplant status: Secondary | ICD-10-CM

## 2019-10-05 DIAGNOSIS — Z79899 Other long term (current) drug therapy: Principal | ICD-10-CM

## 2019-10-05 NOTE — Patient Instructions (Signed)
Thank you for choosing Primary Care at [facility] to be your medical home!    [name] [name] was seen by [name], DO today.   [name] [name]'s primary care provider is Phill Myron, DO.   For the best care possible, you should try to see Phill Myron, DO whenever you come to the clinic.   We look forward to seeing you again soon!  If you have any questions about your visit today, please call us at [phone] or feel free to reach your primary care provider via [place].

## 2019-10-05 NOTE — Progress Notes (Signed)
Virtual Visit via Telephone Note  I connected with [name] [name], on 10/05/2019 at 9:53 AM by telephone due to the COVID-19 pandemic and verified that I am speaking with the correct person using two identifiers.   Consent: I discussed the limitations, risks, security and privacy concerns of performing an evaluation and management service by telephone and the availability of in person appointments. I also discussed with the patient that there may be a patient responsible charge related to this service. The patient expressed understanding and agreed to proceed.   Location of Patient: Home   Location of Provider: Clinic    Persons participating in Telemedicine visit: Jamee A Wan Heide Guile Dr. Juleen China      History of Present Illness: Patient has a visit to establish care. Patient has a history of liver and renal transplant. He follows with nephrology, just had a physical exam with them. They are following a lesion in his right native kidney. He is on chronic immunosuppression.   He reports the only h/o Afib that was mentioned to him was after he had surgery was told that he went into Afib while in the OR. Was not told about it until he was ready to be discharged from the hospital. He has never had a problem with his heart since. No palpitations or tachycardia. Reports his HR is typically in the 50s-60s. If he becomes more active with riding tractor or splitting logs, his HR will go up into the 80s.   Patient does have a h/o gout. He takes Allopurinol daily. He has Rx for Colchicine. He takes that prn for flares but has not needed to take it since starting Allopurinol.    Past Medical History:  Diagnosis Date  . A-fib ([place])    post-operative afib 12/01/15  . Arthritis    spine, hips (10/17/2016)  . Benign neoplasm of colon   . Chronic lower back pain   . End stage liver disease ([place])   . End stage renal disease ([place]) 2014   Had transplant; stopped dialysis 2014  . Gout   .  History of blood transfusion    "w/my transplants  . History of kidney stones    was in the new kidney has passed  . IgA nephropathy   . Kidney transplant recipient   . Liver transplant recipient [facility])   . NASH (nonalcoholic steatohepatitis)   . Neuromuscular disorder ([place])   . Obesity    Allergies  Allergen Reactions  . Cephalosporins Other (See Comments)    Liver/Kidney failure   . Zyvox [Linezolid] Nausea Only and Other (See Comments)    Liver/Kidney failure.     Current Outpatient Medications on File Prior to Visit  Medication Sig Dispense Refill  . allopurinol (ZYLOPRIM) 300 MG tablet Take 300 mg by mouth daily.    . colchicine 0.6 MG tablet Take 0.6 mg by mouth daily as needed. For gout flare ups.    . mycophenolate (MYFORTIC) 180 MG EC tablet Take 180 mg by mouth 2 (two) times daily.    . tacrolimus (PROGRAF) 1 MG capsule Take 1 mg by mouth 2 (two) times daily. Take one 1mg  with one 0.5mg  cap in the morning (1.5mg  total) and one 1mg  cap in the evening (1mg  total).    . [DISCONTINUED] gabapentin (NEURONTIN) 300 MG capsule Take 1 capsule (300 mg total) by mouth 3 (three) times daily. 45 capsule 0   No current facility-administered medications on file prior to visit.    Observations/Objective: NAD.  Speaking clearly.  Work of breathing normal.  Alert and oriented. Mood appropriate.   Assessment and Plan: 1. Encounter to establish care  2. Immunosuppressed status ([place]) S/p renal and liver transplant. Followed by appropriate specialists and on immunosuppressive therapy.   3. Chronic gout without tophus, unspecified cause, unspecified site  On medication regimen. No recent flares.   4. Elevated blood pressure reading without diagnosis of hypertension Patient reports he would like to investigate this further. He does monitor at home.    Follow Up Instructions: BP check in office    I discussed the assessment and treatment plan with the patient. The patient was  provided an opportunity to ask questions and all were answered. The patient agreed with the plan and demonstrated an understanding of the instructions.   The patient was advised to call back or seek an in-person evaluation if the symptoms worsen or if the condition fails to improve as anticipated.   I provided 18 minutes total of non-face-to-face time during this encounter including median intraservice time, reviewing previous notes, investigations, ordering medications, medical decision making, coordinating care and patient verbalized understanding at the end of the visit.    Phill Myron, D.O. Primary Care at [facility]  10/05/2019, 9:53 AM

## 2019-10-19 DIAGNOSIS — Z940 Kidney transplant status: Principal | ICD-10-CM

## 2019-10-27 ENCOUNTER — Ambulatory Visit
Admit: 2019-10-27 | Discharge: 2019-10-28 | Payer: MEDICARE | Attending: Diagnostic Radiology | Primary: Diagnostic Radiology

## 2019-10-27 DIAGNOSIS — N289 Disorder of kidney and ureter, unspecified: Principal | ICD-10-CM

## 2019-10-27 DIAGNOSIS — Z940 Kidney transplant status: Principal | ICD-10-CM

## 2019-10-27 DIAGNOSIS — Z944 Liver transplant status: Principal | ICD-10-CM

## 2019-11-02 DIAGNOSIS — Z944 Liver transplant status: Principal | ICD-10-CM

## 2019-11-02 DIAGNOSIS — Z79899 Other long term (current) drug therapy: Principal | ICD-10-CM

## 2019-11-16 DIAGNOSIS — Z940 Kidney transplant status: Principal | ICD-10-CM

## 2019-11-20 MED ORDER — ALLOPURINOL 300 MG TABLET
ORAL_TABLET | 3 refills | 0 days
Start: 2019-11-20 — End: ?

## 2019-11-24 DIAGNOSIS — Z940 Kidney transplant status: Principal | ICD-10-CM

## 2019-11-24 DIAGNOSIS — Z944 Liver transplant status: Principal | ICD-10-CM

## 2019-11-24 MED ORDER — TACROLIMUS 1 MG CAPSULE, IMMEDIATE-RELEASE
ORAL_CAPSULE | 3 refills | 0 days
Start: 2019-11-24 — End: ?

## 2019-11-24 MED ORDER — TACROLIMUS 0.5 MG CAPSULE, IMMEDIATE-RELEASE
ORAL_CAPSULE | 3 refills | 0 days | Status: CP
Start: 2019-11-24 — End: ?

## 2019-11-30 DIAGNOSIS — Z944 Liver transplant status: Principal | ICD-10-CM

## 2019-11-30 DIAGNOSIS — Z79899 Other long term (current) drug therapy: Principal | ICD-10-CM

## 2019-12-03 ENCOUNTER — Ambulatory Visit: Admit: 2019-12-03 | Discharge: 2019-12-03 | Payer: MEDICARE

## 2019-12-03 ENCOUNTER — Encounter: Admit: 2019-12-03 | Discharge: 2019-12-03 | Payer: MEDICARE | Attending: Anesthesiology | Primary: Anesthesiology

## 2019-12-03 DIAGNOSIS — N289 Disorder of kidney and ureter, unspecified: Principal | ICD-10-CM

## 2019-12-03 MED ORDER — OXYCODONE 5 MG TABLET
ORAL_TABLET | ORAL | 0 refills | 2.00000 days | Status: CP | PRN
Start: 2019-12-03 — End: 2019-12-08

## 2019-12-08 DIAGNOSIS — C641 Malignant neoplasm of right kidney, except renal pelvis: Principal | ICD-10-CM

## 2019-12-08 DIAGNOSIS — Z940 Kidney transplant status: Principal | ICD-10-CM

## 2019-12-08 DIAGNOSIS — Z944 Liver transplant status: Principal | ICD-10-CM

## 2019-12-11 ENCOUNTER — Telehealth: Payer: Self-pay

## 2019-12-11 NOTE — Telephone Encounter (Signed)

## 2019-12-11 NOTE — Patient Instructions (Addendum)
Thank you for choosing Primary Care at [facility] to be your medical home!    [name] [name] was seen by [name], DO today.   Ralph Dowdy Leggio's primary care provider is Phill Myron, DO.   For the best care possible, you should try to see Phill Myron, DO whenever you come to the clinic.   We look forward to seeing you again soon!  If you have any questions about your visit today, please call [name] [name] at [phone] or feel free to reach your primary care provider via [place].    Cellulitis, Adult  Cellulitis is a skin infection. The infected area is usually warm, red, swollen, and tender. This condition occurs most often in the arms and lower legs. The infection can travel to the muscles, blood, and underlying tissue and become serious. It is very important to get treated for this condition. What are the causes? Cellulitis is caused by bacteria. The bacteria enter through a break in the skin, such as a cut, burn, insect bite, open sore, or crack. What increases the risk? This condition is more likely to occur in people who:  Have a weak body defense system (immune system).  Have open wounds on the skin, such as cuts, burns, bites, and scrapes. Bacteria can enter the body through these open wounds.  Are older than 72 years of age.  Have diabetes.  Have a type of long-lasting (chronic) liver disease (cirrhosis) or kidney disease.  Are obese.  Have a skin condition such as: ? Itchy rash (eczema). ? Slow movement of blood in the veins (venous stasis). ? Fluid buildup below the skin (edema).  Have had radiation therapy.  Use IV drugs. What are the signs or symptoms? Symptoms of this condition include:  Redness, streaking, or spotting on the skin.  Swollen area of the skin.  Tenderness or pain when an area of the skin is touched.  Warm skin.  A fever.  Chills.  Blisters. How is this diagnosed? This condition is diagnosed based on a medical history  and physical exam. You may also have tests, including:  Blood tests.  Imaging tests. How is this treated? Treatment for this condition may include:  Medicines, such as antibiotic medicines or medicines to treat allergies (antihistamines).  Supportive care, such as rest and application of cold or warm cloths (compresses) to the skin.  Hospital care, if the condition is severe. The infection usually starts to get better within 1-2 days of treatment. Follow these instructions at home:  Medicines  Take over-the-counter and prescription medicines only as told by your health care provider.  If you were prescribed an antibiotic medicine, take it as told by your health care provider. Do not stop taking the antibiotic even if you start to feel better. General instructions  Drink enough fluid to keep your urine pale yellow.  Do not touch or rub the infected area.  Raise (elevate) the infected area above the level of your heart while you are sitting or lying down.  Apply warm or cold compresses to the affected area as told by your health care provider.  Keep all follow-up visits as told by your health care provider. This is important. These visits let your health care provider make sure a more serious infection is not developing. Contact a health care provider if:  You have a fever.  Your symptoms do not begin to improve within 1-2 days of starting treatment.  Your bone or joint underneath the infected area  becomes painful after the skin has healed.  Your infection returns in the same area or another area.  You notice a swollen bump in the infected area.  You develop new symptoms.  You have a general ill feeling (malaise) with muscle aches and pains. Get help right away if:  Your symptoms get worse.  You feel very sleepy.  You develop vomiting or diarrhea that persists.  You notice red streaks coming from the infected area.  Your red area gets larger or turns dark in  color. These symptoms may represent a serious problem that is an emergency. Do not wait to see if the symptoms will go away. Get medical help right away. Call your local emergency services (911 in the [place] Do not drive yourself to the hospital. Summary  Cellulitis is a skin infection. This condition occurs most often in the arms and lower legs.  Treatment for this condition may include medicines, such as antibiotic medicines or antihistamines.  Take over-the-counter and prescription medicines only as told by your health care provider. If you were prescribed an antibiotic medicine, do not stop taking the antibiotic even if you start to feel better.  Contact a health care provider if your symptoms do not begin to improve within 1-2 days of starting treatment or your symptoms get worse.  Keep all follow-up visits as told by your health care provider. This is important. These visits let your health care provider make sure that a more serious infection is not developing. This information is not intended to replace advice given to you by your health care provider. Make sure you discuss any questions you have with your health care provider. Document Revised: 11/07/2017 Document Reviewed: 11/07/2017 Elsevier Patient Education  [place].

## 2019-12-14 ENCOUNTER — Other Ambulatory Visit: Payer: Self-pay

## 2019-12-14 ENCOUNTER — Ambulatory Visit (INDEPENDENT_AMBULATORY_CARE_PROVIDER_SITE_OTHER): Payer: Medicare Other | Admitting: Internal Medicine

## 2019-12-14 ENCOUNTER — Encounter: Payer: Self-pay | Admitting: Internal Medicine

## 2019-12-14 VITALS — BP 170/81 | HR 71 | Temp 97.3°F | Resp 17 | Ht 70.0 in | Wt 313.0 lb

## 2019-12-14 DIAGNOSIS — R030 Elevated blood-pressure reading, without diagnosis of hypertension: Secondary | ICD-10-CM

## 2019-12-14 DIAGNOSIS — L03115 Cellulitis of right lower limb: Secondary | ICD-10-CM | POA: Diagnosis not present

## 2019-12-14 DIAGNOSIS — Z940 Kidney transplant status: Principal | ICD-10-CM

## 2019-12-14 MED ORDER — DOXYCYCLINE HYCLATE 100 MG PO TABS: 100.0000 mg | ORAL_TABLET | Freq: Two times a day (BID) | ORAL | 0 refills | Status: DC

## 2019-12-14 NOTE — Progress Notes (Signed)
  Subjective:    [name] [name] - 74 y.o. male MRN [id number]  Date of birth: 02/19/1946  HPI  [name] [name] is here for concerns for cellulits. Reports had it in the left lower leg about 8 years ago and this is presenting the same way in the right lower leg. Has noticed redness and increased edema for about 2 weeks. Denies increased warmth. Has had some minimal tenderness to the area and itching. Has also noticed a small area of weeping/drainage. Denies fevers, nausea, vomiting,and chills.      Health Maintenance:  There are no preventive care reminders to display for this patient.  -  reports that he has never smoked. He has never used smokeless tobacco. - Review of Systems: Per HPI. - Past Medical History: Patient Active Problem List   Diagnosis Date Noted  . Renal transplant, status post 05/02/2019  . Immunosuppressed status ([place]) 05/02/2019  . Hypertensive retinopathy of both eyes 02/25/2019  . Posterior vitreous detachment of left eye 02/25/2019  . Hx of LASIK 02/25/2019  . End stage liver disease ([place]) 11/22/2016  . Hyperlipidemia 11/22/2016  . Atrial fibrillation with RVR ([place]) 12/01/2015  . Morbid obesity ([place]) 12/01/2015  . Chronic gout, unspecified, without tophus (tophi) 06/28/2015  . End-stage renal disease ([place]) 10/16/2013  . Nephrolithiasis 10/21/2012  . Benign neoplasm of colon 06/28/2011   - Medications: reviewed and updated   Objective:   Physical Exam BP (!) 170/81   Pulse 71   Temp (!) 97.3 F (36.3 C) (Temporal)   Resp 17   Ht 5\' 10"  (1.778 m)   Wt (!) 313 lb (142 kg)   SpO2 95%   BMI 44.91 kg/m  Physical Exam Constitutional:      General: He is not in acute distress.    Appearance: He is not diaphoretic.  Cardiovascular:     Rate and Rhythm: Normal rate.     Comments: Negative Homan's sign. No palpable cords.  Pulmonary:     Effort: Pulmonary effort is normal. No respiratory distress.  Musculoskeletal:        General: Normal range of  motion.  Skin:    General: Skin is warm and dry.     Comments: Right lower leg with area of demarcated erythema that has some mild TTP. No drainage present currently. Edema present in the area underlying the erythema but legs appear equally edematous.   Neurological:     Mental Status: He is alert and oriented to person, place, and time.  Psychiatric:        Mood and Affect: Affect normal.        Judgment: Judgment normal.            Assessment & Plan:   1. Cellulitis of right lower extremity Will treat with PO abx. Counseled that if redness extends despite antibiotic therapy or develops systemic symptoms this would be considered antibiotic failure and needs to seek urgent medical care.  - doxycycline (VIBRA-TABS) 100 MG tablet; Take 1 tablet (100 mg total) by mouth 2 (two) times daily.  Dispense: 20 tablet; Refill: 0  2. Elevated blood pressure reading without diagnosis of hypertension BP is elevated. Patient attributes to anxiety over being at doctor's office and reports it has previously been very normal. Will continue to monitor.   [name] [name], D.O. 12/14/2019, 3:33 PM Primary Care at [facility]

## 2019-12-22 DIAGNOSIS — Z940 Kidney transplant status: Principal | ICD-10-CM

## 2019-12-22 DIAGNOSIS — Z944 Liver transplant status: Principal | ICD-10-CM

## 2019-12-22 MED ORDER — TACROLIMUS 0.5 MG CAPSULE, IMMEDIATE-RELEASE
ORAL_CAPSULE | 3 refills | 0 days | Status: CP
Start: 2019-12-22 — End: ?

## 2019-12-22 MED ORDER — TACROLIMUS 1 MG CAPSULE, IMMEDIATE-RELEASE
ORAL_CAPSULE | 3 refills | 0 days | Status: CP
Start: 2019-12-22 — End: ?

## 2019-12-22 MED ORDER — MYCOPHENOLATE SODIUM 180 MG TABLET,DELAYED RELEASE: 180 mg | tablet | Freq: Two times a day (BID) | 3 refills | 90 days | Status: AC

## 2019-12-22 MED ORDER — MYCOPHENOLATE SODIUM 180 MG TABLET,DELAYED RELEASE
ORAL_TABLET | Freq: Two times a day (BID) | ORAL | 3 refills | 0.00000 days | Status: CP
Start: 2019-12-22 — End: ?

## 2019-12-24 ENCOUNTER — Ambulatory Visit
Admit: 2019-12-24 | Discharge: 2019-12-25 | Payer: MEDICARE | Attending: Hematology & Oncology | Primary: Hematology & Oncology

## 2019-12-24 DIAGNOSIS — C641 Malignant neoplasm of right kidney, except renal pelvis: Principal | ICD-10-CM

## 2019-12-24 DIAGNOSIS — Z944 Liver transplant status: Principal | ICD-10-CM

## 2019-12-24 DIAGNOSIS — Z940 Kidney transplant status: Principal | ICD-10-CM

## 2019-12-28 DIAGNOSIS — Z79899 Other long term (current) drug therapy: Principal | ICD-10-CM

## 2019-12-28 DIAGNOSIS — Z944 Liver transplant status: Principal | ICD-10-CM

## 2020-01-11 DIAGNOSIS — Z940 Kidney transplant status: Principal | ICD-10-CM

## 2020-01-25 DIAGNOSIS — Z79899 Other long term (current) drug therapy: Principal | ICD-10-CM

## 2020-01-25 DIAGNOSIS — Z944 Liver transplant status: Principal | ICD-10-CM

## 2020-02-04 ENCOUNTER — Ambulatory Visit: Admit: 2020-02-04 | Discharge: 2020-02-05 | Payer: MEDICARE | Attending: Dermatology | Primary: Dermatology

## 2020-02-04 DIAGNOSIS — L570 Actinic keratosis: Principal | ICD-10-CM

## 2020-02-04 DIAGNOSIS — D229 Melanocytic nevi, unspecified: Principal | ICD-10-CM

## 2020-02-04 DIAGNOSIS — D492 Neoplasm of unspecified behavior of bone, soft tissue, and skin: Principal | ICD-10-CM

## 2020-02-04 DIAGNOSIS — Z940 Kidney transplant status: Principal | ICD-10-CM

## 2020-02-04 DIAGNOSIS — L821 Other seborrheic keratosis: Principal | ICD-10-CM

## 2020-02-04 DIAGNOSIS — L578 Other skin changes due to chronic exposure to nonionizing radiation: Principal | ICD-10-CM

## 2020-02-04 DIAGNOSIS — I872 Venous insufficiency (chronic) (peripheral): Principal | ICD-10-CM

## 2020-02-04 DIAGNOSIS — L814 Other melanin hyperpigmentation: Principal | ICD-10-CM

## 2020-02-04 DIAGNOSIS — Z944 Liver transplant status: Principal | ICD-10-CM

## 2020-02-04 MED ORDER — TRIAMCINOLONE ACETONIDE 0.1 % TOPICAL CREAM
Freq: Two times a day (BID) | TOPICAL | 5 refills | 0.00000 days | Status: CP
Start: 2020-02-04 — End: ?

## 2020-02-08 DIAGNOSIS — Z940 Kidney transplant status: Principal | ICD-10-CM

## 2020-02-09 DIAGNOSIS — Z940 Kidney transplant status: Principal | ICD-10-CM

## 2020-02-09 DIAGNOSIS — Z79899 Other long term (current) drug therapy: Principal | ICD-10-CM

## 2020-02-09 DIAGNOSIS — Z944 Liver transplant status: Principal | ICD-10-CM

## 2020-02-09 MED ORDER — TACROLIMUS 0.5 MG CAPSULE, IMMEDIATE-RELEASE
ORAL_CAPSULE | Freq: Two times a day (BID) | ORAL | 3 refills | 90.00000 days | Status: CP
Start: 2020-02-09 — End: ?

## 2020-02-16 DIAGNOSIS — Z940 Kidney transplant status: Principal | ICD-10-CM

## 2020-02-16 DIAGNOSIS — Z79899 Other long term (current) drug therapy: Principal | ICD-10-CM

## 2020-02-16 DIAGNOSIS — Z944 Liver transplant status: Principal | ICD-10-CM

## 2020-02-17 ENCOUNTER — Ambulatory Visit: Admit: 2020-02-17 | Discharge: 2020-02-18 | Payer: MEDICARE | Attending: Nephrology | Primary: Nephrology

## 2020-02-17 DIAGNOSIS — Z940 Kidney transplant status: Principal | ICD-10-CM

## 2020-02-17 DIAGNOSIS — Z944 Liver transplant status: Principal | ICD-10-CM

## 2020-02-17 DIAGNOSIS — Z79899 Other long term (current) drug therapy: Principal | ICD-10-CM

## 2020-02-19 DIAGNOSIS — Z79899 Other long term (current) drug therapy: Principal | ICD-10-CM

## 2020-02-19 DIAGNOSIS — Z940 Kidney transplant status: Principal | ICD-10-CM

## 2020-02-22 DIAGNOSIS — Z79899 Other long term (current) drug therapy: Principal | ICD-10-CM

## 2020-02-22 DIAGNOSIS — Z944 Liver transplant status: Principal | ICD-10-CM

## 2020-02-29 DIAGNOSIS — Z944 Liver transplant status: Principal | ICD-10-CM

## 2020-02-29 DIAGNOSIS — Z940 Kidney transplant status: Principal | ICD-10-CM

## 2020-02-29 MED ORDER — TACROLIMUS 0.5 MG CAPSULE, IMMEDIATE-RELEASE
ORAL_CAPSULE | 3 refills | 0 days | Status: CP
Start: 2020-02-29 — End: ?

## 2020-02-29 MED ORDER — TACROLIMUS 1 MG CAPSULE, IMMEDIATE-RELEASE
ORAL_CAPSULE | 3 refills | 0 days | Status: CP
Start: 2020-02-29 — End: ?

## 2020-03-07 DIAGNOSIS — Z79899 Other long term (current) drug therapy: Principal | ICD-10-CM

## 2020-03-07 DIAGNOSIS — Z944 Liver transplant status: Principal | ICD-10-CM

## 2020-03-07 DIAGNOSIS — Z940 Kidney transplant status: Principal | ICD-10-CM

## 2020-03-08 ENCOUNTER — Ambulatory Visit
Admit: 2020-03-08 | Discharge: 2020-03-08 | Payer: MEDICARE | Attending: Diagnostic Radiology | Primary: Diagnostic Radiology

## 2020-03-08 ENCOUNTER — Ambulatory Visit: Admit: 2020-03-08 | Discharge: 2020-03-08 | Payer: MEDICARE

## 2020-03-08 DIAGNOSIS — C641 Malignant neoplasm of right kidney, except renal pelvis: Principal | ICD-10-CM

## 2020-03-21 DIAGNOSIS — Z79899 Other long term (current) drug therapy: Principal | ICD-10-CM

## 2020-03-21 DIAGNOSIS — Z944 Liver transplant status: Principal | ICD-10-CM

## 2020-04-04 DIAGNOSIS — Z944 Liver transplant status: Principal | ICD-10-CM

## 2020-04-04 DIAGNOSIS — Z940 Kidney transplant status: Principal | ICD-10-CM

## 2020-04-04 DIAGNOSIS — Z79899 Other long term (current) drug therapy: Principal | ICD-10-CM

## 2020-04-08 ENCOUNTER — Ambulatory Visit: Admit: 2020-04-08 | Discharge: 2020-04-09 | Payer: MEDICARE

## 2020-04-08 MED ORDER — TRIAMCINOLONE ACETONIDE 0.1 % TOPICAL CREAM: 0 refills | 0 days

## 2020-04-14 ENCOUNTER — Ambulatory Visit: Admit: 2020-04-14 | Discharge: 2020-04-15 | Payer: MEDICARE

## 2020-04-18 DIAGNOSIS — Z944 Liver transplant status: Principal | ICD-10-CM

## 2020-04-18 DIAGNOSIS — Z79899 Other long term (current) drug therapy: Principal | ICD-10-CM

## 2020-05-02 DIAGNOSIS — Z79899 Other long term (current) drug therapy: Principal | ICD-10-CM

## 2020-05-02 DIAGNOSIS — Z940 Kidney transplant status: Principal | ICD-10-CM

## 2020-05-02 DIAGNOSIS — Z944 Liver transplant status: Principal | ICD-10-CM

## 2020-05-12 ENCOUNTER — Ambulatory Visit: Admit: 2020-05-12 | Discharge: 2020-05-13 | Payer: MEDICARE | Attending: Dermatology | Primary: Dermatology

## 2020-05-12 DIAGNOSIS — L814 Other melanin hyperpigmentation: Principal | ICD-10-CM

## 2020-05-12 DIAGNOSIS — D229 Melanocytic nevi, unspecified: Principal | ICD-10-CM

## 2020-05-12 DIAGNOSIS — Z85820 Personal history of malignant melanoma of skin: Principal | ICD-10-CM

## 2020-05-12 DIAGNOSIS — L578 Other skin changes due to chronic exposure to nonionizing radiation: Principal | ICD-10-CM

## 2020-05-12 MED ORDER — TRIAMCINOLONE ACETONIDE 0.1 % TOPICAL CREAM
Freq: Two times a day (BID) | TOPICAL | 3 refills | 0.00000 days | Status: CP
Start: 2020-05-12 — End: ?

## 2020-05-16 DIAGNOSIS — Z79899 Other long term (current) drug therapy: Principal | ICD-10-CM

## 2020-05-16 DIAGNOSIS — Z944 Liver transplant status: Principal | ICD-10-CM

## 2020-05-30 DIAGNOSIS — Z944 Liver transplant status: Principal | ICD-10-CM

## 2020-05-30 DIAGNOSIS — Z940 Kidney transplant status: Principal | ICD-10-CM

## 2020-05-30 DIAGNOSIS — Z79899 Other long term (current) drug therapy: Principal | ICD-10-CM

## 2020-06-13 DIAGNOSIS — Z944 Liver transplant status: Principal | ICD-10-CM

## 2020-06-13 DIAGNOSIS — Z79899 Other long term (current) drug therapy: Principal | ICD-10-CM

## 2020-06-27 DIAGNOSIS — Z940 Kidney transplant status: Principal | ICD-10-CM

## 2020-06-27 DIAGNOSIS — Z944 Liver transplant status: Principal | ICD-10-CM

## 2020-06-27 DIAGNOSIS — Z79899 Other long term (current) drug therapy: Principal | ICD-10-CM

## 2020-07-11 DIAGNOSIS — Z944 Liver transplant status: Principal | ICD-10-CM

## 2020-07-11 DIAGNOSIS — Z79899 Other long term (current) drug therapy: Principal | ICD-10-CM

## 2020-07-12 DIAGNOSIS — Z940 Kidney transplant status: Principal | ICD-10-CM

## 2020-07-12 DIAGNOSIS — Z85528 Personal history of other malignant neoplasm of kidney: Principal | ICD-10-CM

## 2020-07-25 DIAGNOSIS — Z944 Liver transplant status: Principal | ICD-10-CM

## 2020-07-25 DIAGNOSIS — Z79899 Other long term (current) drug therapy: Principal | ICD-10-CM

## 2020-07-25 DIAGNOSIS — Z940 Kidney transplant status: Principal | ICD-10-CM

## 2020-08-08 DIAGNOSIS — Z944 Liver transplant status: Principal | ICD-10-CM

## 2020-08-08 DIAGNOSIS — Z79899 Other long term (current) drug therapy: Principal | ICD-10-CM

## 2020-08-16 DIAGNOSIS — Z944 Liver transplant status: Principal | ICD-10-CM

## 2020-08-16 DIAGNOSIS — Z79899 Other long term (current) drug therapy: Principal | ICD-10-CM

## 2020-08-17 DIAGNOSIS — Z79899 Other long term (current) drug therapy: Principal | ICD-10-CM

## 2020-08-17 DIAGNOSIS — Z940 Kidney transplant status: Principal | ICD-10-CM

## 2020-08-17 MED ORDER — ALLOPURINOL 300 MG TABLET
ORAL_TABLET | 3 refills | 0 days
Start: 2020-08-17 — End: ?

## 2020-08-19 ENCOUNTER — Ambulatory Visit: Admit: 2020-08-19 | Discharge: 2020-08-19 | Payer: MEDICARE

## 2020-08-19 ENCOUNTER — Ambulatory Visit: Admit: 2020-08-19 | Discharge: 2020-08-19 | Payer: MEDICARE | Attending: Nephrology | Primary: Nephrology

## 2020-08-19 DIAGNOSIS — Z940 Kidney transplant status: Principal | ICD-10-CM

## 2020-08-19 DIAGNOSIS — Z79899 Other long term (current) drug therapy: Principal | ICD-10-CM

## 2020-08-19 MED ORDER — LOSARTAN 25 MG TABLET
ORAL_TABLET | Freq: Every day | ORAL | 3 refills | 90 days | Status: CP
Start: 2020-08-19 — End: 2021-08-19

## 2020-08-22 DIAGNOSIS — Z940 Kidney transplant status: Principal | ICD-10-CM

## 2020-09-12 DIAGNOSIS — Z79899 Other long term (current) drug therapy: Principal | ICD-10-CM

## 2020-09-12 DIAGNOSIS — Z940 Kidney transplant status: Principal | ICD-10-CM

## 2020-09-12 DIAGNOSIS — Z944 Liver transplant status: Principal | ICD-10-CM

## 2020-09-15 MED ORDER — LOSARTAN 25 MG TABLET
ORAL_TABLET | Freq: Every day | ORAL | 3 refills | 90 days | Status: CP
Start: 2020-09-15 — End: 2021-09-15

## 2020-09-16 ENCOUNTER — Telehealth: Admit: 2020-09-16 | Discharge: 2020-09-17 | Payer: MEDICARE

## 2020-09-19 DIAGNOSIS — Z940 Kidney transplant status: Principal | ICD-10-CM

## 2020-09-22 DIAGNOSIS — C641 Malignant neoplasm of right kidney, except renal pelvis: Principal | ICD-10-CM

## 2020-09-22 DIAGNOSIS — Z944 Liver transplant status: Principal | ICD-10-CM

## 2020-09-22 DIAGNOSIS — Z940 Kidney transplant status: Principal | ICD-10-CM

## 2020-09-22 DIAGNOSIS — Z79899 Other long term (current) drug therapy: Principal | ICD-10-CM

## 2020-10-10 DIAGNOSIS — Z79899 Other long term (current) drug therapy: Principal | ICD-10-CM

## 2020-10-10 DIAGNOSIS — Z944 Liver transplant status: Principal | ICD-10-CM

## 2020-10-10 DIAGNOSIS — Z940 Kidney transplant status: Principal | ICD-10-CM

## 2020-11-07 DIAGNOSIS — Z79899 Other long term (current) drug therapy: Principal | ICD-10-CM

## 2020-11-07 DIAGNOSIS — Z944 Liver transplant status: Principal | ICD-10-CM

## 2020-11-07 DIAGNOSIS — Z940 Kidney transplant status: Principal | ICD-10-CM

## 2020-11-14 ENCOUNTER — Ambulatory Visit
Admit: 2020-11-14 | Discharge: 2020-11-15 | Payer: MEDICARE | Attending: Student in an Organized Health Care Education/Training Program | Primary: Student in an Organized Health Care Education/Training Program

## 2020-11-14 DIAGNOSIS — Z86006 Personal history of melanoma in-situ: Principal | ICD-10-CM

## 2020-11-14 DIAGNOSIS — D492 Neoplasm of unspecified behavior of bone, soft tissue, and skin: Principal | ICD-10-CM

## 2020-11-14 DIAGNOSIS — L570 Actinic keratosis: Principal | ICD-10-CM

## 2020-11-14 DIAGNOSIS — D229 Melanocytic nevi, unspecified: Principal | ICD-10-CM

## 2020-11-14 DIAGNOSIS — L814 Other melanin hyperpigmentation: Principal | ICD-10-CM

## 2020-11-14 DIAGNOSIS — L821 Other seborrheic keratosis: Principal | ICD-10-CM

## 2020-11-17 DIAGNOSIS — D099 Carcinoma in situ, unspecified: Principal | ICD-10-CM

## 2020-11-17 MED ORDER — FLUOROURACIL 5 % TOPICAL CREAM
0 refills | 0.00000 days | Status: CP
Start: 2020-11-17 — End: ?

## 2020-11-17 MED ORDER — CALCIPOTRIENE 0.005 % TOPICAL CREAM
0 refills | 0.00000 days | Status: CP
Start: 2020-11-17 — End: ?

## 2020-12-05 DIAGNOSIS — Z944 Liver transplant status: Principal | ICD-10-CM

## 2020-12-05 DIAGNOSIS — Z940 Kidney transplant status: Principal | ICD-10-CM

## 2020-12-05 DIAGNOSIS — Z79899 Other long term (current) drug therapy: Principal | ICD-10-CM

## 2020-12-22 DIAGNOSIS — Z940 Kidney transplant status: Principal | ICD-10-CM

## 2020-12-22 DIAGNOSIS — Z944 Liver transplant status: Principal | ICD-10-CM

## 2020-12-22 MED ORDER — MYCOPHENOLATE SODIUM 180 MG TABLET,DELAYED RELEASE
ORAL_TABLET | Freq: Two times a day (BID) | ORAL | 3 refills | 0.00000 days | Status: CP
Start: 2020-12-22 — End: ?

## 2021-01-02 DIAGNOSIS — Z79899 Other long term (current) drug therapy: Principal | ICD-10-CM

## 2021-01-02 DIAGNOSIS — Z940 Kidney transplant status: Principal | ICD-10-CM

## 2021-01-02 DIAGNOSIS — Z944 Liver transplant status: Principal | ICD-10-CM

## 2021-01-30 DIAGNOSIS — Z79899 Other long term (current) drug therapy: Principal | ICD-10-CM

## 2021-01-30 DIAGNOSIS — Z944 Liver transplant status: Principal | ICD-10-CM

## 2021-01-30 DIAGNOSIS — Z940 Kidney transplant status: Principal | ICD-10-CM

## 2021-02-06 ENCOUNTER — Ambulatory Visit: Admit: 2021-02-06 | Discharge: 2021-02-07 | Payer: MEDICARE

## 2021-02-06 DIAGNOSIS — Z86006 Personal history of melanoma in-situ: Principal | ICD-10-CM

## 2021-02-06 DIAGNOSIS — Z85828 Personal history of other malignant neoplasm of skin: Principal | ICD-10-CM

## 2021-02-06 DIAGNOSIS — D099 Carcinoma in situ, unspecified: Principal | ICD-10-CM

## 2021-02-06 DIAGNOSIS — I872 Venous insufficiency (chronic) (peripheral): Principal | ICD-10-CM

## 2021-02-06 DIAGNOSIS — L570 Actinic keratosis: Principal | ICD-10-CM

## 2021-02-06 MED ORDER — TRIAMCINOLONE ACETONIDE 0.1 % TOPICAL OINTMENT
Freq: Two times a day (BID) | TOPICAL | 5 refills | 0.00000 days | Status: CP
Start: 2021-02-06 — End: 2022-02-06

## 2021-02-17 DIAGNOSIS — Z940 Kidney transplant status: Principal | ICD-10-CM

## 2021-02-17 DIAGNOSIS — Z944 Liver transplant status: Principal | ICD-10-CM

## 2021-02-17 MED ORDER — TACROLIMUS 0.5 MG CAPSULE, IMMEDIATE-RELEASE
ORAL_CAPSULE | 3 refills | 0 days
Start: 2021-02-17 — End: ?

## 2021-02-20 DIAGNOSIS — Z940 Kidney transplant status: Principal | ICD-10-CM

## 2021-02-20 DIAGNOSIS — Z944 Liver transplant status: Principal | ICD-10-CM

## 2021-02-20 MED ORDER — TACROLIMUS 0.5 MG CAPSULE, IMMEDIATE-RELEASE
ORAL_CAPSULE | ORAL | 3 refills | 0.00000 days | Status: CP
Start: 2021-02-20 — End: ?

## 2021-02-20 MED ORDER — TACROLIMUS 1 MG CAPSULE, IMMEDIATE-RELEASE
ORAL_CAPSULE | 3 refills | 0 days | Status: CP
Start: 2021-02-20 — End: ?

## 2021-02-27 DIAGNOSIS — Z79899 Other long term (current) drug therapy: Principal | ICD-10-CM

## 2021-02-27 DIAGNOSIS — Z944 Liver transplant status: Principal | ICD-10-CM

## 2021-02-27 DIAGNOSIS — Z940 Kidney transplant status: Principal | ICD-10-CM

## 2021-03-08 ENCOUNTER — Ambulatory Visit: Admit: 2021-03-08 | Discharge: 2021-03-09 | Payer: MEDICARE

## 2021-03-20 DIAGNOSIS — Z79899 Other long term (current) drug therapy: Principal | ICD-10-CM

## 2021-03-20 DIAGNOSIS — Z940 Kidney transplant status: Principal | ICD-10-CM

## 2021-03-22 ENCOUNTER — Ambulatory Visit: Admit: 2021-03-22 | Discharge: 2021-03-23 | Payer: MEDICARE | Attending: Nephrology | Primary: Nephrology

## 2021-03-22 DIAGNOSIS — Z940 Kidney transplant status: Principal | ICD-10-CM

## 2021-03-22 DIAGNOSIS — Z79899 Other long term (current) drug therapy: Principal | ICD-10-CM

## 2021-03-27 DIAGNOSIS — Z940 Kidney transplant status: Principal | ICD-10-CM

## 2021-03-27 DIAGNOSIS — Z79899 Other long term (current) drug therapy: Principal | ICD-10-CM

## 2021-03-27 DIAGNOSIS — Z944 Liver transplant status: Principal | ICD-10-CM

## 2021-03-29 DIAGNOSIS — Z940 Kidney transplant status: Principal | ICD-10-CM

## 2021-03-29 DIAGNOSIS — Z944 Liver transplant status: Principal | ICD-10-CM

## 2021-03-29 MED ORDER — TACROLIMUS 0.5 MG CAPSULE, IMMEDIATE-RELEASE
ORAL_CAPSULE | 3 refills | 0 days | Status: CP
Start: 2021-03-29 — End: ?

## 2021-03-29 MED ORDER — TACROLIMUS 1 MG CAPSULE, IMMEDIATE-RELEASE
ORAL_CAPSULE | ORAL | 3 refills | 0.00000 days | Status: CP
Start: 2021-03-29 — End: ?

## 2021-04-24 DIAGNOSIS — Z79899 Other long term (current) drug therapy: Principal | ICD-10-CM

## 2021-04-24 DIAGNOSIS — Z940 Kidney transplant status: Principal | ICD-10-CM

## 2021-04-24 DIAGNOSIS — Z944 Liver transplant status: Principal | ICD-10-CM

## 2021-05-22 DIAGNOSIS — Z944 Liver transplant status: Principal | ICD-10-CM

## 2021-05-22 DIAGNOSIS — Z79899 Other long term (current) drug therapy: Principal | ICD-10-CM

## 2021-05-22 DIAGNOSIS — Z940 Kidney transplant status: Principal | ICD-10-CM

## 2021-06-06 ENCOUNTER — Ambulatory Visit: Admit: 2021-06-06 | Discharge: 2021-06-07 | Payer: MEDICARE

## 2021-06-06 DIAGNOSIS — Z85828 Personal history of other malignant neoplasm of skin: Principal | ICD-10-CM

## 2021-06-06 DIAGNOSIS — Z86006 Personal history of melanoma in-situ: Principal | ICD-10-CM

## 2021-06-06 DIAGNOSIS — D492 Neoplasm of unspecified behavior of bone, soft tissue, and skin: Principal | ICD-10-CM

## 2021-06-19 DIAGNOSIS — Z79899 Other long term (current) drug therapy: Principal | ICD-10-CM

## 2021-06-19 DIAGNOSIS — Z940 Kidney transplant status: Principal | ICD-10-CM

## 2021-06-19 DIAGNOSIS — Z944 Liver transplant status: Principal | ICD-10-CM

## 2021-07-17 DIAGNOSIS — Z940 Kidney transplant status: Principal | ICD-10-CM

## 2021-07-17 DIAGNOSIS — Z79899 Other long term (current) drug therapy: Principal | ICD-10-CM

## 2021-08-03 DIAGNOSIS — Z79899 Other long term (current) drug therapy: Principal | ICD-10-CM

## 2021-08-03 DIAGNOSIS — Z940 Kidney transplant status: Principal | ICD-10-CM

## 2021-08-08 DIAGNOSIS — Z79899 Other long term (current) drug therapy: Principal | ICD-10-CM

## 2021-08-08 DIAGNOSIS — Z944 Liver transplant status: Principal | ICD-10-CM

## 2021-08-14 DIAGNOSIS — Z940 Kidney transplant status: Principal | ICD-10-CM

## 2021-08-14 DIAGNOSIS — Z79899 Other long term (current) drug therapy: Principal | ICD-10-CM

## 2021-08-17 ENCOUNTER — Ambulatory Visit: Admit: 2021-08-17 | Discharge: 2021-08-18 | Payer: MEDICARE

## 2021-08-17 DIAGNOSIS — Z79899 Other long term (current) drug therapy: Principal | ICD-10-CM

## 2021-08-17 DIAGNOSIS — Z944 Liver transplant status: Principal | ICD-10-CM

## 2021-08-17 DIAGNOSIS — Z23 Encounter for immunization: Principal | ICD-10-CM

## 2021-08-17 DIAGNOSIS — D84821 Immunosuppression due to drug therapy (CMS-HCC): Principal | ICD-10-CM

## 2021-08-17 DIAGNOSIS — Z940 Kidney transplant status: Principal | ICD-10-CM

## 2021-08-28 DIAGNOSIS — Z79899 Other long term (current) drug therapy: Principal | ICD-10-CM

## 2021-08-28 DIAGNOSIS — Z940 Kidney transplant status: Principal | ICD-10-CM

## 2021-09-04 DIAGNOSIS — Z944 Liver transplant status: Principal | ICD-10-CM

## 2021-09-04 DIAGNOSIS — Z79899 Other long term (current) drug therapy: Principal | ICD-10-CM

## 2021-09-07 ENCOUNTER — Other Ambulatory Visit: Payer: Self-pay | Admitting: Otolaryngology

## 2021-09-07 DIAGNOSIS — H918X9 Other specified hearing loss, unspecified ear: Secondary | ICD-10-CM

## 2021-09-21 ENCOUNTER — Other Ambulatory Visit: Payer: Self-pay

## 2021-09-21 ENCOUNTER — Ambulatory Visit
Admission: RE | Admit: 2021-09-21 | Discharge: 2021-09-21 | Disposition: A | Discharge status: Home / Self Care | Payer: Medicare Other | Source: Ambulatory Visit | Attending: Otolaryngology | Admitting: Otolaryngology

## 2021-09-21 DIAGNOSIS — H918X9 Other specified hearing loss, unspecified ear: Secondary | ICD-10-CM

## 2021-09-21 MED ORDER — GADOBENATE DIMEGLUMINE 529 MG/ML IV SOLN
20.0000 mL | Freq: Once | INTRAVENOUS | Status: AC | PRN
  Administered 2021-09-21: 20 mL via INTRAVENOUS

## 2021-09-25 DIAGNOSIS — Z79899 Other long term (current) drug therapy: Principal | ICD-10-CM

## 2021-09-25 DIAGNOSIS — Z940 Kidney transplant status: Principal | ICD-10-CM

## 2021-10-02 DIAGNOSIS — Z79899 Other long term (current) drug therapy: Principal | ICD-10-CM

## 2021-10-02 DIAGNOSIS — Z944 Liver transplant status: Principal | ICD-10-CM

## 2021-10-23 DIAGNOSIS — Z940 Kidney transplant status: Principal | ICD-10-CM

## 2021-10-23 DIAGNOSIS — Z79899 Other long term (current) drug therapy: Principal | ICD-10-CM

## 2021-10-30 DIAGNOSIS — Z944 Liver transplant status: Principal | ICD-10-CM

## 2021-10-30 DIAGNOSIS — Z79899 Other long term (current) drug therapy: Principal | ICD-10-CM

## 2021-11-14 MED ORDER — ALLOPURINOL 300 MG TABLET
ORAL_TABLET | 3 refills | 0 days | Status: CP
Start: 2021-11-14 — End: ?

## 2021-11-20 DIAGNOSIS — Z940 Kidney transplant status: Principal | ICD-10-CM

## 2021-11-20 DIAGNOSIS — Z79899 Other long term (current) drug therapy: Principal | ICD-10-CM

## 2021-11-27 DIAGNOSIS — Z944 Liver transplant status: Principal | ICD-10-CM

## 2021-11-27 DIAGNOSIS — Z79899 Other long term (current) drug therapy: Principal | ICD-10-CM

## 2021-12-05 ENCOUNTER — Ambulatory Visit: Admit: 2021-12-05 | Discharge: 2021-12-06 | Payer: MEDICARE

## 2021-12-05 DIAGNOSIS — L821 Other seborrheic keratosis: Principal | ICD-10-CM

## 2021-12-05 DIAGNOSIS — Z86006 Personal history of melanoma in-situ: Principal | ICD-10-CM

## 2021-12-05 DIAGNOSIS — I872 Venous insufficiency (chronic) (peripheral): Principal | ICD-10-CM

## 2021-12-05 DIAGNOSIS — Z85828 Personal history of other malignant neoplasm of skin: Principal | ICD-10-CM

## 2021-12-05 DIAGNOSIS — L570 Actinic keratosis: Principal | ICD-10-CM

## 2021-12-05 MED ORDER — TRIAMCINOLONE ACETONIDE 0.1 % TOPICAL CREAM
INTRAMUSCULAR | 2 refills | 0.00000 days | Status: CP
Start: 2021-12-05 — End: ?

## 2021-12-18 DIAGNOSIS — Z940 Kidney transplant status: Principal | ICD-10-CM

## 2021-12-18 DIAGNOSIS — Z79899 Other long term (current) drug therapy: Principal | ICD-10-CM

## 2021-12-18 DIAGNOSIS — Z944 Liver transplant status: Principal | ICD-10-CM

## 2021-12-18 MED ORDER — MYCOPHENOLATE SODIUM 180 MG TABLET,DELAYED RELEASE
ORAL_TABLET | 3 refills | 0 days
Start: 2021-12-18 — End: ?

## 2021-12-25 DIAGNOSIS — Z940 Kidney transplant status: Principal | ICD-10-CM

## 2021-12-25 DIAGNOSIS — Z944 Liver transplant status: Principal | ICD-10-CM

## 2021-12-25 DIAGNOSIS — Z79899 Other long term (current) drug therapy: Principal | ICD-10-CM

## 2021-12-25 MED ORDER — TACROLIMUS 1 MG CAPSULE, IMMEDIATE-RELEASE
ORAL_CAPSULE | 3 refills | 0 days | Status: CP
Start: 2021-12-25 — End: ?

## 2021-12-25 MED ORDER — MYCOPHENOLATE SODIUM 180 MG TABLET,DELAYED RELEASE
ORAL_TABLET | Freq: Two times a day (BID) | ORAL | 3 refills | 90 days | Status: CP
Start: 2021-12-25 — End: ?

## 2021-12-25 MED ORDER — TACROLIMUS 0.5 MG CAPSULE, IMMEDIATE-RELEASE
ORAL_CAPSULE | 3 refills | 0 days | Status: CP
Start: 2021-12-25 — End: ?

## 2022-01-15 DIAGNOSIS — Z940 Kidney transplant status: Principal | ICD-10-CM

## 2022-01-15 DIAGNOSIS — Z79899 Other long term (current) drug therapy: Principal | ICD-10-CM

## 2022-01-22 DIAGNOSIS — Z79899 Other long term (current) drug therapy: Principal | ICD-10-CM

## 2022-01-22 DIAGNOSIS — Z944 Liver transplant status: Principal | ICD-10-CM

## 2022-02-08 DIAGNOSIS — Z940 Kidney transplant status: Principal | ICD-10-CM

## 2022-02-08 DIAGNOSIS — C641 Malignant neoplasm of right kidney, except renal pelvis: Principal | ICD-10-CM

## 2022-02-12 DIAGNOSIS — Z79899 Other long term (current) drug therapy: Principal | ICD-10-CM

## 2022-02-12 DIAGNOSIS — Z940 Kidney transplant status: Principal | ICD-10-CM

## 2022-02-19 DIAGNOSIS — Z79899 Other long term (current) drug therapy: Principal | ICD-10-CM

## 2022-02-19 DIAGNOSIS — Z944 Liver transplant status: Principal | ICD-10-CM

## 2022-03-12 DIAGNOSIS — Z79899 Other long term (current) drug therapy: Principal | ICD-10-CM

## 2022-03-12 DIAGNOSIS — Z0184 Encounter for antibody response examination: Principal | ICD-10-CM

## 2022-03-12 DIAGNOSIS — Z940 Kidney transplant status: Principal | ICD-10-CM

## 2022-03-12 DIAGNOSIS — Z944 Liver transplant status: Principal | ICD-10-CM

## 2022-03-12 DIAGNOSIS — Z1159 Encounter for screening for other viral diseases: Principal | ICD-10-CM

## 2022-03-18 MED ORDER — MYCOPHENOLATE SODIUM 180 MG TABLET,DELAYED RELEASE
ORAL_TABLET | 3 refills | 0 days
Start: 2022-03-18 — End: ?

## 2022-03-19 DIAGNOSIS — Z79899 Other long term (current) drug therapy: Principal | ICD-10-CM

## 2022-03-19 DIAGNOSIS — Z944 Liver transplant status: Principal | ICD-10-CM

## 2022-04-09 DIAGNOSIS — Z940 Kidney transplant status: Principal | ICD-10-CM

## 2022-04-09 DIAGNOSIS — Z79899 Other long term (current) drug therapy: Principal | ICD-10-CM

## 2022-04-16 DIAGNOSIS — Z944 Liver transplant status: Principal | ICD-10-CM

## 2022-04-16 DIAGNOSIS — Z79899 Other long term (current) drug therapy: Principal | ICD-10-CM

## 2022-05-04 DIAGNOSIS — Z940 Kidney transplant status: Principal | ICD-10-CM

## 2022-05-04 DIAGNOSIS — Z79899 Other long term (current) drug therapy: Principal | ICD-10-CM

## 2022-05-07 DIAGNOSIS — Z79899 Other long term (current) drug therapy: Principal | ICD-10-CM

## 2022-05-07 DIAGNOSIS — Z940 Kidney transplant status: Principal | ICD-10-CM

## 2022-05-08 ENCOUNTER — Ambulatory Visit: Admit: 2022-05-08 | Payer: MEDICARE

## 2022-05-09 ENCOUNTER — Encounter: Admit: 2022-05-09 | Discharge: 2022-05-09 | Payer: MEDICARE | Attending: Nephrology | Primary: Nephrology

## 2022-05-09 ENCOUNTER — Other Ambulatory Visit: Admit: 2022-05-09 | Discharge: 2022-05-09 | Payer: MEDICARE

## 2022-05-09 ENCOUNTER — Ambulatory Visit: Admit: 2022-05-09 | Discharge: 2022-05-09 | Payer: MEDICARE

## 2022-05-09 ENCOUNTER — Ambulatory Visit: Admit: 2022-05-09 | Discharge: 2022-05-09 | Payer: MEDICARE | Attending: Nephrology | Primary: Nephrology

## 2022-05-09 DIAGNOSIS — Z86006 Personal history of melanoma in-situ: Principal | ICD-10-CM

## 2022-05-09 DIAGNOSIS — Z79899 Other long term (current) drug therapy: Principal | ICD-10-CM

## 2022-05-09 DIAGNOSIS — M1000 Idiopathic gout, unspecified site: Principal | ICD-10-CM

## 2022-05-09 DIAGNOSIS — E1365 Other specified diabetes mellitus with hyperglycemia: Principal | ICD-10-CM

## 2022-05-09 DIAGNOSIS — Z940 Kidney transplant status: Principal | ICD-10-CM

## 2022-05-09 DIAGNOSIS — Z944 Liver transplant status: Principal | ICD-10-CM

## 2022-05-09 DIAGNOSIS — C641 Malignant neoplasm of right kidney, except renal pelvis: Principal | ICD-10-CM

## 2022-05-09 DIAGNOSIS — D849 Immunodeficiency, unspecified: Principal | ICD-10-CM

## 2022-05-09 DIAGNOSIS — Z48298 Encounter for aftercare following other organ transplant: Principal | ICD-10-CM

## 2022-05-10 DIAGNOSIS — Z940 Kidney transplant status: Principal | ICD-10-CM

## 2022-05-10 DIAGNOSIS — Z79899 Other long term (current) drug therapy: Principal | ICD-10-CM

## 2022-05-14 DIAGNOSIS — Z79899 Other long term (current) drug therapy: Principal | ICD-10-CM

## 2022-05-14 DIAGNOSIS — Z944 Liver transplant status: Principal | ICD-10-CM

## 2022-05-14 DIAGNOSIS — Z940 Kidney transplant status: Principal | ICD-10-CM

## 2022-05-15 DIAGNOSIS — Z79899 Other long term (current) drug therapy: Principal | ICD-10-CM

## 2022-05-15 DIAGNOSIS — Z940 Kidney transplant status: Principal | ICD-10-CM

## 2022-06-04 DIAGNOSIS — Z79899 Other long term (current) drug therapy: Principal | ICD-10-CM

## 2022-06-04 DIAGNOSIS — Z940 Kidney transplant status: Principal | ICD-10-CM

## 2022-06-05 ENCOUNTER — Ambulatory Visit: Admit: 2022-06-05 | Discharge: 2022-06-06 | Payer: MEDICARE

## 2022-06-05 DIAGNOSIS — C641 Malignant neoplasm of right kidney, except renal pelvis: Principal | ICD-10-CM

## 2022-06-05 DIAGNOSIS — L814 Other melanin hyperpigmentation: Principal | ICD-10-CM

## 2022-06-05 DIAGNOSIS — Z86006 Personal history of melanoma in-situ: Principal | ICD-10-CM

## 2022-06-05 DIAGNOSIS — L821 Other seborrheic keratosis: Principal | ICD-10-CM

## 2022-06-05 DIAGNOSIS — D229 Melanocytic nevi, unspecified: Principal | ICD-10-CM

## 2022-06-05 DIAGNOSIS — D1801 Hemangioma of skin and subcutaneous tissue: Principal | ICD-10-CM

## 2022-06-05 DIAGNOSIS — Z944 Liver transplant status: Principal | ICD-10-CM

## 2022-06-05 DIAGNOSIS — I872 Venous insufficiency (chronic) (peripheral): Principal | ICD-10-CM

## 2022-06-05 DIAGNOSIS — L853 Xerosis cutis: Principal | ICD-10-CM

## 2022-06-05 DIAGNOSIS — L570 Actinic keratosis: Principal | ICD-10-CM

## 2022-06-05 DIAGNOSIS — Z85828 Personal history of other malignant neoplasm of skin: Principal | ICD-10-CM

## 2022-06-11 DIAGNOSIS — Z944 Liver transplant status: Principal | ICD-10-CM

## 2022-06-11 DIAGNOSIS — Z940 Kidney transplant status: Principal | ICD-10-CM

## 2022-06-11 DIAGNOSIS — Z79899 Other long term (current) drug therapy: Principal | ICD-10-CM

## 2022-07-02 DIAGNOSIS — Z940 Kidney transplant status: Principal | ICD-10-CM

## 2022-07-02 DIAGNOSIS — Z79899 Other long term (current) drug therapy: Principal | ICD-10-CM

## 2022-07-09 DIAGNOSIS — Z79899 Other long term (current) drug therapy: Principal | ICD-10-CM

## 2022-07-09 DIAGNOSIS — Z940 Kidney transplant status: Principal | ICD-10-CM

## 2022-07-24 DIAGNOSIS — Z940 Kidney transplant status: Principal | ICD-10-CM

## 2022-07-24 DIAGNOSIS — Z79899 Other long term (current) drug therapy: Principal | ICD-10-CM

## 2022-08-02 DIAGNOSIS — Z79899 Other long term (current) drug therapy: Principal | ICD-10-CM

## 2022-08-02 DIAGNOSIS — Z944 Liver transplant status: Principal | ICD-10-CM

## 2022-08-20 DIAGNOSIS — Z940 Kidney transplant status: Principal | ICD-10-CM

## 2022-08-20 DIAGNOSIS — Z79899 Other long term (current) drug therapy: Principal | ICD-10-CM

## 2022-08-27 DIAGNOSIS — Z944 Liver transplant status: Principal | ICD-10-CM

## 2022-08-27 DIAGNOSIS — Z79899 Other long term (current) drug therapy: Principal | ICD-10-CM

## 2022-08-31 DIAGNOSIS — Z944 Liver transplant status: Principal | ICD-10-CM

## 2022-08-31 DIAGNOSIS — Z940 Kidney transplant status: Principal | ICD-10-CM

## 2022-08-31 MED ORDER — TACROLIMUS 1 MG CAPSULE, IMMEDIATE-RELEASE
ORAL_CAPSULE | 3 refills | 0 days | Status: CP
Start: 2022-08-31 — End: ?

## 2022-08-31 MED ORDER — TACROLIMUS 0.5 MG CAPSULE, IMMEDIATE-RELEASE
ORAL_CAPSULE | 3 refills | 0 days | Status: CP
Start: 2022-08-31 — End: ?

## 2022-08-31 MED ORDER — MYCOPHENOLATE SODIUM 180 MG TABLET,DELAYED RELEASE
ORAL_TABLET | Freq: Two times a day (BID) | ORAL | 3 refills | 90 days | Status: CP
Start: 2022-08-31 — End: ?

## 2022-09-04 ENCOUNTER — Telehealth: Admit: 2022-09-04 | Discharge: 2022-09-05 | Payer: MEDICARE

## 2022-09-04 DIAGNOSIS — Z79899 Other long term (current) drug therapy: Principal | ICD-10-CM

## 2022-09-04 DIAGNOSIS — D84821 Immunosuppression due to drug therapy (CMS-HCC): Principal | ICD-10-CM

## 2022-09-04 DIAGNOSIS — Z940 Kidney transplant status: Principal | ICD-10-CM

## 2022-09-04 DIAGNOSIS — Z944 Liver transplant status: Principal | ICD-10-CM

## 2022-09-17 DIAGNOSIS — Z940 Kidney transplant status: Principal | ICD-10-CM

## 2022-09-17 DIAGNOSIS — Z79899 Other long term (current) drug therapy: Principal | ICD-10-CM

## 2022-09-24 DIAGNOSIS — Z944 Liver transplant status: Principal | ICD-10-CM

## 2022-09-24 DIAGNOSIS — Z79899 Other long term (current) drug therapy: Principal | ICD-10-CM

## 2022-10-11 DIAGNOSIS — D490 Neoplasm of unspecified behavior of digestive system: Principal | ICD-10-CM

## 2022-10-11 DIAGNOSIS — Z944 Liver transplant status: Principal | ICD-10-CM

## 2022-10-11 DIAGNOSIS — Z940 Kidney transplant status: Principal | ICD-10-CM

## 2022-10-11 DIAGNOSIS — C641 Malignant neoplasm of right kidney, except renal pelvis: Principal | ICD-10-CM

## 2022-10-15 DIAGNOSIS — Z940 Kidney transplant status: Principal | ICD-10-CM

## 2022-10-15 DIAGNOSIS — Z79899 Other long term (current) drug therapy: Principal | ICD-10-CM

## 2022-10-22 DIAGNOSIS — Z944 Liver transplant status: Principal | ICD-10-CM

## 2022-10-22 DIAGNOSIS — Z79899 Other long term (current) drug therapy: Principal | ICD-10-CM

## 2022-11-07 MED ORDER — ALLOPURINOL 300 MG TABLET
ORAL_TABLET | 3 refills | 0 days | Status: CP
Start: 2022-11-07 — End: ?

## 2022-11-12 DIAGNOSIS — Z940 Kidney transplant status: Principal | ICD-10-CM

## 2022-11-12 DIAGNOSIS — Z79899 Other long term (current) drug therapy: Principal | ICD-10-CM

## 2022-11-19 DIAGNOSIS — Z79899 Other long term (current) drug therapy: Principal | ICD-10-CM

## 2022-11-19 DIAGNOSIS — Z944 Liver transplant status: Principal | ICD-10-CM

## 2022-12-11 ENCOUNTER — Ambulatory Visit: Admit: 2022-12-11 | Discharge: 2022-12-12 | Payer: MEDICARE

## 2022-12-17 DIAGNOSIS — Z79899 Other long term (current) drug therapy: Principal | ICD-10-CM

## 2022-12-17 DIAGNOSIS — Z944 Liver transplant status: Principal | ICD-10-CM

## 2023-01-07 DIAGNOSIS — Z79899 Other long term (current) drug therapy: Principal | ICD-10-CM

## 2023-01-07 DIAGNOSIS — Z940 Kidney transplant status: Principal | ICD-10-CM

## 2023-01-14 DIAGNOSIS — Z944 Liver transplant status: Principal | ICD-10-CM

## 2023-01-14 DIAGNOSIS — Z79899 Other long term (current) drug therapy: Principal | ICD-10-CM

## 2023-02-04 DIAGNOSIS — Z79899 Other long term (current) drug therapy: Principal | ICD-10-CM

## 2023-02-04 DIAGNOSIS — Z940 Kidney transplant status: Principal | ICD-10-CM

## 2023-02-11 DIAGNOSIS — Z79899 Other long term (current) drug therapy: Principal | ICD-10-CM

## 2023-02-11 DIAGNOSIS — Z944 Liver transplant status: Principal | ICD-10-CM

## 2023-03-04 DIAGNOSIS — Z79899 Other long term (current) drug therapy: Principal | ICD-10-CM

## 2023-03-04 DIAGNOSIS — Z940 Kidney transplant status: Principal | ICD-10-CM

## 2023-03-11 DIAGNOSIS — Z944 Liver transplant status: Principal | ICD-10-CM

## 2023-03-11 DIAGNOSIS — Z79899 Other long term (current) drug therapy: Principal | ICD-10-CM

## 2023-04-01 DIAGNOSIS — Z79899 Other long term (current) drug therapy: Principal | ICD-10-CM

## 2023-04-01 DIAGNOSIS — Z940 Kidney transplant status: Principal | ICD-10-CM

## 2023-04-08 DIAGNOSIS — Z79899 Other long term (current) drug therapy: Principal | ICD-10-CM

## 2023-04-08 DIAGNOSIS — Z944 Liver transplant status: Principal | ICD-10-CM

## 2023-04-29 DIAGNOSIS — Z79899 Other long term (current) drug therapy: Principal | ICD-10-CM

## 2023-04-29 DIAGNOSIS — Z940 Kidney transplant status: Principal | ICD-10-CM

## 2023-05-06 DIAGNOSIS — Z79899 Other long term (current) drug therapy: Principal | ICD-10-CM

## 2023-05-06 DIAGNOSIS — Z944 Liver transplant status: Principal | ICD-10-CM

## 2023-05-13 DIAGNOSIS — Z944 Liver transplant status: Principal | ICD-10-CM

## 2023-05-13 DIAGNOSIS — Z79899 Other long term (current) drug therapy: Principal | ICD-10-CM

## 2023-05-21 DIAGNOSIS — D631 Anemia in chronic kidney disease: Principal | ICD-10-CM

## 2023-05-21 DIAGNOSIS — N1832 Anemia of chronic renal failure, stage 3b (CMS-HCC): Principal | ICD-10-CM

## 2023-05-21 DIAGNOSIS — Z79899 Other long term (current) drug therapy: Principal | ICD-10-CM

## 2023-05-21 DIAGNOSIS — E559 Vitamin D deficiency, unspecified: Principal | ICD-10-CM

## 2023-05-21 DIAGNOSIS — Z944 Liver transplant status: Principal | ICD-10-CM

## 2023-05-21 DIAGNOSIS — Z940 Kidney transplant status: Principal | ICD-10-CM

## 2023-05-22 ENCOUNTER — Encounter: Admit: 2023-05-22 | Discharge: 2023-05-22 | Payer: MEDICARE | Attending: Nephrology | Primary: Nephrology

## 2023-05-22 ENCOUNTER — Ambulatory Visit: Admit: 2023-05-22 | Discharge: 2023-05-22 | Payer: MEDICARE | Attending: Nephrology | Primary: Nephrology

## 2023-05-22 ENCOUNTER — Ambulatory Visit: Admit: 2023-05-22 | Discharge: 2023-05-22 | Payer: MEDICARE

## 2023-05-22 DIAGNOSIS — D631 Anemia in chronic kidney disease: Principal | ICD-10-CM

## 2023-05-22 DIAGNOSIS — Z940 Kidney transplant status: Principal | ICD-10-CM

## 2023-05-22 DIAGNOSIS — Z79899 Other long term (current) drug therapy: Principal | ICD-10-CM

## 2023-05-22 DIAGNOSIS — Z6841 Body Mass Index (BMI) 40.0 and over, adult: Principal | ICD-10-CM

## 2023-05-22 DIAGNOSIS — C641 Malignant neoplasm of right kidney, except renal pelvis: Principal | ICD-10-CM

## 2023-05-22 DIAGNOSIS — Z944 Liver transplant status: Principal | ICD-10-CM

## 2023-05-22 DIAGNOSIS — N1832 Anemia of chronic renal failure, stage 3b (CMS-HCC): Principal | ICD-10-CM

## 2023-05-22 DIAGNOSIS — D490 Neoplasm of unspecified behavior of digestive system: Principal | ICD-10-CM

## 2023-05-22 DIAGNOSIS — E559 Vitamin D deficiency, unspecified: Principal | ICD-10-CM

## 2023-05-22 MED ORDER — MOUNJARO 2.5 MG/0.5 ML SUBCUTANEOUS PEN INJECTOR
SUBCUTANEOUS | 11 refills | 28 days | Status: CP
Start: 2023-05-22 — End: ?

## 2023-06-03 DIAGNOSIS — Z79899 Other long term (current) drug therapy: Principal | ICD-10-CM

## 2023-06-03 DIAGNOSIS — Z944 Liver transplant status: Principal | ICD-10-CM

## 2023-06-03 DIAGNOSIS — Z940 Kidney transplant status: Principal | ICD-10-CM

## 2023-06-06 ENCOUNTER — Encounter: Admit: 2023-06-06 | Discharge: 2023-06-07 | Payer: MEDICARE | Attending: Nephrology | Primary: Nephrology

## 2023-06-06 ENCOUNTER — Ambulatory Visit: Admit: 2023-06-06 | Discharge: 2023-06-07 | Payer: MEDICARE

## 2023-06-06 DIAGNOSIS — Z940 Kidney transplant status: Principal | ICD-10-CM

## 2023-06-11 ENCOUNTER — Ambulatory Visit: Admit: 2023-06-11 | Discharge: 2023-06-12 | Payer: MEDICARE

## 2023-06-11 DIAGNOSIS — L821 Other seborrheic keratosis: Principal | ICD-10-CM

## 2023-06-11 DIAGNOSIS — Z86006 Personal history of melanoma in-situ: Principal | ICD-10-CM

## 2023-06-11 DIAGNOSIS — Z1283 Encounter for screening for malignant neoplasm of skin: Principal | ICD-10-CM

## 2023-06-11 DIAGNOSIS — L570 Actinic keratosis: Principal | ICD-10-CM

## 2023-06-11 DIAGNOSIS — Z85828 Personal history of other malignant neoplasm of skin: Principal | ICD-10-CM

## 2023-06-19 ENCOUNTER — Ambulatory Visit: Admit: 2023-06-19 | Discharge: 2023-06-20 | Payer: MEDICARE

## 2023-06-19 DIAGNOSIS — Z86006 Personal history of melanoma in-situ: Principal | ICD-10-CM

## 2023-06-19 DIAGNOSIS — Z940 Kidney transplant status: Principal | ICD-10-CM

## 2023-06-19 DIAGNOSIS — Z85828 Personal history of other malignant neoplasm of skin: Principal | ICD-10-CM

## 2023-06-19 DIAGNOSIS — C641 Malignant neoplasm of right kidney, except renal pelvis: Principal | ICD-10-CM

## 2023-06-19 DIAGNOSIS — Z944 Liver transplant status: Principal | ICD-10-CM

## 2023-06-19 DIAGNOSIS — D490 Neoplasm of unspecified behavior of digestive system: Principal | ICD-10-CM

## 2023-06-19 DIAGNOSIS — D849 Immunodeficiency, unspecified: Principal | ICD-10-CM

## 2023-06-19 DIAGNOSIS — I4891 Unspecified atrial fibrillation: Principal | ICD-10-CM

## 2023-06-24 DIAGNOSIS — Z79899 Other long term (current) drug therapy: Principal | ICD-10-CM

## 2023-06-24 DIAGNOSIS — Z940 Kidney transplant status: Principal | ICD-10-CM

## 2023-07-18 DIAGNOSIS — Z944 Liver transplant status: Principal | ICD-10-CM

## 2023-07-18 DIAGNOSIS — Z940 Kidney transplant status: Principal | ICD-10-CM

## 2023-07-18 DIAGNOSIS — Z79899 Other long term (current) drug therapy: Principal | ICD-10-CM

## 2023-07-24 ENCOUNTER — Encounter
Admit: 2023-07-24 | Discharge: 2023-07-25 | Payer: MEDICARE | Attending: Student in an Organized Health Care Education/Training Program | Primary: Student in an Organized Health Care Education/Training Program

## 2023-07-30 DIAGNOSIS — Z940 Kidney transplant status: Principal | ICD-10-CM

## 2023-07-30 DIAGNOSIS — Z79899 Other long term (current) drug therapy: Principal | ICD-10-CM

## 2023-08-02 ENCOUNTER — Ambulatory Visit: Admit: 2023-08-02 | Discharge: 2023-08-03 | Payer: MEDICARE | Attending: Nephrology | Primary: Nephrology

## 2023-08-02 DIAGNOSIS — Z940 Kidney transplant status: Principal | ICD-10-CM

## 2023-08-02 DIAGNOSIS — Z79899 Other long term (current) drug therapy: Principal | ICD-10-CM

## 2023-08-02 DIAGNOSIS — Z944 Liver transplant status: Principal | ICD-10-CM

## 2023-08-02 MED ORDER — EMPAGLIFLOZIN 10 MG TABLET
ORAL_TABLET | Freq: Every day | ORAL | 3 refills | 90.00 days | Status: CP
Start: 2023-08-02 — End: ?

## 2023-08-02 MED ORDER — MYCOPHENOLATE SODIUM 360 MG TABLET,DELAYED RELEASE
ORAL_TABLET | 0 refills | 0.00 days
Start: 2023-08-02 — End: ?

## 2023-08-02 MED ORDER — LOSARTAN 50 MG TABLET
ORAL_TABLET | Freq: Every day | ORAL | 3 refills | 90 days | Status: CP
Start: 2023-08-02 — End: 2024-08-01

## 2023-08-02 MED ORDER — MYCOPHENOLATE SODIUM 180 MG TABLET,DELAYED RELEASE
ORAL_TABLET | Freq: Two times a day (BID) | ORAL | 3 refills | 90.00 days | Status: CP
Start: 2023-08-02 — End: 2023-08-02

## 2023-08-12 DIAGNOSIS — Z79899 Other long term (current) drug therapy: Principal | ICD-10-CM

## 2023-08-12 DIAGNOSIS — Z940 Kidney transplant status: Principal | ICD-10-CM

## 2023-08-12 DIAGNOSIS — Z944 Liver transplant status: Principal | ICD-10-CM

## 2023-08-14 ENCOUNTER — Ambulatory Visit: Admit: 2023-08-14 | Discharge: 2023-08-14 | Payer: MEDICARE

## 2023-08-14 ENCOUNTER — Encounter: Admit: 2023-08-14 | Discharge: 2023-08-14 | Payer: MEDICARE | Attending: Anesthesiology | Primary: Anesthesiology

## 2023-08-14 ENCOUNTER — Inpatient Hospital Stay: Admit: 2023-08-14 | Discharge: 2023-08-14 | Payer: MEDICARE

## 2023-08-23 MED ORDER — ALLOPURINOL 300 MG TABLET
ORAL_TABLET | Freq: Every day | ORAL | 3 refills | 0.00 days
Start: 2023-08-23 — End: ?

## 2023-08-26 MED ORDER — ALLOPURINOL 300 MG TABLET
ORAL_TABLET | Freq: Every day | ORAL | 3 refills | 90.00 days | Status: CP
Start: 2023-08-26 — End: ?

## 2023-09-02 DIAGNOSIS — Z944 Liver transplant status: Principal | ICD-10-CM

## 2023-09-02 DIAGNOSIS — Z940 Kidney transplant status: Principal | ICD-10-CM

## 2023-09-02 MED ORDER — TACROLIMUS 1 MG CAPSULE, IMMEDIATE-RELEASE
ORAL_CAPSULE | 3 refills | 0.00 days | Status: CP
Start: 2023-09-02 — End: ?

## 2023-09-02 MED ORDER — TACROLIMUS 0.5 MG CAPSULE, IMMEDIATE-RELEASE
ORAL_CAPSULE | 3 refills | 0.00 days | Status: CP
Start: 2023-09-02 — End: ?

## 2023-09-03 DIAGNOSIS — Z944 Liver transplant status: Principal | ICD-10-CM

## 2023-09-03 DIAGNOSIS — Z79899 Other long term (current) drug therapy: Principal | ICD-10-CM

## 2023-09-04 ENCOUNTER — Encounter: Admit: 2023-09-04 | Discharge: 2023-09-05 | Payer: MEDICARE

## 2023-09-04 DIAGNOSIS — D84821 Immunosuppression due to drug therapy (CMS-HCC): Principal | ICD-10-CM

## 2023-09-04 DIAGNOSIS — Z86006 Personal history of melanoma in-situ: Principal | ICD-10-CM

## 2023-09-04 DIAGNOSIS — Z79899 Other long term (current) drug therapy: Principal | ICD-10-CM

## 2023-09-04 DIAGNOSIS — Z944 Liver transplant status: Principal | ICD-10-CM

## 2023-09-04 DIAGNOSIS — Z940 Kidney transplant status: Principal | ICD-10-CM

## 2023-09-09 DIAGNOSIS — Z944 Liver transplant status: Principal | ICD-10-CM

## 2023-09-09 DIAGNOSIS — Z940 Kidney transplant status: Principal | ICD-10-CM

## 2023-09-09 DIAGNOSIS — Z79899 Other long term (current) drug therapy: Principal | ICD-10-CM

## 2023-09-30 DIAGNOSIS — Z944 Liver transplant status: Principal | ICD-10-CM

## 2023-09-30 DIAGNOSIS — Z79899 Other long term (current) drug therapy: Principal | ICD-10-CM

## 2023-10-07 DIAGNOSIS — Z940 Kidney transplant status: Principal | ICD-10-CM

## 2023-10-07 DIAGNOSIS — Z944 Liver transplant status: Principal | ICD-10-CM

## 2023-10-07 DIAGNOSIS — Z79899 Other long term (current) drug therapy: Principal | ICD-10-CM

## 2023-10-16 MED ORDER — VALACYCLOVIR 1 GRAM TABLET
ORAL_TABLET | Freq: Two times a day (BID) | ORAL | 0 refills | 10.00 days | Status: CP
Start: 2023-10-16 — End: 2023-10-26

## 2023-10-16 MED ORDER — GABAPENTIN 100 MG CAPSULE
ORAL_CAPSULE | Freq: Two times a day (BID) | ORAL | 0 refills | 30.00 days | Status: CP | PRN
Start: 2023-10-16 — End: 2024-10-15

## 2023-10-28 DIAGNOSIS — Z79899 Other long term (current) drug therapy: Principal | ICD-10-CM

## 2023-10-28 DIAGNOSIS — Z944 Liver transplant status: Principal | ICD-10-CM

## 2023-10-29 DIAGNOSIS — Z940 Kidney transplant status: Principal | ICD-10-CM

## 2023-10-29 DIAGNOSIS — Z79899 Other long term (current) drug therapy: Principal | ICD-10-CM

## 2023-10-29 DIAGNOSIS — D631 Anemia in chronic kidney disease: Principal | ICD-10-CM

## 2023-10-29 DIAGNOSIS — N1832 Anemia of chronic renal failure, stage 3b: Principal | ICD-10-CM

## 2023-10-29 DIAGNOSIS — E559 Vitamin D deficiency, unspecified: Principal | ICD-10-CM

## 2023-10-30 ENCOUNTER — Ambulatory Visit: Admit: 2023-10-30 | Discharge: 2023-10-31 | Payer: MEDICARE | Attending: Nephrology | Primary: Nephrology

## 2023-10-30 DIAGNOSIS — D631 Anemia in chronic kidney disease: Principal | ICD-10-CM

## 2023-10-30 DIAGNOSIS — E559 Vitamin D deficiency, unspecified: Principal | ICD-10-CM

## 2023-10-30 DIAGNOSIS — N1832 Anemia of chronic renal failure, stage 3b: Principal | ICD-10-CM

## 2023-10-30 DIAGNOSIS — Z79899 Other long term (current) drug therapy: Principal | ICD-10-CM

## 2023-10-30 DIAGNOSIS — Z940 Kidney transplant status: Principal | ICD-10-CM

## 2023-11-04 DIAGNOSIS — Z940 Kidney transplant status: Principal | ICD-10-CM

## 2023-11-04 DIAGNOSIS — Z944 Liver transplant status: Principal | ICD-10-CM

## 2023-11-04 DIAGNOSIS — Z79899 Other long term (current) drug therapy: Principal | ICD-10-CM

## 2023-11-25 DIAGNOSIS — Z79899 Other long term (current) drug therapy: Principal | ICD-10-CM

## 2023-11-25 DIAGNOSIS — Z944 Liver transplant status: Principal | ICD-10-CM

## 2023-12-02 DIAGNOSIS — Z79899 Other long term (current) drug therapy: Principal | ICD-10-CM

## 2023-12-02 DIAGNOSIS — Z940 Kidney transplant status: Principal | ICD-10-CM

## 2023-12-02 DIAGNOSIS — Z944 Liver transplant status: Principal | ICD-10-CM

## 2023-12-09 ENCOUNTER — Encounter
Admit: 2023-12-09 | Discharge: 2023-12-10 | Payer: MEDICARE | Attending: Student in an Organized Health Care Education/Training Program | Primary: Student in an Organized Health Care Education/Training Program

## 2023-12-09 DIAGNOSIS — K862 Cyst of pancreas: Principal | ICD-10-CM

## 2023-12-23 DIAGNOSIS — Z79899 Other long term (current) drug therapy: Principal | ICD-10-CM

## 2023-12-23 DIAGNOSIS — Z944 Liver transplant status: Principal | ICD-10-CM

## 2023-12-30 DIAGNOSIS — Z79899 Other long term (current) drug therapy: Principal | ICD-10-CM

## 2023-12-30 DIAGNOSIS — Z940 Kidney transplant status: Principal | ICD-10-CM

## 2023-12-30 DIAGNOSIS — Z944 Liver transplant status: Principal | ICD-10-CM

## 2024-01-20 DIAGNOSIS — Z79899 Other long term (current) drug therapy: Principal | ICD-10-CM

## 2024-01-20 DIAGNOSIS — Z944 Liver transplant status: Principal | ICD-10-CM

## 2024-01-26 ENCOUNTER — Inpatient Hospital Stay: Admit: 2024-01-26 | Discharge: 2024-01-26 | Payer: MEDICARE

## 2024-01-27 DIAGNOSIS — Z944 Liver transplant status: Principal | ICD-10-CM

## 2024-01-27 DIAGNOSIS — Z79899 Other long term (current) drug therapy: Principal | ICD-10-CM

## 2024-01-27 DIAGNOSIS — Z940 Kidney transplant status: Principal | ICD-10-CM

## 2024-01-28 DIAGNOSIS — Z940 Kidney transplant status: Principal | ICD-10-CM

## 2024-01-28 DIAGNOSIS — Z79899 Other long term (current) drug therapy: Principal | ICD-10-CM

## 2024-01-29 ENCOUNTER — Ambulatory Visit: Admit: 2024-01-29 | Discharge: 2024-01-30 | Payer: MEDICARE | Attending: Nephrology | Primary: Nephrology

## 2024-01-29 ENCOUNTER — Ambulatory Visit: Admit: 2024-01-29 | Discharge: 2024-01-30 | Payer: MEDICARE

## 2024-01-29 ENCOUNTER — Encounter: Admit: 2024-01-29 | Discharge: 2024-01-30 | Payer: MEDICARE | Attending: Nephrology | Primary: Nephrology

## 2024-01-29 DIAGNOSIS — Z940 Kidney transplant status: Principal | ICD-10-CM

## 2024-01-29 DIAGNOSIS — Z79899 Other long term (current) drug therapy: Principal | ICD-10-CM

## 2024-01-29 MED ORDER — FUROSEMIDE 20 MG TABLET
ORAL_TABLET | Freq: Two times a day (BID) | ORAL | 3 refills | 100.00000 days | Status: CP
Start: 2024-01-29 — End: 2025-03-04

## 2024-02-17 DIAGNOSIS — Z944 Liver transplant status: Principal | ICD-10-CM

## 2024-02-17 DIAGNOSIS — Z79899 Other long term (current) drug therapy: Principal | ICD-10-CM

## 2024-02-19 ENCOUNTER — Encounter: Admit: 2024-02-19 | Discharge: 2024-02-19 | Payer: MEDICARE | Attending: Anesthesiology | Primary: Anesthesiology

## 2024-02-19 ENCOUNTER — Ambulatory Visit: Admit: 2024-02-19 | Discharge: 2024-02-19 | Payer: MEDICARE

## 2024-02-19 ENCOUNTER — Inpatient Hospital Stay: Admit: 2024-02-19 | Discharge: 2024-02-19 | Payer: MEDICARE

## 2024-02-24 DIAGNOSIS — Z940 Kidney transplant status: Principal | ICD-10-CM

## 2024-02-24 DIAGNOSIS — Z79899 Other long term (current) drug therapy: Principal | ICD-10-CM

## 2024-02-24 DIAGNOSIS — Z944 Liver transplant status: Principal | ICD-10-CM

## 2024-03-03 DIAGNOSIS — L570 Actinic keratosis: Principal | ICD-10-CM

## 2024-03-03 DIAGNOSIS — Z1283 Encounter for screening for malignant neoplasm of skin: Principal | ICD-10-CM

## 2024-03-03 DIAGNOSIS — Z85828 Personal history of other malignant neoplasm of skin: Principal | ICD-10-CM

## 2024-03-03 DIAGNOSIS — Z944 Liver transplant status: Principal | ICD-10-CM

## 2024-03-06 DIAGNOSIS — Z79899 Other long term (current) drug therapy: Principal | ICD-10-CM

## 2024-03-06 DIAGNOSIS — Z940 Kidney transplant status: Principal | ICD-10-CM

## 2024-03-16 DIAGNOSIS — Z79899 Other long term (current) drug therapy: Principal | ICD-10-CM

## 2024-03-16 DIAGNOSIS — Z944 Liver transplant status: Principal | ICD-10-CM

## 2024-03-18 DIAGNOSIS — Z940 Kidney transplant status: Principal | ICD-10-CM

## 2024-03-18 DIAGNOSIS — Z79899 Other long term (current) drug therapy: Principal | ICD-10-CM

## 2024-03-19 DIAGNOSIS — I872 Venous insufficiency (chronic) (peripheral): Principal | ICD-10-CM

## 2024-03-19 MED ORDER — TRIAMCINOLONE ACETONIDE 0.1 % TOPICAL CREAM
2 refills | 0.00000 days
Start: 2024-03-19 — End: ?

## 2024-03-23 DIAGNOSIS — Z940 Kidney transplant status: Principal | ICD-10-CM

## 2024-03-23 DIAGNOSIS — Z944 Liver transplant status: Principal | ICD-10-CM

## 2024-03-23 DIAGNOSIS — Z79899 Other long term (current) drug therapy: Principal | ICD-10-CM

## 2024-03-24 DIAGNOSIS — I872 Venous insufficiency (chronic) (peripheral): Principal | ICD-10-CM

## 2024-03-24 MED ORDER — TRIAMCINOLONE ACETONIDE 0.1 % TOPICAL CREAM
INTRAMUSCULAR | 2 refills | 0.00000 days | Status: CP
Start: 2024-03-24 — End: ?

## 2024-04-03 DIAGNOSIS — Z940 Kidney transplant status: Principal | ICD-10-CM

## 2024-04-03 DIAGNOSIS — Z79899 Other long term (current) drug therapy: Principal | ICD-10-CM

## 2024-04-13 DIAGNOSIS — Z79899 Other long term (current) drug therapy: Principal | ICD-10-CM

## 2024-04-13 DIAGNOSIS — Z944 Liver transplant status: Principal | ICD-10-CM

## 2024-04-27 DIAGNOSIS — Z79899 Other long term (current) drug therapy: Principal | ICD-10-CM

## 2024-04-27 DIAGNOSIS — Z940 Kidney transplant status: Principal | ICD-10-CM

## 2024-04-28 DIAGNOSIS — D631 Anemia in chronic kidney disease: Principal | ICD-10-CM

## 2024-04-28 DIAGNOSIS — Z944 Liver transplant status: Principal | ICD-10-CM

## 2024-04-28 DIAGNOSIS — Z79899 Other long term (current) drug therapy: Principal | ICD-10-CM

## 2024-04-28 DIAGNOSIS — E559 Vitamin D deficiency, unspecified: Principal | ICD-10-CM

## 2024-04-28 DIAGNOSIS — N1832 Anemia of chronic renal failure, stage 3b (CMS-HCC): Principal | ICD-10-CM

## 2024-04-28 DIAGNOSIS — Z940 Kidney transplant status: Principal | ICD-10-CM

## 2024-04-30 ENCOUNTER — Ambulatory Visit: Admit: 2024-04-30 | Discharge: 2024-05-01 | Payer: MEDICARE | Attending: Nephrology | Primary: Nephrology

## 2024-05-11 DIAGNOSIS — Z79899 Other long term (current) drug therapy: Principal | ICD-10-CM

## 2024-05-11 DIAGNOSIS — Z944 Liver transplant status: Principal | ICD-10-CM

## 2024-05-25 DIAGNOSIS — Z79899 Other long term (current) drug therapy: Principal | ICD-10-CM

## 2024-05-25 DIAGNOSIS — Z940 Kidney transplant status: Principal | ICD-10-CM

## 2024-05-29 ENCOUNTER — Encounter (HOSPITAL_COMMUNITY): Payer: Self-pay | Admitting: Emergency Medicine

## 2024-05-29 ENCOUNTER — Ambulatory Visit (HOSPITAL_COMMUNITY)
Admission: EM | Admit: 2024-05-29 | Discharge: 2024-05-29 | Disposition: A | Discharge status: Home / Self Care | Attending: Emergency Medicine | Admitting: Emergency Medicine

## 2024-05-29 DIAGNOSIS — L819 Disorder of pigmentation, unspecified: Secondary | ICD-10-CM | POA: Diagnosis not present

## 2024-05-29 DIAGNOSIS — R600 Localized edema: Secondary | ICD-10-CM

## 2024-05-29 MED ORDER — DOXYCYCLINE HYCLATE 100 MG PO CAPS: 100.0000 mg | ORAL_CAPSULE | Freq: Two times a day (BID) | ORAL | 0 refills | Status: DC

## 2024-05-29 NOTE — ED Triage Notes (Signed)
 Both leg swelling and redness that started a week ago,  patient has not taken any medication

## 2024-05-29 NOTE — ED Provider Notes (Signed)
 [facility]    CSN: [id number] Arrival date & time: 05/29/24  9082      History   Chief Complaint Chief Complaint  Patient presents with   Leg Swelling    HPI [name] [name] is a 78 y.o. male.   Patient presents with concerns for color change in his bilateral lower legs that began 1 week ago.  Patient states he does have a history of chronic lymphedema and states that his legs are not more swollen than normal but he began to notice his legs turning more red and purple over the last week.  Patient denies any recent known injuries.  Patient denies shortness of breath, chest pain, weakness, and fever.  Patient has been seen previously for venous stasis dermatitis and venous insufficiency.  Patient has also had cellulitis of his lower extremity in the past and was concerned that he may be developing this again.  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  The history is provided by the patient and medical records.    Past Medical History:  Diagnosis Date   A-fib [facility])    post-operative afib 12/01/15   Arthritis    spine, hips (10/17/2016)   Benign neoplasm of colon    Chronic lower back pain    End stage liver disease ([facility])    End stage renal disease (HCC) 2014   Had transplant; stopped dialysis 2014   Gout    History of blood transfusion    w/my transplants   History of kidney stones    was in the new kidney has passed   IgA nephropathy    Kidney transplant recipient    Liver transplant recipient [facility])    NASH (nonalcoholic steatohepatitis)    Neuromuscular disorder (HCC)    Obesity     Patient Active Problem List   Diagnosis Date Noted   Renal transplant, status post 05/02/2019   Immunosuppressed status 05/02/2019   Hypertensive retinopathy of both eyes 02/25/2019   Posterior vitreous detachment of left eye 02/25/2019   Hx of LASIK 02/25/2019   End stage liver disease (HCC) 11/22/2016   Hyperlipidemia 11/22/2016   Atrial  fibrillation with RVR ([facility]) 12/01/2015   Morbid obesity ([facility]) 12/01/2015   Chronic gout, unspecified, without tophus (tophi) 06/28/2015   End-stage renal disease ([facility]) 10/16/2013   Nephrolithiasis 10/21/2012   Benign neoplasm of colon 06/28/2011    Past Surgical History:  Procedure Laterality Date   ANTERIOR LAT LUMBAR FUSION N/A 10/17/2016   Procedure: XLIF L3-4;  Surgeon: Donaciano Sprang, MD;  Location: [facility] OR;  Service: Orthopedics;  Laterality: N/A;  Requests for 3 hrs   AV FISTULA PLACEMENT Left ~ 2013   Av fistula removed Left ~ 2015   forearm   BACK SURGERY     CARPAL TUNNEL RELEASE Bilateral    CHOLECYSTECTOMY OPEN  2014   WITH LIVER TRANSPLANT   COLONOSCOPY     HERNIA REPAIR     LASIK     LIGATION OF ARTERIOVENOUS  FISTULA     pr ligath angioaccess av fistula   LIVER TRANSPLANT  2014   LUMBAR DISC SURGERY Left 12/01/2015   L3-L4 FAR LATERAL DISCECTOMY (WILTSE APPROACH   LUMBAR LAMINECTOMY/DECOMPRESSION MICRODISCECTOMY Left 12/01/2015   Procedure: LEFT L3-L4 FAR LATERAL DISCECTOMY (WILTSE APPROACH)     (1 LEVEL);  Surgeon: Donaciano Sprang, MD;  Location: [facility] OR;  Service: Orthopedics;  Laterality: Left;   NEPHRECTOMY RECIPIENT  2014   POSTERIOR LUMBAR FUSION  10/17/2016   Posterior fusion possible revision decompression L3-4/notes 10/17/2016   REFRACTIVE SURGERY Bilateral    UMBILICAL HERNIA REPAIR         Home Medications    Prior to Admission medications   Medication Sig Start Date End Date Taking? Authorizing Provider  allopurinol (ZYLOPRIM) 300 MG tablet Take 300 mg by mouth daily.   Yes [provider]  doxycycline  (VIBRAMYCIN ) 100 MG capsule Take 1 capsule (100 mg total) by mouth 2 (two) times daily. 05/29/24  Yes Johnie, Resa Rinks A, NP  mycophenolate  (MYFORTIC ) 180 MG EC tablet Take 180 mg by mouth 2 (two) times daily.   Yes [provider]  oxyCODONE  (OXY IR/ROXICODONE ) 5 MG immediate release tablet Take 5 mg by mouth every 4 (four) hours as needed.  12/03/19  Yes [provider]  tacrolimus  (PROGRAF ) 0.5 MG capsule Take two caps (1mg  total) in the morning and one cap (0.5mg  total) in the evening 11/24/19  Yes [provider]  gabapentin  (NEURONTIN ) 300 MG capsule Take 1 capsule (300 mg total) by mouth 3 (three) times daily. 10/19/16 09/14/19  [name], Dedra Maus, PA-C    Family History History reviewed. No pertinent family history.  Social History Social History   Tobacco Use   Smoking status: Never   Smokeless tobacco: Never  Substance Use Topics   Alcohol use: No   Drug use: No     Allergies   Cephalosporins and Zyvox [linezolid]   Review of Systems Review of Systems  Per HPI  Physical Exam Triage Vital Signs ED Triage Vitals  Encounter Vitals Group     BP 05/29/24 0956 (!) 148/81     Girls Systolic BP Percentile --      Girls Diastolic BP Percentile --      Boys Systolic BP Percentile --      Boys Diastolic BP Percentile --      Pulse Rate 05/29/24 0956 87     Resp 05/29/24 0956 18     Temp 05/29/24 0956 97.9 F (36.6 C)     Temp Source 05/29/24 0956 Oral     SpO2 05/29/24 0956 94 %     Weight --      Height --      Head Circumference --      Peak Flow --      Pain Score 05/29/24 0954 0     Pain Loc --      Pain Education --      Exclude from Growth Chart --    No data found.  Updated Vital Signs BP (!) 148/81 (BP Location: Right Arm)   Pulse 87   Temp 97.9 F (36.6 C) (Oral)   Resp 18   SpO2 94%   Visual Acuity Right Eye Distance:   Left Eye Distance:   Bilateral Distance:    Right Eye Near:   Left Eye Near:    Bilateral Near:     Physical Exam Vitals and nursing note reviewed.  Constitutional:      General: He is awake. He is not in acute distress.    Appearance: Normal appearance. He is well-developed and well-groomed. He is not ill-appearing.  Cardiovascular:     Rate and Rhythm: Normal rate and regular rhythm.     Pulses:          Popliteal pulses are 2+ on  the right side and 2+ on the left side.       Dorsalis pedis pulses are 2+ on the  right side and 2+ on the left side.       Posterior tibial pulses are 2+ on the right side and 2+ on the left side.     Heart sounds: Normal heart sounds.  Pulmonary:     Effort: Pulmonary effort is normal.     Breath sounds: Normal breath sounds.  Musculoskeletal:     Right lower leg: 3+ Pitting Edema present.     Left lower leg: 3+ Pitting Edema present.     Right ankle: Swelling present. No tenderness. Normal pulse.     Left ankle: Swelling present. No tenderness. Normal pulse.     Right foot: Normal capillary refill. Swelling present. No tenderness. Normal pulse.     Left foot: Normal capillary refill. Swelling present. No tenderness. Normal pulse.  Skin:    General: Skin is warm and dry.     Findings: Erythema present.     Comments: Significant erythema noted to bilateral lower legs consistent with previous diagnosis of venous stasis dermatitis  Neurological:     Mental Status: He is alert.  Psychiatric:        Behavior: Behavior is cooperative.      UC Treatments / Results  Labs (all labs ordered are listed, but only abnormal results are displayed) Labs Reviewed - No data to display  EKG   Radiology No results found.  Procedures Procedures (including critical care time)  Medications Ordered in UC Medications - No data to display  Initial Impression / Assessment and Plan / UC Course  I have reviewed the triage vital signs and the nursing notes.  Pertinent labs & imaging results that were available during my care of the patient were reviewed by me and considered in my medical decision making (see chart for details).     Patient is overall well-appearing.  Vitals are stable.  Exam findings consistent with previous diagnosis of venous stasis dermatitis and venous insufficiency with lymphedema.  However prescribe doxycycline  for cellulitis coverage due to new development of color  change and patient concern for developing this.  Given information for vascular specialist to follow-up with for further evaluation.  Discussed follow-up, return, and strict ER precautions. Final Clinical Impressions(s) / UC Diagnoses   Final diagnoses:  Bilateral lower extremity edema  Discoloration of skin of lower leg     Discharge Instructions      Start taking doxycycline  twice daily for 10 days for possible development of cellulitis. As discussed I do believe your symptoms are likely related to your previous diagnosis of venous stasis dermatitis. I recommend following up with the dermatologist or wound care specialist that you saw previously for further evaluation of this. You can also follow-up with the vascular and vein specialist that I have attached in your paperwork for further evaluation as well. Otherwise follow-up with your primary care provider or return here as needed. If you notice significant increase in swelling, worsening discoloration, significant pain, fever, shortness of breath, or weakness please seek immediate medical treatment in the emergency department.   ED Prescriptions     Medication Sig Dispense Auth. Provider   doxycycline  (VIBRAMYCIN ) 100 MG capsule Take 1 capsule (100 mg total) by mouth 2 (two) times daily. 20 capsule Johnie Flaming A, NP      PDMP not reviewed this encounter.   Johnie Flaming A, NP 05/29/24 1024

## 2024-05-29 NOTE — Discharge Instructions (Addendum)
 Start taking doxycycline  twice daily for 10 days for possible development of cellulitis. As discussed I do believe your symptoms are likely related to your previous diagnosis of venous stasis dermatitis. I recommend following up with the dermatologist or wound care specialist that you saw previously for further evaluation of this. You can also follow-up with the vascular and vein specialist that I have attached in your paperwork for further evaluation as well. Otherwise follow-up with your primary care provider or return here as needed. If you notice significant increase in swelling, worsening discoloration, significant pain, fever, shortness of breath, or weakness please seek immediate medical treatment in the emergency department.

## 2024-06-01 ENCOUNTER — Ambulatory Visit: Admit: 2024-06-01 | Discharge: 2024-06-02 | Payer: MEDICARE | Attending: Family | Primary: Family

## 2024-06-02 DIAGNOSIS — Z79899 Other long term (current) drug therapy: Principal | ICD-10-CM

## 2024-06-02 DIAGNOSIS — Z940 Kidney transplant status: Principal | ICD-10-CM

## 2024-06-08 DIAGNOSIS — Z944 Liver transplant status: Principal | ICD-10-CM

## 2024-06-08 DIAGNOSIS — Z79899 Other long term (current) drug therapy: Principal | ICD-10-CM

## 2024-06-18 ENCOUNTER — Other Ambulatory Visit: Payer: Self-pay

## 2024-06-18 ENCOUNTER — Inpatient Hospital Stay (HOSPITAL_COMMUNITY)
Admission: EM | Admit: 2024-06-18 | Discharge: 2024-06-24 | DRG: 603 | Disposition: A | Discharge status: Home Health | Source: Ambulatory Visit | Attending: Internal Medicine | Admitting: Internal Medicine

## 2024-06-18 ENCOUNTER — Ambulatory Visit (HOSPITAL_COMMUNITY)
Admission: EM | Admit: 2024-06-18 | Discharge: 2024-06-18 | Disposition: A | Discharge status: Home / Self Care | Source: Home / Self Care

## 2024-06-18 ENCOUNTER — Emergency Department (HOSPITAL_COMMUNITY)

## 2024-06-18 ENCOUNTER — Encounter (HOSPITAL_COMMUNITY): Payer: Self-pay | Admitting: Emergency Medicine

## 2024-06-18 DIAGNOSIS — L538 Other specified erythematous conditions: Secondary | ICD-10-CM | POA: Diagnosis not present

## 2024-06-18 DIAGNOSIS — D849 Immunodeficiency, unspecified: Secondary | ICD-10-CM | POA: Diagnosis not present

## 2024-06-18 DIAGNOSIS — I890 Lymphedema, not elsewhere classified: Secondary | ICD-10-CM | POA: Diagnosis not present

## 2024-06-18 DIAGNOSIS — L03116 Cellulitis of left lower limb: Secondary | ICD-10-CM

## 2024-06-18 DIAGNOSIS — R7881 Bacteremia: Secondary | ICD-10-CM | POA: Diagnosis present

## 2024-06-18 DIAGNOSIS — Z981 Arthrodesis status: Secondary | ICD-10-CM

## 2024-06-18 DIAGNOSIS — N178 Other acute kidney failure: Secondary | ICD-10-CM | POA: Diagnosis not present

## 2024-06-18 DIAGNOSIS — R52 Pain, unspecified: Secondary | ICD-10-CM | POA: Diagnosis not present

## 2024-06-18 DIAGNOSIS — G8929 Other chronic pain: Secondary | ICD-10-CM | POA: Diagnosis present

## 2024-06-18 DIAGNOSIS — E876 Hypokalemia: Secondary | ICD-10-CM | POA: Diagnosis present

## 2024-06-18 DIAGNOSIS — J441 Chronic obstructive pulmonary disease with (acute) exacerbation: Secondary | ICD-10-CM | POA: Diagnosis present

## 2024-06-18 DIAGNOSIS — I872 Venous insufficiency (chronic) (peripheral): Secondary | ICD-10-CM | POA: Diagnosis present

## 2024-06-18 DIAGNOSIS — Y830 Surgical operation with transplant of whole organ as the cause of abnormal reaction of the patient, or of later complication, without mention of misadventure at the time of the procedure: Secondary | ICD-10-CM | POA: Diagnosis present

## 2024-06-18 DIAGNOSIS — I480 Paroxysmal atrial fibrillation: Secondary | ICD-10-CM | POA: Diagnosis present

## 2024-06-18 DIAGNOSIS — N179 Acute kidney failure, unspecified: Secondary | ICD-10-CM | POA: Diagnosis present

## 2024-06-18 DIAGNOSIS — Z905 Acquired absence of kidney: Secondary | ICD-10-CM | POA: Diagnosis not present

## 2024-06-18 DIAGNOSIS — Z79624 Long term (current) use of inhibitors of nucleotide synthesis: Secondary | ICD-10-CM | POA: Diagnosis not present

## 2024-06-18 DIAGNOSIS — E785 Hyperlipidemia, unspecified: Secondary | ICD-10-CM | POA: Diagnosis present

## 2024-06-18 DIAGNOSIS — R600 Localized edema: Secondary | ICD-10-CM | POA: Diagnosis not present

## 2024-06-18 DIAGNOSIS — N1832 Chronic kidney disease, stage 3b: Secondary | ICD-10-CM | POA: Diagnosis present

## 2024-06-18 DIAGNOSIS — D84821 Immunodeficiency due to drugs: Secondary | ICD-10-CM | POA: Diagnosis present

## 2024-06-18 DIAGNOSIS — Z7984 Long term (current) use of oral hypoglycemic drugs: Secondary | ICD-10-CM | POA: Diagnosis not present

## 2024-06-18 DIAGNOSIS — K7581 Nonalcoholic steatohepatitis (NASH): Secondary | ICD-10-CM | POA: Diagnosis present

## 2024-06-18 DIAGNOSIS — M545 Low back pain, unspecified: Secondary | ICD-10-CM | POA: Diagnosis present

## 2024-06-18 DIAGNOSIS — Z79899 Other long term (current) drug therapy: Secondary | ICD-10-CM | POA: Diagnosis not present

## 2024-06-18 DIAGNOSIS — Z860100 Personal history of colon polyps, unspecified: Secondary | ICD-10-CM

## 2024-06-18 DIAGNOSIS — Z940 Kidney transplant status: Secondary | ICD-10-CM

## 2024-06-18 DIAGNOSIS — I10 Essential (primary) hypertension: Secondary | ICD-10-CM

## 2024-06-18 DIAGNOSIS — B957 Other staphylococcus as the cause of diseases classified elsewhere: Secondary | ICD-10-CM | POA: Diagnosis present

## 2024-06-18 DIAGNOSIS — E872 Acidosis, unspecified: Secondary | ICD-10-CM | POA: Diagnosis present

## 2024-06-18 DIAGNOSIS — Z6841 Body Mass Index (BMI) 40.0 and over, adult: Secondary | ICD-10-CM

## 2024-06-18 DIAGNOSIS — E860 Dehydration: Secondary | ICD-10-CM | POA: Diagnosis present

## 2024-06-18 DIAGNOSIS — T8612 Kidney transplant failure: Secondary | ICD-10-CM | POA: Diagnosis present

## 2024-06-18 DIAGNOSIS — I878 Other specified disorders of veins: Secondary | ICD-10-CM | POA: Diagnosis present

## 2024-06-18 DIAGNOSIS — Z944 Liver transplant status: Secondary | ICD-10-CM

## 2024-06-18 DIAGNOSIS — M7989 Other specified soft tissue disorders: Secondary | ICD-10-CM | POA: Diagnosis not present

## 2024-06-18 DIAGNOSIS — Z79621 Long term (current) use of calcineurin inhibitor: Secondary | ICD-10-CM

## 2024-06-18 DIAGNOSIS — I129 Hypertensive chronic kidney disease with stage 1 through stage 4 chronic kidney disease, or unspecified chronic kidney disease: Secondary | ICD-10-CM | POA: Diagnosis present

## 2024-06-18 DIAGNOSIS — N1831 Chronic kidney disease, stage 3a: Secondary | ICD-10-CM | POA: Diagnosis not present

## 2024-06-18 DIAGNOSIS — Z881 Allergy status to other antibiotic agents status: Secondary | ICD-10-CM

## 2024-06-18 DIAGNOSIS — Z888 Allergy status to other drugs, medicaments and biological substances status: Secondary | ICD-10-CM

## 2024-06-18 DIAGNOSIS — M109 Gout, unspecified: Secondary | ICD-10-CM | POA: Diagnosis present

## 2024-06-18 LAB — CBC WITH DIFFERENTIAL/PLATELET
Abs Immature Granulocytes: 0.07 K/uL (ref 0.00–0.07)
Basophils Absolute: 0 K/uL (ref 0.0–0.1)
Basophils Relative: 1 %
Eosinophils Absolute: 0.1 K/uL (ref 0.0–0.5)
Eosinophils Relative: 1 %
HCT: 44.4 % (ref 39.0–52.0)
Hemoglobin: 14.9 g/dL (ref 13.0–17.0)
Immature Granulocytes: 1 %
Lymphocytes Relative: 13 %
Lymphs Abs: 1.1 K/uL (ref 0.7–4.0)
MCH: 30.2 pg (ref 26.0–34.0)
MCHC: 33.6 g/dL (ref 30.0–36.0)
MCV: 89.9 fL (ref 80.0–100.0)
Monocytes Absolute: 0.6 K/uL (ref 0.1–1.0)
Monocytes Relative: 7 %
Neutro Abs: 6.3 K/uL (ref 1.7–7.7)
Neutrophils Relative %: 77 %
Platelets: 172 K/uL (ref 150–400)
RBC: 4.94 MIL/uL (ref 4.22–5.81)
RDW: 13.8 % (ref 11.5–15.5)
WBC: 8.1 K/uL (ref 4.0–10.5)
nRBC: 0 % (ref 0.0–0.2)

## 2024-06-18 LAB — HEPATIC FUNCTION PANEL
ALT: 19 U/L (ref 0–44)
AST: 18 U/L (ref 15–41)
Albumin: 3.5 g/dL (ref 3.5–5.0)
Alkaline Phosphatase: 117 U/L (ref 38–126)
Bilirubin, Direct: 0.4 mg/dL — ABNORMAL HIGH (ref 0.0–0.2)
Indirect Bilirubin: 0.6 mg/dL (ref 0.3–0.9)
Total Bilirubin: 1 mg/dL (ref 0.0–1.2)
Total Protein: 7.1 g/dL (ref 6.5–8.1)

## 2024-06-18 LAB — BASIC METABOLIC PANEL WITH GFR
Anion gap: 14 (ref 5–15)
BUN: 39 mg/dL — ABNORMAL HIGH (ref 8–23)
CO2: 23 mmol/L (ref 22–32)
Calcium: 9 mg/dL (ref 8.9–10.3)
Chloride: 100 mmol/L (ref 98–111)
Creatinine, Ser: 1.88 mg/dL — ABNORMAL HIGH (ref 0.61–1.24)
GFR, Estimated: 37 mL/min — ABNORMAL LOW (ref >60)
Glucose, Bld: 187 mg/dL — ABNORMAL HIGH (ref 70–99)
Potassium: 3.3 mmol/L — ABNORMAL LOW (ref 3.5–5.1)
Sodium: 137 mmol/L (ref 135–145)

## 2024-06-18 LAB — I-STAT CG4 LACTIC ACID, ED: Lactic Acid, Venous: 1.3 mmol/L (ref 0.5–1.9)

## 2024-06-18 MED ORDER — LEVOFLOXACIN IN D5W 750 MG/150ML IV SOLN
750.0000 mg | Freq: Once | INTRAVENOUS | Status: AC
  Administered 2024-06-18: 23:00:00 750 mg via INTRAVENOUS
  Filled 2024-06-18: qty 150

## 2024-06-18 MED ORDER — VANCOMYCIN HCL IN DEXTROSE 1-5 GM/200ML-% IV SOLN
1000.0000 mg | Freq: Once | INTRAVENOUS | Status: AC
  Administered 2024-06-18: 23:00:00 1000 mg via INTRAVENOUS
  Filled 2024-06-18: qty 200

## 2024-06-18 NOTE — ED Provider Notes (Signed)
 [facility]    CSN: [id number] Arrival date & time: 06/18/24  1656      History   Chief Complaint Chief Complaint  Patient presents with   Leg Swelling    HPI [name] [name] is a 78 y.o. male.   78 year old male who presents urgent care with complaints of left lower extremity redness, pain and swelling.  He has actually had increased swelling in the left leg for several weeks now.  He was prescribed a course of doxycycline  on November 28.  He has finished that course of antibiotics and initially thought his leg was a little bit better but then it became much more swollen and painful in the last several days.  He also has had some nausea, vomiting and shivering.  He has had a kidney and liver transplant and just recently had to go off of one of his rejection medications due to cost.  He has not had a fever that he knows of.  He does have longstanding lower extremity edema.     Past Medical History:  Diagnosis Date   A-fib [facility])    post-operative afib 12/01/15   Arthritis    spine, hips (10/17/2016)   Benign neoplasm of colon    Chronic lower back pain    End stage liver disease (HCC)    End stage renal disease (HCC) 2014   Had transplant; stopped dialysis 2014   Gout    History of blood transfusion    w/my transplants   History of kidney stones    was in the new kidney has passed   IgA nephropathy    Kidney transplant recipient    Liver transplant recipient [facility])    NASH (nonalcoholic steatohepatitis)    Neuromuscular disorder (HCC)    Obesity     Patient Active Problem List   Diagnosis Date Noted   Renal transplant, status post 05/02/2019   Immunosuppressed status 05/02/2019   Hypertensive retinopathy of both eyes 02/25/2019   Posterior vitreous detachment of left eye 02/25/2019   Hx of LASIK 02/25/2019   End stage liver disease (HCC) 11/22/2016   Hyperlipidemia 11/22/2016   Atrial fibrillation with RVR (HCC) 12/01/2015   Morbid obesity (HCC)  12/01/2015   Chronic gout, unspecified, without tophus (tophi) 06/28/2015   End-stage renal disease (HCC) 10/16/2013   Nephrolithiasis 10/21/2012   Benign neoplasm of colon 06/28/2011    Past Surgical History:  Procedure Laterality Date   ANTERIOR LAT LUMBAR FUSION N/A 10/17/2016   Procedure: XLIF L3-4;  Surgeon: Donaciano Sprang, MD;  Location: [facility] OR;  Service: Orthopedics;  Laterality: N/A;  Requests for 3 hrs   AV FISTULA PLACEMENT Left ~ 2013   Av fistula removed Left ~ 2015   forearm   BACK SURGERY     CARPAL TUNNEL RELEASE Bilateral    CHOLECYSTECTOMY OPEN  2014   WITH LIVER TRANSPLANT   COLONOSCOPY     HERNIA REPAIR     LASIK     LIGATION OF ARTERIOVENOUS  FISTULA     pr ligath angioaccess av fistula   LIVER TRANSPLANT  2014   LUMBAR DISC SURGERY Left 12/01/2015   L3-L4 FAR LATERAL DISCECTOMY (WILTSE APPROACH   LUMBAR LAMINECTOMY/DECOMPRESSION MICRODISCECTOMY Left 12/01/2015   Procedure: LEFT L3-L4 FAR LATERAL DISCECTOMY (WILTSE APPROACH)     (1 LEVEL);  Surgeon: Donaciano Sprang, MD;  Location: [facility] OR;  Service: Orthopedics;  Laterality: Left;   NEPHRECTOMY RECIPIENT  2014   POSTERIOR LUMBAR FUSION  10/17/2016  Posterior fusion possible revision decompression L3-4/notes 10/17/2016   REFRACTIVE SURGERY Bilateral    UMBILICAL HERNIA REPAIR         Home Medications    Prior to Admission medications  Medication Sig Start Date End Date Taking? Authorizing Provider  allopurinol (ZYLOPRIM) 300 MG tablet Take 300 mg by mouth daily.    [provider]  doxycycline  (VIBRAMYCIN ) 100 MG capsule Take 1 capsule (100 mg total) by mouth 2 (two) times daily. 05/29/24   Johnie Flaming A, NP  JARDIANCE 10 MG TABS tablet Take 10 mg by mouth daily.    [provider]  mycophenolate  (MYFORTIC ) 180 MG EC tablet Take 180 mg by mouth 2 (two) times daily.    [provider]  oxyCODONE  (OXY IR/ROXICODONE ) 5 MG immediate release tablet Take 5 mg by mouth every 4 (four)  hours as needed. 12/03/19   [provider]  tacrolimus  (PROGRAF ) 0.5 MG capsule Take two caps (1mg  total) in the morning and one cap (0.5mg  total) in the evening 11/24/19   [provider]  gabapentin  (NEURONTIN ) 300 MG capsule Take 1 capsule (300 mg total) by mouth 3 (three) times daily. 10/19/16 09/14/19  [name], Dedra Maus, PA-C    Family History No family history on file.  Social History Social History[1]   Allergies   Cephalosporins and Zyvox [linezolid]   Review of Systems Review of Systems  Constitutional:  Positive for chills and fever.  HENT:  Negative for ear pain and sore throat.   Eyes:  Negative for pain and visual disturbance.  Respiratory:  Negative for cough and shortness of breath.   Cardiovascular:  Negative for chest pain and palpitations.  Gastrointestinal:  Positive for nausea and vomiting. Negative for abdominal pain.  Genitourinary:  Negative for dysuria and hematuria.  Musculoskeletal:  Negative for arthralgias and back pain.       Left leg pain  Skin:  Positive for color change. Negative for rash.  Neurological:  Negative for seizures and syncope.  All other systems reviewed and are negative.    Physical Exam Triage Vital Signs ED Triage Vitals  Encounter Vitals Group     BP 06/18/24 1728 112/69     Girls Systolic BP Percentile --      Girls Diastolic BP Percentile --      Boys Systolic BP Percentile --      Boys Diastolic BP Percentile --      Pulse Rate 06/18/24 1728 92     Resp 06/18/24 1728 20     Temp 06/18/24 1728 97.7 F (36.5 C)     Temp Source 06/18/24 1728 Oral     SpO2 06/18/24 1728 94 %     Weight --      Height --      Head Circumference --      Peak Flow --      Pain Score 06/18/24 1724 0     Pain Loc --      Pain Education --      Exclude from Growth Chart --    No data found.  Updated Vital Signs BP 112/69 (BP Location: Right Arm)   Pulse 92   Temp 97.7 F (36.5 C) (Oral)   Resp 20   SpO2 94%    Visual Acuity Right Eye Distance:   Left Eye Distance:   Bilateral Distance:    Right Eye Near:   Left Eye Near:    Bilateral Near:     Physical Exam  Vitals and nursing note reviewed.  Constitutional:      General: He is not in acute distress.    Appearance: He is well-developed.  HENT:     Head: Normocephalic and atraumatic.     Mouth/Throat:     Mouth: Mucous membranes are moist.  Eyes:     Conjunctiva/sclera: Conjunctivae normal.  Cardiovascular:     Rate and Rhythm: Normal rate and regular rhythm.     Heart sounds: No murmur heard. Pulmonary:     Effort: Pulmonary effort is normal. No respiratory distress.     Breath sounds: Normal breath sounds.  Abdominal:     Palpations: Abdomen is soft.     Tenderness: There is no abdominal tenderness.  Musculoskeletal:        General: No swelling.     Cervical back: Neck supple.     Right lower leg: Edema (Stasis changes) present.     Left lower leg: Edema (Massively edematous and significantly erythematous, also with stasis changes, tender to palpation, pedal pulse palpable) present.  Skin:    General: Skin is warm and dry.     Capillary Refill: Capillary refill takes less than 2 seconds.     Findings: Erythema present.  Neurological:     Mental Status: He is alert.  Psychiatric:        Mood and Affect: Mood normal.      UC Treatments / Results  Labs (all labs ordered are listed, but only abnormal results are displayed) Labs Reviewed - No data to display  EKG   Radiology No results found.  Procedures Procedures (including critical care time)  Medications Ordered in UC Medications - No data to display  Initial Impression / Assessment and Plan / UC Course  I have reviewed the triage vital signs and the nursing notes.  Pertinent labs & imaging results that were available during my care of the patient were reviewed by me and considered in my medical decision making (see chart for details).     Cellulitis  of leg, left  Edema of left lower extremity  Bilateral lower extremity edema   Patient presenting with significant left lower extremity edema, erythremia, pain and symptoms concerning for possible developing sepsis.  Due to the failure of outpatient antibiotics, the immune suppressive state and the presenting symptoms, we recommend further evaluation at the emergency department.  Final Clinical Impressions(s) / UC Diagnoses   Final diagnoses:  Cellulitis of leg, left  Edema of left lower extremity  Bilateral lower extremity edema     Discharge Instructions      Due to the presenting symptoms and physical exam findings, we feel that further evaluation is indicated at the emergency department.     ED Prescriptions   None    PDMP not reviewed this encounter.    [1]  Social History Tobacco Use   Smoking status: Never   Smokeless tobacco: Never  Substance Use Topics   Alcohol use: No   Drug use: No     Teresa Almarie LABOR, PA-C 06/18/24 1751

## 2024-06-18 NOTE — ED Triage Notes (Signed)
 Pt reports that left lower leg for a couple weeks and redness. Pt reports become painful. Pt was given antibiotics that was prescribed here on 11/28.  Pt reports had liver and kidney transplant and came off medication for that on Saturday. Sunday after church felt started felling bad. Reports Monday started having vomiting and fever. Pt also has been shivering during the 4 day period.

## 2024-06-18 NOTE — Discharge Instructions (Signed)
 Due to the presenting symptoms and physical exam findings, we feel that further evaluation is indicated at the emergency department.

## 2024-06-18 NOTE — ED Triage Notes (Addendum)
 Patient c/o LLE redness/swelling/cellulitis/fever/pain. Has had LLE swelling for 1 month, prescribed 10 days doxycycline  PO 11/28 which he completed.

## 2024-06-18 NOTE — ED Provider Notes (Cosign Needed)
 [name] [name] EMERGENCY DEPARTMENT AT [facility] Provider Note   CSN: [id number] Arrival date & time: 06/18/24  1800     Patient presents with: Leg Swelling and Cellulitis   [name] [name] is a 78 y.o. male.   78 y.o male with a PMH of kidney transplant, liver transplant, A-fib not anticoagulated presents to the ED with a chief complaint of lower extremity swelling which has been ongoing for the past 2 weeks.  Previously evaluated by PCP, placed on a 10-day course of doxycycline  which he just finished.  Went to urgent care today because since Sunday he has been experiencing some fevers, chills along with worsening pain at home.  According to his wife he had a Tmax of 100.7, given Tylenol  to help with pain control.  There is more swelling noted to the left lower leg, underlying history of venous stasis.  Not been taking any medicine for improvement in symptoms.  Does take a fluid pill, reports approximately 40 mg daily.  He is somewhat short of breath, however tells me that this is his usual breathing.  No chest pain, cough, or other complaints.   The history is provided by the patient.       Prior to Admission medications  Medication Sig Start Date End Date Taking? Authorizing Provider  allopurinol (ZYLOPRIM) 300 MG tablet Take 300 mg by mouth every evening.   Yes [provider]  furosemide (LASIX) 20 MG tablet Take 20 mg by mouth 2 (two) times daily.   Yes [provider]  JARDIANCE  10 MG TABS tablet Take 10 mg by mouth daily.   Yes [provider]  losartan  (COZAAR ) 50 MG tablet Take 50 mg by mouth daily. 08/02/23 08/01/24 Yes [provider]  mycophenolate  (MYFORTIC ) 180 MG EC tablet Take 360 mg by mouth 2 (two) times daily.   Yes [provider]  tacrolimus  (PROGRAF ) 0.5 MG capsule Take 0.5 mg by mouth every evening. 11/24/19  Yes [provider]  tacrolimus  (PROGRAF ) 1 MG capsule Take 1 mg by mouth daily with breakfast.    Yes [provider]  doxycycline  (VIBRAMYCIN ) 100 MG capsule Take 1 capsule (100 mg total) by mouth 2 (two) times daily. Patient not taking: Reported on 06/18/2024 05/29/24   Johnie Flaming A, NP  gabapentin  (NEURONTIN ) 300 MG capsule Take 1 capsule (300 mg total) by mouth 3 (three) times daily. 10/19/16 09/14/19  [name], Dedra Maus, PA-C    Allergies: Cephalosporins and Zyvox [linezolid]    Review of Systems  Constitutional:  Negative for chills and fever.  HENT:  Negative for sinus pressure.   Respiratory:  Positive for shortness of breath.   Cardiovascular:  Negative for chest pain.  Gastrointestinal:  Negative for abdominal pain, nausea and vomiting.  Genitourinary:  Negative for flank pain.  Musculoskeletal:  Negative for back pain.  Skin:  Positive for color change and wound.  All other systems reviewed and are negative.   Updated Vital Signs BP 118/69 (BP Location: Right Arm)   Pulse 90   Temp 98.3 F (36.8 C) (Oral)   Resp 20   SpO2 99%   Physical Exam Vitals and nursing note reviewed.  Constitutional:      Appearance: Normal appearance. He is obese.  HENT:     Head: Normocephalic and atraumatic.     Nose: Nose normal.     Mouth/Throat:     Mouth: Mucous membranes are moist.  Cardiovascular:     Rate and Rhythm: Normal  rate.  Pulmonary:     Effort: Pulmonary effort is normal.  Abdominal:     General: Abdomen is flat.  Musculoskeletal:        General: Swelling present.     Cervical back: Normal range of motion and neck supple.     Right lower leg: 2+ Pitting Edema present.     Left lower leg: 2+ Pitting Edema present.  Skin:    General: Skin is warm and dry.     Findings: Erythema present.  Neurological:     Mental Status: He is alert and oriented to person, place, and time.        (all labs ordered are listed, but only abnormal results are displayed) Labs Reviewed  BASIC METABOLIC PANEL WITH GFR - Abnormal; Notable for the following  components:      Result Value   Potassium 3.3 (*)    Glucose, Bld 187 (*)    BUN 39 (*)    Creatinine, Ser 1.88 (*)    GFR, Estimated 37 (*)    All other components within normal limits  HEPATIC FUNCTION PANEL - Abnormal; Notable for the following components:   Bilirubin, Direct 0.4 (*)    All other components within normal limits  CULTURE, BLOOD (ROUTINE X 2)  CULTURE, BLOOD (ROUTINE X 2)  CBC WITH DIFFERENTIAL/PLATELET  I-STAT CG4 LACTIC ACID, ED  I-STAT CG4 LACTIC ACID, ED    EKG: None  Radiology: No results found.   Procedures   Medications Ordered in the ED  vancomycin  (VANCOCIN ) IVPB 1000 mg/200 mL premix (1,000 mg Intravenous New Bag/Given 06/18/24 2312)  levofloxacin  (LEVAQUIN ) IVPB 750 mg (750 mg Intravenous New Bag/Given 06/18/24 2314)                                    Medical Decision Making Amount and/or Complexity of Data Reviewed Labs: ordered. Radiology: ordered.  Risk Prescription drug management.    This patient presents to the ED for concern of leg swelling, this involves a number of treatment options, and is a complaint that carries with it a high risk of complications and morbidity.  The differential diagnosis includes DVT, cellulitis versus venous stasis.    Co morbidities: Discussed in HPI   Brief History:  See HPI.  EMR reviewed including pt PMHx, past surgical history and past visits to ER.   See HPI for more details   Lab Tests:  I ordered and independently interpreted labs.  The pertinent results include:    CBC with no leukocytosis, hemoglobin within normal limits.  BMP with no Electra derangement, mild hypokalemia.  Creatinine level slightly elevated however consistent with his prior.  GFR continues to be 37.  Hepatic function was added along with lactic acid.   Imaging Studies:  Chest xray is pending.   Cardiac Monitoring:  EKG pending   Medicines ordered:  I ordered medication including levo and vancomycin    for cellulitis treatment  Reevaluation of the patient after these medicines showed that the patient stayed the same I have reviewed the patients home medicines and have made adjustments as needed  Reevaluation:  After the interventions noted above I re-evaluated patient and found that they have :stayed the same  Social Determinants of Health:  The patient's social determinants of health were a factor in the care of this patient  Problem List / ED Course:  Patient presented to the ED with a chief  complaint of left leg swelling along with cellulitis.  Patient was seen by his PCP approximately 2 weeks ago, placed on a 10-day course of doxycycline , reports he has taken the entire course of antibiotics.  On Sunday he began spiking fevers Tmax of 100.7 at home according to wife.  Has continued to have worsening swelling of the left leg, there is 2+ pitting edema on exam.  He tells me that they had an ultrasound DVT study performed in the month of August that did not show any clot.  He does have a chronic history of venous stasis however worsening erythema and worsening pain.  Patient is on multiple rejection medication for his liver and kidney transplant. BMP with slight elevation in his creatinine however within his baseline.  Hepatic function is within normal limits.  CBC with no leukocytosis, hemoglobin is within normal limits.  Lactic acid is negative.  Blood cultures have been added. Patient started on IV antibiotics levo, vancomycin  for further treatment of cellulitis.  Dispostion:  After consideration of the diagnostic results and the patients response to treatment, I feel that the patent would benefit from admission for further management of cellulitis.    Portions of this note were generated with Scientist, clinical (histocompatibility and immunogenetics). Dictation errors may occur despite best attempts at proofreading.  Final diagnoses:  Cellulitis of left lower extremity    ED Discharge Orders     None           Maureen Broad, [place] 06/18/24 2336

## 2024-06-18 NOTE — ED Notes (Signed)
 Patient is being discharged from the Urgent Care and sent to the Emergency Department via POV . Per Vertell Pizza, PA, patient is in need of higher level of care due to cellulitis. Patient is aware and verbalizes understanding of plan of care.  Vitals:   06/18/24 1728  BP: 112/69  Pulse: 92  Resp: 20  Temp: 97.7 F (36.5 C)  SpO2: 94%

## 2024-06-19 ENCOUNTER — Inpatient Hospital Stay (HOSPITAL_COMMUNITY)

## 2024-06-19 ENCOUNTER — Encounter (HOSPITAL_COMMUNITY): Payer: Self-pay | Admitting: Family Medicine

## 2024-06-19 DIAGNOSIS — M7989 Other specified soft tissue disorders: Secondary | ICD-10-CM

## 2024-06-19 DIAGNOSIS — L538 Other specified erythematous conditions: Secondary | ICD-10-CM | POA: Diagnosis not present

## 2024-06-19 DIAGNOSIS — Z944 Liver transplant status: Secondary | ICD-10-CM

## 2024-06-19 DIAGNOSIS — L03116 Cellulitis of left lower limb: Secondary | ICD-10-CM | POA: Diagnosis not present

## 2024-06-19 DIAGNOSIS — R52 Pain, unspecified: Secondary | ICD-10-CM

## 2024-06-19 DIAGNOSIS — N179 Acute kidney failure, unspecified: Secondary | ICD-10-CM | POA: Diagnosis present

## 2024-06-19 LAB — COMPREHENSIVE METABOLIC PANEL WITH GFR
ALT: 14 U/L (ref 0–44)
AST: 15 U/L (ref 15–41)
Albumin: 3.1 g/dL — ABNORMAL LOW (ref 3.5–5.0)
Alkaline Phosphatase: 94 U/L (ref 38–126)
Anion gap: 14 (ref 5–15)
BUN: 34 mg/dL — ABNORMAL HIGH (ref 8–23)
CO2: 21 mmol/L — ABNORMAL LOW (ref 22–32)
Calcium: 8.6 mg/dL — ABNORMAL LOW (ref 8.9–10.3)
Chloride: 103 mmol/L (ref 98–111)
Creatinine, Ser: 1.78 mg/dL — ABNORMAL HIGH (ref 0.61–1.24)
GFR, Estimated: 39 mL/min — ABNORMAL LOW
Glucose, Bld: 149 mg/dL — ABNORMAL HIGH (ref 70–99)
Potassium: 3.2 mmol/L — ABNORMAL LOW (ref 3.5–5.1)
Sodium: 138 mmol/L (ref 135–145)
Total Bilirubin: 0.7 mg/dL (ref 0.0–1.2)
Total Protein: 6.2 g/dL — ABNORMAL LOW (ref 6.5–8.1)

## 2024-06-19 LAB — URINALYSIS, ROUTINE W REFLEX MICROSCOPIC
Bilirubin Urine: NEGATIVE
Glucose, UA: >=500 mg/dL — AB
Hgb urine dipstick: NEGATIVE
Ketones, ur: 5 mg/dL — AB
Leukocytes,Ua: NEGATIVE
Nitrite: NEGATIVE
Protein, ur: NEGATIVE mg/dL
Specific Gravity, Urine: 1.017 (ref 1.005–1.030)
pH: 5 (ref 5.0–8.0)

## 2024-06-19 LAB — BASIC METABOLIC PANEL WITH GFR
Anion gap: 14 (ref 5–15)
BUN: 38 mg/dL — ABNORMAL HIGH (ref 8–23)
CO2: 24 mmol/L (ref 22–32)
Calcium: 8.6 mg/dL — ABNORMAL LOW (ref 8.9–10.3)
Chloride: 101 mmol/L (ref 98–111)
Creatinine, Ser: 1.85 mg/dL — ABNORMAL HIGH (ref 0.61–1.24)
GFR, Estimated: 37 mL/min — ABNORMAL LOW
Glucose, Bld: 134 mg/dL — ABNORMAL HIGH (ref 70–99)
Potassium: 3.2 mmol/L — ABNORMAL LOW (ref 3.5–5.1)
Sodium: 139 mmol/L (ref 135–145)

## 2024-06-19 LAB — CBC
HCT: 40.2 % (ref 39.0–52.0)
Hemoglobin: 13.4 g/dL (ref 13.0–17.0)
MCH: 30.1 pg (ref 26.0–34.0)
MCHC: 33.3 g/dL (ref 30.0–36.0)
MCV: 90.3 fL (ref 80.0–100.0)
Platelets: 143 K/uL — ABNORMAL LOW (ref 150–400)
RBC: 4.45 MIL/uL (ref 4.22–5.81)
RDW: 13.6 % (ref 11.5–15.5)
WBC: 6.2 K/uL (ref 4.0–10.5)
nRBC: 0 % (ref 0.0–0.2)

## 2024-06-19 LAB — CREATININE, URINE, RANDOM: Creatinine, Urine: 177 mg/dL

## 2024-06-19 LAB — SODIUM, URINE, RANDOM: Sodium, Ur: <30 mmol/L

## 2024-06-19 LAB — MAGNESIUM: Magnesium: 2.2 mg/dL (ref 1.7–2.4)

## 2024-06-19 MED ORDER — ACETAMINOPHEN 650 MG RE SUPP: 650.0000 mg | Freq: Four times a day (QID) | RECTAL | Status: DC | PRN

## 2024-06-19 MED ORDER — VANCOMYCIN HCL 1500 MG/300ML IV SOLN
1500.0000 mg | Freq: Once | INTRAVENOUS | Status: AC
  Administered 2024-06-19: 1500 mg via INTRAVENOUS
  Filled 2024-06-19: qty 300

## 2024-06-19 MED ORDER — POTASSIUM CHLORIDE CRYS ER 20 MEQ PO TBCR
20.0000 meq | EXTENDED_RELEASE_TABLET | Freq: Once | ORAL | Status: AC
  Administered 2024-06-19: 20 meq via ORAL
  Filled 2024-06-19: qty 1

## 2024-06-19 MED ORDER — MYCOPHENOLATE SODIUM 180 MG PO TBEC
360.0000 mg | DELAYED_RELEASE_TABLET | Freq: Two times a day (BID) | ORAL | Status: DC
  Administered 2024-06-20 – 2024-06-22 (×3): 360 mg via ORAL
  Filled 2024-06-19 (×11): qty 2

## 2024-06-19 MED ORDER — SODIUM CHLORIDE 0.9 % IV BOLUS: 750.0000 mL | Freq: Once | INTRAVENOUS | Status: DC

## 2024-06-19 MED ORDER — ACETAMINOPHEN 325 MG PO TABS
650.0000 mg | ORAL_TABLET | Freq: Four times a day (QID) | ORAL | Status: DC | PRN
  Filled 2024-06-19: qty 2

## 2024-06-19 MED ORDER — ENOXAPARIN SODIUM 80 MG/0.8ML IJ SOSY
65.0000 mg | PREFILLED_SYRINGE | INTRAMUSCULAR | Status: DC
  Administered 2024-06-19: 65 mg via SUBCUTANEOUS
  Filled 2024-06-19: qty 0.8

## 2024-06-19 MED ORDER — SENNA 8.6 MG PO TABS
1.0000 | ORAL_TABLET | Freq: Every day | ORAL | Status: DC | PRN
  Filled 2024-06-19: qty 1

## 2024-06-19 MED ORDER — SODIUM CHLORIDE 0.9 % IV SOLN: INTRAVENOUS | Status: DC

## 2024-06-19 MED ORDER — SODIUM CHLORIDE 0.9 % IV SOLN
1.0000 g | Freq: Two times a day (BID) | INTRAVENOUS | Status: DC
  Administered 2024-06-19: 1 g via INTRAVENOUS
  Filled 2024-06-19 (×2): qty 20

## 2024-06-19 MED ORDER — SODIUM CHLORIDE 0.9 % IV SOLN
3.0000 g | Freq: Four times a day (QID) | INTRAVENOUS | Status: DC
  Administered 2024-06-19 – 2024-06-20 (×3): 3 g via INTRAVENOUS
  Filled 2024-06-19 (×5): qty 8

## 2024-06-19 MED ORDER — TACROLIMUS 0.5 MG PO CAPS
0.5000 mg | ORAL_CAPSULE | Freq: Every evening | ORAL | Status: DC
  Administered 2024-06-20: 0.5 mg via ORAL
  Filled 2024-06-19 (×5): qty 1

## 2024-06-19 MED ORDER — OXYCODONE HCL 5 MG PO TABS: 5.0000 mg | ORAL_TABLET | ORAL | Status: DC | PRN

## 2024-06-19 MED ORDER — HYDROMORPHONE HCL 1 MG/ML IJ SOLN: 0.5000 mg | INTRAMUSCULAR | Status: DC | PRN

## 2024-06-19 MED ORDER — DAPTOMYCIN-SODIUM CHLORIDE 500-0.9 MG/50ML-% IV SOLN
4.0000 mg/kg | Freq: Every day | INTRAVENOUS | Status: DC
  Administered 2024-06-19 – 2024-06-23 (×5): 500 mg via INTRAVENOUS
  Filled 2024-06-19 (×8): qty 50

## 2024-06-19 MED ORDER — HEPARIN SODIUM (PORCINE) 5000 UNIT/ML IJ SOLN
5000.0000 [IU] | Freq: Three times a day (TID) | INTRAMUSCULAR | Status: DC
  Administered 2024-06-20: 5000 [IU] via SUBCUTANEOUS
  Filled 2024-06-19 (×4): qty 1

## 2024-06-19 MED ORDER — POTASSIUM CHLORIDE CRYS ER 20 MEQ PO TBCR
40.0000 meq | EXTENDED_RELEASE_TABLET | Freq: Once | ORAL | Status: AC
  Administered 2024-06-19: 40 meq via ORAL
  Filled 2024-06-19: qty 2

## 2024-06-19 MED ORDER — PROCHLORPERAZINE EDISYLATE 10 MG/2ML IJ SOLN: 5.0000 mg | Freq: Four times a day (QID) | INTRAMUSCULAR | Status: DC | PRN

## 2024-06-19 MED ORDER — TACROLIMUS 1 MG PO CAPS
1.0000 mg | ORAL_CAPSULE | Freq: Every day | ORAL | Status: DC
  Administered 2024-06-21 – 2024-06-22 (×2): 1 mg via ORAL
  Filled 2024-06-19 (×6): qty 1

## 2024-06-19 NOTE — Plan of Care (Signed)

## 2024-06-19 NOTE — Plan of Care (Signed)

## 2024-06-19 NOTE — Progress Notes (Addendum)
 Pharmacy Antibiotic Note  [name] [name] is a 78 y.o. male admitted on 06/18/2024 with cellulitis.  Pharmacy has been consulted for daptomycin dosing.  Plan: -Daptomycin 500 mg IV q24h -Weekly CK starting 12/20  Height: 6' (182.9 cm) Weight: 135.1 kg (297 lb 13.5 oz) IBW/kg (Calculated) : 77.6  Temp (24hrs), Avg:98.1 F (36.7 C), Min:97.8 F (36.6 C), Max:98.9 F (37.2 C)  Recent Labs  Lab 06/18/24 1903 06/18/24 2305 06/19/24 0344  WBC 8.1  --  6.2  CREATININE 1.88*  --  1.85*  LATICACIDVEN  --  1.3  --     Estimated Creatinine Clearance: 48.3 mL/min (A) (by C-G formula based on SCr of 1.85 mg/dL (H)).    Allergies[1]  Antimicrobials this admission: Daptomycin 12/19 >> Unasyn 12/19 >> Vancomycin /meropenem 12/18 >> 12/19  Dose adjustments this admission: NA  Microbiology results: 12/18 BCx: ngtd   Thank you for allowing pharmacy to be a part of this patients care.  Stefano MARLA Bologna, PharmD, BCPS Clinical Pharmacist 06/19/2024 6:47 PM      [1]  Allergies Allergen Reactions   Cephalosporins Other (See Comments)    Liver/Kidney failure    Zyvox [Linezolid] Nausea Only and Other (See Comments)    Liver/Kidney failure.

## 2024-06-19 NOTE — Progress Notes (Signed)
 VASCULAR LAB    Left lower extremity venous duplex has been performed.  See CV proc for preliminary results.   [place], RVT 06/19/2024, 10:39 AM

## 2024-06-19 NOTE — H&P (Signed)
 " History and Physical    [name] [name] FMW:[id number] DOB: July 05, 1945 DOA: 06/18/2024  PCP: Macie Benedetta POUR, MD   Patient coming from: Home   Chief Complaint: LLE redness and swelling  HPI: [name] [name] is a 78 y.o. 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  Patient reports roughly 2 weeks of redness and swelling involving the lower left leg.  He just completed a 10-day course of doxycycline  without any improvement.  He developed chills, fatigue, malaise, and vomiting on 06/14/2024 and his wife reports that he had a fever.  He has a loss of appetite but no further nausea or vomiting.  He denies abdominal pain.  He denies chest pain or shortness of breath.  ED Course: Upon arrival to the ED, patient is found to be afebrile and saturating mid 90s on room air with normal RR, normal HR, and stable BP.  Labs are most notable for potassium 3.3, creatinine 1.88, normal CBC, and normal lactate.  Blood cultures were collected in the ED and the patient was treated with vancomycin  and Levaquin .  Review of Systems:  All other systems reviewed and apart from HPI, are negative.  Past Medical History:  Diagnosis Date   A-fib [facility])    post-operative afib 12/01/15   Arthritis    spine, hips (10/17/2016)   Benign neoplasm of colon    Chronic lower back pain    End stage liver disease (HCC)    End stage renal disease (HCC) 2014   Had transplant; stopped dialysis 2014   Gout    History of blood transfusion    w/my transplants   History of kidney stones    was in the new kidney has passed   IgA nephropathy    Kidney transplant recipient    Liver transplant recipient [facility])    NASH  (nonalcoholic steatohepatitis)    Neuromuscular disorder (HCC)    Obesity     Past Surgical History:  Procedure Laterality Date   ANTERIOR LAT LUMBAR FUSION N/A 10/17/2016   Procedure: XLIF L3-4;  Surgeon: Donaciano Sprang, MD;  Location: [facility] OR;  Service: Orthopedics;  Laterality: N/A;  Requests for 3 hrs   AV FISTULA PLACEMENT Left ~ 2013   Av fistula removed Left ~ 2015   forearm   BACK SURGERY     CARPAL TUNNEL RELEASE Bilateral    CHOLECYSTECTOMY OPEN  2014   WITH LIVER TRANSPLANT   COLONOSCOPY     HERNIA REPAIR     LASIK     LIGATION OF ARTERIOVENOUS  FISTULA     pr ligath angioaccess av fistula   LIVER TRANSPLANT  2014   LUMBAR DISC SURGERY Left 12/01/2015   L3-L4 FAR LATERAL DISCECTOMY (WILTSE APPROACH   LUMBAR LAMINECTOMY/DECOMPRESSION MICRODISCECTOMY Left 12/01/2015   Procedure: LEFT L3-L4 FAR LATERAL DISCECTOMY (WILTSE APPROACH)     (1 LEVEL);  Surgeon: Donaciano Sprang, MD;  Location: [facility] OR;  Service: Orthopedics;  Laterality: Left;   NEPHRECTOMY RECIPIENT  2014   POSTERIOR LUMBAR FUSION  10/17/2016   Posterior fusion possible revision decompression L3-4/notes 10/17/2016   REFRACTIVE SURGERY Bilateral    UMBILICAL HERNIA REPAIR      Social History:   reports that he has never smoked. He has  never used smokeless tobacco. He reports that he does not drink alcohol and does not use drugs.  Allergies[1]  History reviewed. No pertinent family history.   Prior to Admission medications  Medication Sig Start Date End Date Taking? Authorizing Provider  allopurinol (ZYLOPRIM) 300 MG tablet Take 300 mg by mouth every evening.   Yes [provider]  furosemide (LASIX) 20 MG tablet Take 20 mg by mouth 2 (two) times daily.   Yes [provider]  JARDIANCE 10 MG TABS tablet Take 10 mg by mouth daily.   Yes [provider]  losartan (COZAAR) 50 MG tablet Take 50 mg by mouth daily. 08/02/23 08/01/24 Yes [provider]  mycophenolate  (MYFORTIC ) 180 MG EC  tablet Take 360 mg by mouth 2 (two) times daily.   Yes [provider]  tacrolimus  (PROGRAF ) 0.5 MG capsule Take 0.5 mg by mouth every evening. 11/24/19  Yes [provider]  tacrolimus  (PROGRAF ) 1 MG capsule Take 1 mg by mouth daily with breakfast.   Yes [provider]  doxycycline  (VIBRAMYCIN ) 100 MG capsule Take 1 capsule (100 mg total) by mouth 2 (two) times daily. Patient not taking: Reported on 06/18/2024 05/29/24   Johnie Flaming A, NP  gabapentin  (NEURONTIN ) 300 MG capsule Take 1 capsule (300 mg total) by mouth 3 (three) times daily. 10/19/16 09/14/19  [name], Dedra Maus, PA-C    Physical Exam: Vitals:   06/18/24 1821 06/19/24 0009  BP: 118/69   Pulse: 90   Resp: 20   Temp: 98.3 F (36.8 C) 98.9 F (37.2 C)  TempSrc: Oral Oral  SpO2: 99%      Constitutional: NAD, no pallor or diaphoresis   Eyes: PERTLA, lids and conjunctivae normal ENMT: Mucous membranes are moist. Posterior pharynx clear of any exudate or lesions.   Neck: supple, no masses  Respiratory: no wheezing, no crackles. No accessory muscle use.  Cardiovascular: S1 & S2 heard, regular rate and rhythm. Bilateral LE edema, L >> R.  Abdomen: No tenderness, soft. Bowel sounds active.  Musculoskeletal: no clubbing / cyanosis. No joint deformity upper and lower extremities.   Skin: Erythema, heat, tenderness, and induration involving lower left leg.  Warm, dry, well-perfused. Neurologic: CN 2-12 grossly intact. Moving all extremities. Alert and oriented.  Psychiatric: Calm. Cooperative.    Labs and Imaging on Admission: I have personally reviewed following labs and imaging studies  CBC: Recent Labs  Lab 06/18/24 1903  WBC 8.1  NEUTROABS 6.3  HGB 14.9  HCT 44.4  MCV 89.9  PLT 172   Basic Metabolic Panel: Recent Labs  Lab 06/18/24 1903  NA 137  K 3.3*  CL 100  CO2 23  GLUCOSE 187*  BUN 39*  CREATININE 1.88*  CALCIUM 9.0   GFR: CrCl cannot be calculated (Unknown  ideal weight.). Liver Function Tests: Recent Labs  Lab 06/18/24 1903  AST 18  ALT 19  ALKPHOS 117  BILITOT 1.0  PROT 7.1  ALBUMIN  3.5   No results for input(s): LIPASE, AMYLASE in the last 168 hours. No results for input(s): AMMONIA in the last 168 hours. Coagulation Profile: No results for input(s): INR, PROTIME in the last 168 hours. Cardiac Enzymes: No results for input(s): CKTOTAL, CKMB, CKMBINDEX, TROPONINI in the last 168 hours. BNP (last 3 results) No results for input(s): PROBNP in the last 8760 hours. HbA1C: No results for input(s): HGBA1C in the last 72 hours. CBG: No results for input(s): GLUCAP in the last 168 hours. Lipid Profile: No results  for input(s): CHOL, HDL, LDLCALC, TRIG, CHOLHDL, LDLDIRECT in the last 72 hours. Thyroid Function Tests: No results for input(s): TSH, T4TOTAL, FREET4, T3FREE, THYROIDAB in the last 72 hours. Anemia Panel: No results for input(s): VITAMINB12, FOLATE, FERRITIN, TIBC, IRON, RETICCTPCT in the last 72 hours. Urine analysis: No results found for: COLORURINE, APPEARANCEUR, LABSPEC, PHURINE, GLUCOSEU, HGBUR, BILIRUBINUR, KETONESUR, PROTEINUR, UROBILINOGEN, NITRITE, LEUKOCYTESUR Sepsis Labs: @LABRCNTIP (procalcitonin:4,lacticidven:4) )No results found for this or any previous visit (from the past 240 hours).   Radiological Exams on Admission: DG Chest 1 View Result Date: 06/18/2024 EXAM: 1 VIEW(S) XRAY OF THE CHEST 06/18/2024 11:39:00 PM COMPARISON: None available. CLINICAL HISTORY: fluid overload FINDINGS: LUNGS AND PLEURA: Lung volumes are small. No focal pulmonary opacity. No pleural effusion. No pneumothorax. HEART AND MEDIASTINUM: Mild cardiomegaly. Mediastinal widening likely related to semi-erect position and poor pulmonary insufflation. BONES AND SOFT TISSUES: No acute osseous abnormality. IMPRESSION: 1. Mild cardiomegaly. 2. Pulmonary  hypoinflation Electronically signed by: Dorethia Molt MD 06/18/2024 11:42 PM EST RP Workstation: [id number]    Assessment/Plan   1. LLE cellulitis  - Blood cultures collected and broad-spectrum empiric antibiotics started in ED  - Check venous Doppler of the LLE, continue broad-spectrum antibiotics, check CT if worsens or fails to improve as expected    2. AKI superimposed on CKD 3A; hx of kidney and liver transplant   - SCr is 1.88, up from 1.36 in late October  - Hold Lasix, losartan, and Jardiance, renally-dose medications, check UA and urine chemistries, check renal US  with Doppler, check tacrolimus  level, continue tacrolimus  and mycophenolate     3. PAF  - Not anticoagulated    DVT prophylaxis: Lovenox  Code Status: Full  Level of Care: Level of care: Med-Surg Family Communication: Wife updated from ED   Disposition Plan:  Patient is from: Home  Anticipated d/c is to: Home  Anticipated d/c date is: 06/22/24  Patient currently: Pending venous Doppler US , renal US , clinical improvement in cellulitis, stable renal function  Consults called: None  Admission status: Inpatient     Evalene GORMAN Sprinkles, MD [facility]  06/19/2024, 12:50 AM       [1]  Allergies Allergen Reactions   Cephalosporins Other (See Comments)    Liver/Kidney failure    Zyvox [Linezolid] Nausea Only and Other (See Comments)    Liver/Kidney failure.    "

## 2024-06-19 NOTE — Progress Notes (Signed)
 Lengthy discussion had with both patient and wife at the bedside.  They both are in agreement with the hospital home program with tentative plan for transfer early in the morning From a medical standpoint, case was tentatively discussed with both Dr. Vassie as well as Dr. Jinny Stank with infectious disease. Will plan to transition patient to Unasyn as well as daptomycin for infectious coverage/avoid any secondary renal injury in the setting of prior renal transplant and stage III CKD with acute decompensation on presentation Will check CT of the lower extremity to rule out any type of deep abscess given prolonged infection. 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 Will follow-up on CT scan as well as formal recommendations from Dr. Vassie Virtual consultation available for Dr. Stank if needed tomorrow Plan of care discussed at length with family at the bedside ----------------------------------------------------------------------------------------------------- Hospital at Home Admission Criteria Checklist:  Formal consent explained in detail and signed at the bedside: yesAsthma Patient meets inpatient admission criteria (see below for further details) yes Is pt Medicare FFS/[employer] Medicare-Medicaid, [employer], [employer] Medicare, [employer], [employer] ( required for initial launch with plan to expand)? yes Lives within 25 mil/ 30 min from [facility] within [place](pt may stay with family member during admission who lives within 25 miles or 30 min from [facility] w/in [facility])? yes Hemodynamically stable with relatively low risk of clinical deterioration-not requiring ICU? yes Age >55? yes Does not require frequent touch-points or complex interventions/medications (ie Titrated Infusions (IV insulin, heparin drips, vasoactive drips, use of infused  or injectable controlled substances, patients on insulin)? no Any Behavioral Health comorbidities likely to increase risk for in-home care (ie Acute delirium or experiencing a marked altered mental status and cause is not a treatable condition in the home)? no Has the patient been on BIPAP during course of ED evaluation or hospitalization? no IF YES, Has the patient been off of BIPAP for >24 hours(If NO-THEN PATIENT DOES MEET INCLUSION CRITERIA)? not applicable On Room Air or Needs oxygen at home (<6L)? is not on home oxygen therapy. Active safety concerns (ie Unable to use bedside commode independently and lacks caregiver support for safety- needs SNF placement, unable to obtain IV access)? no Has skin check been performed? no pending prior to transfer to program in am Has Physical Therapy screened the patient? no- pending   Common admission diagnoses including: CAP, COPD Exacerbation, Acute on chronic heart failure, Cellulitis, UTI , dehydration, acute resp failure with hypoxia (requiring <5L)   Social Screening:  - Has the family been directly contacted about Hospital at Home program with consent obtained (if yes- please document who was spoken to with name and phone number)? yes  -Was the family approached about the use of TOC pharmacy for medications at discharge? no Denies significant ETOH intake? not applicable Does not smoke and understands may not smoke in the presence of oxygen? not applicable Patient states able to use iPad/phone for communication/has family who is able to use? yes Patient has agreed to be compliant with medication and treatment regimen of the program? yes Any active drug use in patient or primary caregiver including daily dosing of methadone? no Stable home environment ( access to appropriate heating in cold conditions and/or appropriate air conditioning in hot conditions and/or no running water/electricity)? yes  No aggressive pets at home? no Firearm present? no   With ability  or willingness to store them unloaded in a locked case for duration of hospitalization? not applicable Ambulatory? yes  mild difficulty Bed bugs present on home evaluation? no Family support system in place? yes Patient feels safe at home and does not endorse any violence? yes Any actively decompensated behavioral health issues including agitation/aggressive behavior? no  Patient requests food to be provided by hospital home program? yes PT/OT eval completed and not requiring SNF, ALF, inpatient rehab? no- pending   To be admitted to the [facility] program, a patient generally must meet the following: 1. Requirement for Inpatient Level of Care: The patient's condition must necessitate an inpatient level of care. This is typically indicated by one or more of the following, depending on their specific diagnosis:  Persistent tachycardia despite appropriate treatment (e.g., for Heart Failure, UTI). Persistent tachypnea (rapid breathing) or dyspnea (shortness of breath) that hasn't improved sufficiently with observation care (e.g., for Heart Failure, Pneumonia, Viral Illness, COVID). Hypoxemia (low oxygen levels), such as a new need for oxygen, an increased need from baseline, or specific oxygen saturation levels (e.g., SpO2 <90-94% depending on the condition) that persist despite observation (e.g., for Heart Failure, COPD, Pneumonia, Viral Illness, COVID). Need for Intravenous (IV) hydration due to an inability to maintain oral hydration, which persists despite observation care (e.g., for Cellulitis, UTI, Viral Illness, COVID). Specific to Heart Failure: Persistent pulmonary edema, indicated by a new oxygen need, lack of improvement with IV diuretics, and ongoing tachypnea/dyspnea. Specific to COPD: A decrease in known baseline resting oxygen saturation (SpO2) by 4% or more, or an increase in pre-existing supplemental oxygen requirements, which persists despite observation and  requires continued close monitoring. Specific to Pneumonia: A Pneumonia Severity Index (PSI) class IV (moderate risk). Specific to Cellulitis: Failure of outpatient antibiotic therapy (indicated by progression or no improvement after a minimum of 48 hours on an adequate regimen) or a clinical presentation (like acuity or rapidity of progression) that requires the intensity of monitoring found in an inpatient setting. Specific to UTI: Persistence or worsening of clinical findings like fever, pain, or dehydration despite observation care; presence of significant uropathy; suspected infection of an indwelling prosthetic device, stent, implant, or graft; or pregnancy with suspected pyelonephritis.  2. Appropriateness for Hospital at Home Setting: The patient's overall clinical picture, including the severity of their illness, their care needs, and their medical history and comorbidities, must be suitable for management in the Hospital at Home environment. This essentially means that none of the exclusion criteria (listed below) are met.  Unified Exclusion Criteria for Hospital at Home Admission: A patient would not be eligible for Hospital at Home if any of the following are present: Hemodynamic Instability: Hypotension (low blood pressure) is present. Respiratory Instability or Needs Beyond Program Capability: There is a new need for invasive or noninvasive ventilatory assistance (like BiPAP or a ventilator). Oxygenation is not sufficient, generally indicated if an FiO2 (fraction of inspired oxygen) of 45% (which is about 6 Liters/minute via nasal cannula) or more is required to keep oxygen saturation (SpO2) at 90% or greater. Monitoring or Procedural Needs Beyond Program Capability: There is a need for invasive monitoring, such as a pulmonary artery catheter or an arterial line. There is a need for immediate-response telemetry monitoring (for dangerous arrhythmia detection and subsequent immediate  intervention). The required medication regimen is beyond the capabilities of Hospital at Home (e.g., dosing intervals are too frequent for home administration). There is a need for a procedure that cannot be performed  by the Hospital at [facility] team (e.g., significant wound debridement or abscess drainage for cellulitis, or percutaneous nephrostomy for a complicated UTI). Significant Organ Dysfunction or Markers of Severe Illness: Mental status is not at baseline, or there is altered mental status suggestive of inadequate perfusion. Renal (kidney) function is unstable or showing an ongoing decline. There is evidence of inadequate perfusion, such as metabolic acidosis or myocardial ischemia. Uncompensated acidosis is present. Condition-Specific Severity or Complications Making Home Care Unsuitable: For Heart Failure: Known severe cardiac valvular disease (e.g., aortic stenosis, mitral regurgitation); or severe peripheral edema that impairs the ability to urinate or ambulate. For COPD: Known concurrent comorbidity or finding that indicates a higher-risk COPD exacerbation (e.g., pulmonary fibrosis, cavitation, pleural effusion, pneumothorax, rib fracture). For Pneumonia: Pneumonia Severity Index (PSI) class V (indicating high risk for inpatient mortality); known concurrent comorbidity or finding that indicates higher-risk pneumonia (e.g., pulmonary fibrosis, cavitation, large or loculated pleural effusion); or a concomitant serious infectious process like endocarditis or empyema. For Cellulitis: Orbital, periorbital, or necrotizing infection is suspected; or a concomitant serious infectious process like endocarditis, septic emboli, or septic joint space infection. For UTI: Urinary tract obstruction (e.g., kidney stone, bladder outlet obstruction); or a concomitant serious infectious process like endocarditis or septic emboli. For Viral Illness & COVID-19: A concomitant serious infectious process like  endocarditis or empyema.  General Comorbidities or Status:  The patient is significantly immunosuppressed (this applies to Pneumonia, Cellulitis, UTI, Viral Illness, and COVID-19). The patient meets inpatient admission criteria for a second diagnosis, or has care needs beyond the capabilities of Hospital at Home due to an active clinically significant comorbidity. (This is a general exclusion across all listed conditions).

## 2024-06-19 NOTE — Consult Note (Addendum)
 Renal Service Consult Note [facility] [name] JONETTA Fret, MD  Patient: [name] [name] Date: 06/19/2024 Requesting Physician: Dr. Jillian  Reason for Consult: Renal transplant patient w/ cellulitis HPI: The patient is a 78 y.o. year-old w/ PMH as below who presented to ED yesterday c/o L leg redness and swelling. In ED BP was 128/ 70, HR 90, RR 20, temop 98.2. 95% sat on RA, K+ 3.3, creat 1.8, normal CBC and lactate. Blood cx's were sent and pt started on IV vanc/ levaquin . In ED pt rec'd IV vanc, IV levaquin . Today he rec'd IV meropenem and IV vancomycin . BP's were 140/80. We are asked to see for renal / transplant attention.    Pt seen in room. Notes L leg swelling main problem. Notes liver+ kidney combined transplant in 2014, his renal MD is Dr Macie at [facility]. His last creat 1 mo ago was 1.6.  His myfortic  was recently upped to 360mg  bid, and the prograf  remains at 1 mg qam/ 0.5mg  qpm.   No SOB. CXR showed no infiltrates/ edema.    ROS - denies CP, no joint pain, no HA, no blurry vision, no rash, no diarrhea, no nausea/ vomiting   Past Medical History  Past Medical History:  Diagnosis Date   A-fib (HCC)    post-operative afib 12/01/15   Arthritis    spine, hips (10/17/2016)   Benign neoplasm of colon    Chronic lower back pain    End stage liver disease (HCC)    End stage renal disease (HCC) 2014   Had transplant; stopped dialysis 2014   Gout    History of blood transfusion    w/my transplants   History of kidney stones    was in the new kidney has passed   IgA nephropathy    Kidney transplant recipient    Liver transplant recipient [facility])    NASH (nonalcoholic steatohepatitis)    Neuromuscular disorder (HCC)    Obesity    Past Surgical History  Past Surgical History:  Procedure Laterality Date   ANTERIOR LAT LUMBAR FUSION N/A 10/17/2016   Procedure: XLIF L3-4;  Surgeon: Donaciano Sprang, MD;  Location: [facility] OR;  Service: Orthopedics;  Laterality: N/A;  Requests  for 3 hrs   AV FISTULA PLACEMENT Left ~ 2013   Av fistula removed Left ~ 2015   forearm   BACK SURGERY     CARPAL TUNNEL RELEASE Bilateral    CHOLECYSTECTOMY OPEN  2014   WITH LIVER TRANSPLANT   COLONOSCOPY     HERNIA REPAIR     LASIK     LIGATION OF ARTERIOVENOUS  FISTULA     pr ligath angioaccess av fistula   LIVER TRANSPLANT  2014   LUMBAR DISC SURGERY Left 12/01/2015   L3-L4 FAR LATERAL DISCECTOMY (WILTSE APPROACH   LUMBAR LAMINECTOMY/DECOMPRESSION MICRODISCECTOMY Left 12/01/2015   Procedure: LEFT L3-L4 FAR LATERAL DISCECTOMY (WILTSE APPROACH)     (1 LEVEL);  Surgeon: Donaciano Sprang, MD;  Location: [facility] OR;  Service: Orthopedics;  Laterality: Left;   NEPHRECTOMY RECIPIENT  2014   POSTERIOR LUMBAR FUSION  10/17/2016   Posterior fusion possible revision decompression L3-4/notes 10/17/2016   REFRACTIVE SURGERY Bilateral    UMBILICAL HERNIA REPAIR     Family History History reviewed. No pertinent family history. Social History  reports that he has never smoked. He has never used smokeless tobacco. He reports that he does not drink alcohol and does not use drugs. Allergies Allergies[1] Home medications Prior to Admission medications  Medication Sig Start Date End Date Taking? Authorizing Provider  allopurinol (ZYLOPRIM) 300 MG tablet Take 300 mg by mouth every evening.   Yes [provider]  furosemide (LASIX) 20 MG tablet Take 20 mg by mouth 2 (two) times daily.   Yes [provider]  JARDIANCE 10 MG TABS tablet Take 10 mg by mouth daily.   Yes [provider]  losartan (COZAAR) 50 MG tablet Take 50 mg by mouth daily. 08/02/23 08/01/24 Yes [provider]  mycophenolate  (MYFORTIC ) 180 MG EC tablet Take 360 mg by mouth 2 (two) times daily.   Yes [provider]  tacrolimus  (PROGRAF ) 0.5 MG capsule Take 0.5 mg by mouth every evening. 11/24/19  Yes [provider]  tacrolimus  (PROGRAF ) 1 MG capsule Take 1 mg by mouth daily with breakfast.    Yes [provider]  doxycycline  (VIBRAMYCIN ) 100 MG capsule Take 1 capsule (100 mg total) by mouth 2 (two) times daily. Patient not taking: Reported on 06/18/2024 05/29/24   Johnie Flaming A, NP  gabapentin  (NEURONTIN ) 300 MG capsule Take 1 capsule (300 mg total) by mouth 3 (three) times daily. 10/19/16 09/14/19  [name], Dedra Maus, PA-C     Vitals:   06/19/24 0058 06/19/24 0515 06/19/24 0915 06/19/24 1311  BP: (!) 148/83 (!) 156/79 (!) 145/68 (!) 153/67  Pulse: 100 (!) 103 93 96  Resp: 18 18 16 15   Temp: 97.8 F (36.6 C) 97.9 F (36.6 C) 97.8 F (36.6 C) 97.9 F (36.6 C)  TempSrc:  Oral Oral Oral  SpO2: 100% 95% 95% 98%  Weight:      Height:       Exam Gen alert, no distress, large framed WM Sclera anicteric, throat clear  No jvd or bruits Chest clear bilat to bases RRR no MRG Abd soft ntnd no mass or ascites +bs Ext marked LLE pretibe edema 2-3+, RLE 1-2+ edema, left lower leg bright red, right lower leg is dark red  Neuro is alert, Ox 3 , nf   Home bp meds: Lasix Jardiance Losartan Myfortic  Prograf       Assessment/ Plan: Renal failure, transplant: not sure baseline, patient says it was 1.6 when checked 1 mo ago at [facility] w/ his renal physician Dr Macie. Here the creat is 1.8. Pt has lower leg cellulitis, but no hypotension and no IV contrast, acei/ARB, no other nephrotoxins. Except potentially IV vanc. Was on lasix at home. Volume difficult to estimate given body habitus but suspect he is a bit dry. Has not rec'd much IVF's. Will give 750 NS bolus and run IVFs at 100 cc/hr x 18 hrs. Cont his IS meds as ordered. Try to find a substitute for IV vancomycin . No other suggestions. Will follow.  LLE cellulitis: acute on chronic bilat LE edema/ venous insufficiency. Getting IV abx.  H/o kidney/ liver transplant: in 2014 Obesity        Rob Geralynn  MD [facility] 06/19/2024, 4:03 PM  Recent Labs  Lab 06/18/24 1903 06/19/24 0344  CREATININE 1.88* 1.85*   K 3.3* 3.2*   Inpatient medications:  [START ON 06/20/2024] heparin injection (subcutaneous)  5,000 Units Subcutaneous Q8H   mycophenolate   360 mg Oral BID   tacrolimus   0.5 mg Oral QPM   tacrolimus   1 mg Oral Q breakfast    ampicillin-sulbactam (UNASYN) IV 3 g (06/19/24 1211)   acetaminophen  **OR** acetaminophen , HYDROmorphone  (DILAUDID ) injection, oxyCODONE , prochlorperazine, senna      [1]  Allergies Allergen Reactions   Cephalosporins Other (  See Comments)    Liver/Kidney failure    Zyvox [Linezolid] Nausea Only and Other (See Comments)    Liver/Kidney failure.

## 2024-06-19 NOTE — Progress Notes (Signed)
 " PROGRESS NOTE  SENDER RUEB  FMW:[id number] DOB: 06-13-1946 DOA: 06/18/2024 PCP: Macie Benedetta POUR, MD   Brief Narrative: Patient is a 78 year old male with history of hypertension, gout, atrial fibrillation not on anticoagulation, IgA nephropathy followed by ESRD status postrenal transplant, cirrhosis status post liver transplant, native right kidney renal cell carcinoma status post cryoablation, CKD stage III who presented with complaint of left lower extremity redness, swelling.  Redness/swelling of the left lower extremity ongoing for 2 weeks, finished 10 days course of doxycycline  without any improvement.  Also reported fever at home.  On presentation, he was afebrile, hemodynamically stable.  Lab work showed potassium 3.3, creatinine of 1.8.  Patient admitted for further management of left lower extremity cellulitis, AKI.  Since kidney function did not improve significantly, we have also consulted nephrology.  Assessment & Plan:  Principal Problem:   Left leg cellulitis Active Problems:   PAF (paroxysmal atrial fibrillation) (HCC)   Renal transplant, status post   Immunosuppressed status   Liver transplant recipient [facility])   Acute renal failure superimposed on stage 3a chronic kidney disease (HCC)   Left lower extremity cellulitis: Presented with redness, swelling of left lower extremity, fever.  Has bilateral chronic venous stasis changes.  His legs are always red.  Redness worsened on the left lower extremity.  Venous Doppler pending.  Continue current antibiotics.  Follow-up cultures.  afebrile this morning. Takes Lasix for chronic bilateral lower EXTR edema, currently on hold.  AKI on CKD stage IIIa/history of kidney and liver transplant: Baseline creatinine around 1.3.  Presented with creatinine of 1.8.  Takes losartan , Lasix, Jardiance  at home.  Pending renal ultrasound.  Takes immunosuppressants, tacrolimus /mycophenolate .  Consulted  nephrology.  Kidney function remains stable  like yesterday.  He follows with nephrology at [facility]  Paroxysmal A-fib: Currently in normal sinus rhythm.  Not on anticoagulation at baseline, not on beta-blocker.  Patient says he has skipped beats.  Hypokalemia: Supplemented  with potassium.  Morbid obesity: BMI 40.3        DVT prophylaxis:Subcu heparin      Code Status: Full Code  Family Communication: Wife at bedside  Patient status:Inpatient  Patient is from :Home  Anticipated discharge un:ynfz  Estimated DC date: After improvement in the kidney function, lower extremity cellulitis   Consultants: Nephrology  Procedures: None  Antimicrobials:  Anti-infectives (From admission, onward)    Start     Dose/Rate Route Frequency Ordered Stop   06/19/24 0300  meropenem  (MERREM ) 1 g in sodium chloride  0.9 % 100 mL IVPB        1 g 200 mL/hr over 30 Minutes Intravenous Every 12 hours 06/19/24 0118     06/19/24 0200  vancomycin  (VANCOREADY) IVPB 1500 mg/300 mL        1,500 mg 150 mL/hr over 120 Minutes Intravenous  Once 06/19/24 0106 06/19/24 0324   06/18/24 2200  vancomycin  (VANCOCIN ) IVPB 1000 mg/200 mL premix        1,000 mg 200 mL/hr over 60 Minutes Intravenous  Once 06/18/24 2159 06/19/24 0041   06/18/24 2200  levofloxacin  (LEVAQUIN ) IVPB 750 mg        750 mg 100 mL/hr over 90 Minutes Intravenous  Once 06/18/24 2159 06/19/24 0044       Subjective: Patient seen and examined at bedside today.  Hemodynamically stable and afebrile.  Lying in bed.  Overall appears comfortable.  Bilateral lower extremity redness present.  Redness on the right lower extremity is at baseline.  He has chronic  venous stasis changes.  Left lower extremity appears more erythematous, edematous.  Objective: Vitals:   06/18/24 1821 06/19/24 0009 06/19/24 0058 06/19/24 0515  BP: 118/69  (!) 148/83 (!) 156/79  Pulse: 90  100 (!) 103  Resp: 20  18 18   Temp: 98.3 F (36.8 C) 98.9 F (37.2 C) 97.8 F (36.6 C) 97.9 F (36.6 C)  TempSrc: Oral  Oral  Oral  SpO2: 99%  100% 95%  Weight:  135.1 kg    Height:  6' (1.829 m)      Intake/Output Summary (Last 24 hours) at 06/19/2024 0741 Last data filed at 06/19/2024 0500 Gross per 24 hour  Intake 400 ml  Output --  Net 400 ml   Filed Weights   06/19/24 0009  Weight: 135.1 kg    Examination:  General exam: Overall comfortable, not in distress, morbidly obese HEENT: PERRL Respiratory system:  no wheezes or crackles  Cardiovascular system: S1 & S2 heard, RRR.  Gastrointestinal system: Abdomen is nondistended, soft and nontender. Central nervous system: Alert and oriented Extremities: Bilateral lower extremity venous stasis disease, erythematous lower extremities, cellulitis of the left lower extremity with edema Skin: No rashes, no ulcers,no icterus     Data Reviewed: I have personally reviewed following labs and imaging studies  CBC: Recent Labs  Lab 06/18/24 1903 06/19/24 0344  WBC 8.1 6.2  NEUTROABS 6.3  --   HGB 14.9 13.4  HCT 44.4 40.2  MCV 89.9 90.3  PLT 172 143*   Basic Metabolic Panel: Recent Labs  Lab 06/18/24 1903 06/19/24 0344  NA 137 139  K 3.3* 3.2*  CL 100 101  CO2 23 24  GLUCOSE 187* 134*  BUN 39* 38*  CREATININE 1.88* 1.85*  CALCIUM 9.0 8.6*  MG  --  2.2     No results found for this or any previous visit (from the past 240 hours).   Radiology Studies: DG Chest 1 View Result Date: 06/18/2024 EXAM: 1 VIEW(S) XRAY OF THE CHEST 06/18/2024 11:39:00 PM COMPARISON: None available. CLINICAL HISTORY: fluid overload FINDINGS: LUNGS AND PLEURA: Lung volumes are small. No focal pulmonary opacity. No pleural effusion. No pneumothorax. HEART AND MEDIASTINUM: Mild cardiomegaly. Mediastinal widening likely related to semi-erect position and poor pulmonary insufflation. BONES AND SOFT TISSUES: No acute osseous abnormality. IMPRESSION: 1. Mild cardiomegaly. 2. Pulmonary hypoinflation Electronically signed by: Dorethia Molt MD 06/18/2024 11:42 PM EST RP  Workstation: [id number]    Scheduled Meds:  enoxaparin  (LOVENOX ) injection  65 mg Subcutaneous Q24H   mycophenolate   360 mg Oral BID   tacrolimus   0.5 mg Oral QPM   tacrolimus   1 mg Oral Q breakfast   Continuous Infusions:  meropenem  (MERREM ) IV 1 g (06/19/24 0338)     LOS: 1 day   Ivonne Mustache, MD [facility] P12/19/2025, 7:41 AM  "

## 2024-06-19 NOTE — Progress Notes (Signed)
 Pharmacy Antibiotic Note  [name] [name] is a 78 y.o. male admitted on 06/18/2024 with cellulitis.  Pharmacy has been consulted for Vancomycin  and Meropenem  dosing.  In the ED received Vancomycin  1g IV and Levofloxacin  750mg  IV x 1 dose each  Plan: Give an additional Vancomycin  1500mg  IV x 1 for total loading dose of 2500mg  IV Vancomycin  1250 mg IV Q 24 hrs. Goal AUC 400-550.  Expected AUC: 530.9  SCr used: 1.88 Meropenem  1g IV q12h Follow renal function F/u culture results & sensitivities  Height: 6' (182.9 cm) Weight: 135.1 kg (297 lb 13.5 oz) IBW/kg (Calculated) : 77.6  Temp (24hrs), Avg:98.2 F (36.8 C), Min:97.7 F (36.5 C), Max:98.9 F (37.2 C)  Recent Labs  Lab 06/18/24 1903 06/18/24 2305  WBC 8.1  --   CREATININE 1.88*  --   LATICACIDVEN  --  1.3    Estimated Creatinine Clearance: 47.6 mL/min (A) (by C-G formula based on SCr of 1.88 mg/dL (H)).    Allergies[1]  Antimicrobials this admission: 12/18 Levofloxacin  x 1 12/18 Vancomycin  >>   12/19 Meropenem  >>  Dose adjustments this admission:    Microbiology results: 12/18 BCx:      Thank you for allowing pharmacy to be a part of this patients care.  Arvin Gauss, PharmD 06/19/2024 1:13 AM     [1]  Allergies Allergen Reactions   Cephalosporins Other (See Comments)    Liver/Kidney failure    Zyvox [Linezolid] Nausea Only and Other (See Comments)    Liver/Kidney failure.

## 2024-06-20 ENCOUNTER — Inpatient Hospital Stay (HOSPITAL_COMMUNITY)

## 2024-06-20 DIAGNOSIS — L03116 Cellulitis of left lower limb: Secondary | ICD-10-CM | POA: Diagnosis not present

## 2024-06-20 DIAGNOSIS — R7881 Bacteremia: Secondary | ICD-10-CM | POA: Diagnosis not present

## 2024-06-20 DIAGNOSIS — I890 Lymphedema, not elsewhere classified: Secondary | ICD-10-CM

## 2024-06-20 LAB — UREA NITROGEN, URINE: Urea Nitrogen, Ur: 708 mg/dL

## 2024-06-20 LAB — BLOOD CULTURE ID PANEL (REFLEXED) - BCID2

## 2024-06-20 LAB — CBC
HCT: 39.2 % (ref 39.0–52.0)
Hemoglobin: 13.1 g/dL (ref 13.0–17.0)
MCH: 29.8 pg (ref 26.0–34.0)
MCHC: 33.4 g/dL (ref 30.0–36.0)
MCV: 89.1 fL (ref 80.0–100.0)
Platelets: 157 K/uL (ref 150–400)
RBC: 4.4 MIL/uL (ref 4.22–5.81)
RDW: 13.5 % (ref 11.5–15.5)
WBC: 5.1 K/uL (ref 4.0–10.5)
nRBC: 0 % (ref 0.0–0.2)

## 2024-06-20 LAB — ECHOCARDIOGRAM COMPLETE
AR max vel: 2.63 cm2
AV Area VTI: 3.08 cm2
AV Area mean vel: 2.62 cm2
AV Mean grad: 7 mmHg
AV Peak grad: 12.7 mmHg
Ao pk vel: 1.78 m/s
Area-P 1/2: 4.89 cm2
Calc EF: 61.1 %
Height: 72 in
P 1/2 time: 445 ms
S' Lateral: 4 cm
Single Plane A2C EF: 62.7 %
Single Plane A4C EF: 61.4 %
Weight: 4765.46 [oz_av]

## 2024-06-20 LAB — BASIC METABOLIC PANEL WITH GFR
Anion gap: 15 (ref 5–15)
BUN: 28 mg/dL — ABNORMAL HIGH (ref 8–23)
CO2: 21 mmol/L — ABNORMAL LOW (ref 22–32)
Calcium: 8.4 mg/dL — ABNORMAL LOW (ref 8.9–10.3)
Chloride: 106 mmol/L (ref 98–111)
Creatinine, Ser: 1.73 mg/dL — ABNORMAL HIGH (ref 0.61–1.24)
GFR, Estimated: 40 mL/min — ABNORMAL LOW
Glucose, Bld: 145 mg/dL — ABNORMAL HIGH (ref 70–99)
Potassium: 3.3 mmol/L — ABNORMAL LOW (ref 3.5–5.1)
Sodium: 142 mmol/L (ref 135–145)

## 2024-06-20 LAB — CK: Total CK: 65 U/L (ref 49–397)

## 2024-06-20 LAB — MAGNESIUM: Magnesium: 2.1 mg/dL (ref 1.7–2.4)

## 2024-06-20 MED ORDER — OXYCODONE HCL 5 MG PO TABS
5.0000 mg | ORAL_TABLET | Freq: Four times a day (QID) | ORAL | Status: DC | PRN
  Filled 2024-06-20 (×16): qty 1

## 2024-06-20 MED ORDER — POTASSIUM CHLORIDE CRYS ER 20 MEQ PO TBCR: 30.0000 meq | EXTENDED_RELEASE_TABLET | Freq: Once | ORAL | Status: DC

## 2024-06-20 MED ORDER — SODIUM CHLORIDE 0.9 % IV BOLUS: 500.0000 mL | Freq: Once | INTRAVENOUS | Status: DC

## 2024-06-20 MED ORDER — POTASSIUM CHLORIDE CRYS ER 20 MEQ PO TBCR
40.0000 meq | EXTENDED_RELEASE_TABLET | Freq: Once | ORAL | Status: AC
  Administered 2024-06-20: 40 meq via ORAL
  Filled 2024-06-20: qty 2

## 2024-06-20 MED ORDER — SODIUM CHLORIDE 0.9 % IV SOLN: INTRAVENOUS | Status: AC

## 2024-06-20 MED ORDER — LEVOFLOXACIN 250 MG PO TABS
250.0000 mg | ORAL_TABLET | Freq: Once | ORAL | Status: DC
  Filled 2024-06-20 (×2): qty 1

## 2024-06-20 MED ORDER — OXYCODONE HCL 5 MG PO TABS: 5.0000 mg | ORAL_TABLET | Freq: Four times a day (QID) | ORAL | 0 refills | Status: AC | PRN

## 2024-06-20 NOTE — Consult Note (Signed)
 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  Patient states he gets cellulitis in lower extremity on and off.  He has been doing yard work and shorts and has gotten some degrees in his lower extremities.  ID engaged for antibiotic recommendations #Left lower extremity cellulitis #Reported history of renal failure with linezolid and cephalosporins - On arrival patient afebrile no leukocytosis.  No improvement Doxy outpatient x 10 days.  CT showed diffuse soft tissue edema extending to lower leg into ankle and dorsum of foot.  No fluid collection. 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  He is amenable to starting cephalosporin.  Okay to switch to ceftriaxone . Plan: - Continue daptomycin , switch Unasyn  to ceftriaxone  - Continued local wound care #Blood culture with [facility] - 2 out of 2 blood cultures with [facility] on from admission, BC ID shows MRSE - Unclear if this is contamination will need to wait for sensitivities. - TTE - Repeat blood cultures   -standard precautions -Communicated plan with primary  Microbiology:   Antibiotics: Dapto and unasyn   Cultures: Blood 12/18 bcid stph epi with [facility]  2/2 bottles Urine  Other   HPI: SHEY BARTMESS is a 78 y.o. male with past medical combined liver and kidney transplant on 2//2014.  He has end-stage liver disease secondary to [facility], renal failure was secondary to IgA nephropathy,, but gross hematuria found to have small stone lower pole of transplant kidney, obesity, Hypertension, venous stasis, A-fib not on [facility] presented with 2-week history of redness and swelling involving left lower leg.  Patient states he has been doing yard work and has gotten some debris's in his legs while cutting grass and shorts.  He was given doxycycline  outpatient without improvement.  Presented with chills malaise, afebrile on admission no leukocytosis.  Initially started on vancomycin  and switched to Dapto due to concern of kidney damage.  Patient currently daptomycin  and Unasyn .  ID engaged for antibiotic recommendations as he is wanting to go home.   Review of Systems: Review of Systems  All other systems reviewed and are negative.   Past Medical History:  Diagnosis Date   A-fib [facility])    post-operative afib 12/01/15   Arthritis    spine, hips (10/17/2016)   Benign neoplasm of colon    Chronic lower back pain    End stage liver disease (HCC)    End stage renal disease (HCC) 2014   Had transplant; stopped dialysis 2014   Gout    History of blood transfusion  w/my transplants   History of kidney stones    was in the new kidney has passed   IgA nephropathy    Kidney transplant recipient    Liver transplant recipient [facility])    NASH (nonalcoholic steatohepatitis)    Neuromuscular disorder (HCC)    Obesity     Social History[1]  History reviewed. No pertinent family history. Scheduled Meds:  heparin  injection (subcutaneous)  5,000 Units Subcutaneous Q8H   levofloxacin   250 mg Oral Once    mycophenolate   360 mg Oral BID   potassium chloride   30 mEq Oral Once   tacrolimus   0.5 mg Oral QPM   tacrolimus   1 mg Oral Q breakfast   Continuous Infusions:  sodium chloride  Stopped (06/20/24 1600)   DAPTOmycin  500 mg (06/20/24 1428)   sodium chloride      PRN Meds:.acetaminophen  **OR** [DISCONTINUED] acetaminophen , oxyCODONE , senna Allergies[2]  OBJECTIVE: Blood pressure (!) 173/78, pulse 92, temperature (!) 96.1 F (35.6 C), temperature source Oral, resp. rate 18, height 6' (1.829 m), weight 135.1 kg, SpO2 96%.  Physical Exam Constitutional:      General: He is not in acute distress.    Appearance: He is normal weight. He is not toxic-appearing.  HENT:     Head: Normocephalic and atraumatic.     Right Ear: External ear normal.     Left Ear: External ear normal.     Nose: No congestion or rhinorrhea.     Mouth/Throat:     Mouth: Mucous membranes are moist.     Pharynx: Oropharynx is clear.  Eyes:     Extraocular Movements: Extraocular movements intact.     Conjunctiva/sclera: Conjunctivae normal.     Pupils: Pupils are equal, round, and reactive to light.  Cardiovascular:     Rate and Rhythm: Normal rate and regular rhythm.     Heart sounds: No murmur heard.    No friction rub. No gallop.  Pulmonary:     Effort: Pulmonary effort is normal.     Breath sounds: Normal breath sounds.  Abdominal:     General: Abdomen is flat. Bowel sounds are normal.     Palpations: Abdomen is soft.  Musculoskeletal:     Cervical back: Normal range of motion and neck supple.     Comments: Le wounds  Skin:    General: Skin is warm and dry.  Neurological:     General: No focal deficit present.     Mental Status: He is oriented to person, place, and time.  Psychiatric:        Mood and Affect: Mood normal.     Lab Results Lab Results  Component Value Date   WBC 5.1 06/20/2024   HGB 13.1 06/20/2024   HCT 39.2 06/20/2024   MCV 89.1 06/20/2024   PLT 157 06/20/2024    Lab  Results  Component Value Date   CREATININE 1.73 (H) 06/20/2024   BUN 28 (H) 06/20/2024   NA 142 06/20/2024   K 3.3 (L) 06/20/2024   CL 106 06/20/2024   CO2 21 (L) 06/20/2024    Lab Results  Component Value Date   ALT 14 06/19/2024   AST 15 06/19/2024   ALKPHOS 94 06/19/2024   BILITOT 0.7 06/19/2024       Loney Stank, MD [facility] 06/20/2024, 10:32 PM Evaluation of this patient requires complex antimicrobial therapy evaluation and counseling + isolation needs for disease transmission risk assessment and mitigation      [  1]  Social History Tobacco Use   Smoking status: Never   Smokeless tobacco: Never  Substance Use Topics   Alcohol use: No   Drug use: No  [2]  Allergies Allergen Reactions   Cephalosporins Other (See Comments)    Liver/Kidney failure    Zyvox [Linezolid] Nausea Only and Other (See Comments)    Liver/Kidney failure.    "

## 2024-06-20 NOTE — Plan of Care (Addendum)
 " Hospital at Home Interim Note    [name] [name]   FMW:[id number]  DOB: 1946/06/19  DOA: 06/18/2024     2 Date of Service: 06/20/2024     Subjective:  Blood cultures noted for staph epidermidis 2/2 sets- ID consulted. Repeat blood cultures drawn. 2D ECHO ordered. Antibiotics changed to rocephin  and daptomycin  pending repeat blood cultures. LLE swelling symptomatically improving. Pt agreeable to hospital at home program 06/19/24.   Hospital Problems Assessment and Plan: Left lower extremity cellulitis/gram-positive bacteremia: Presented with redness, swelling of left lower extremity, fever.  Has bilateral chronic venous stasis changes.  His legs are always red.  Redness worsened on the left lower extremity.  Venous Doppler did not show any DVT.  CT of the left lower extremity did not show any abscess or fluid collection.  Blood cultures showing gram-positive cocci/Staph epidermidis.  Likely contamination but could not rule out true infection.  ID also consulted (Dr. Dennise)   Currently on daptomycin , Levaquin --> Transitioned to rocephin  and daptomycin  (holding vancomycin  with concern for renal injury)    2D echo ordered-pending    Repeat blood cultures ordered. Dr. Harden also saw him for his bilateral chronic lymphedema- plan for outpatient follow up at lymphedema clinic.  Takes Lasix for chronic bilateral lower EXTR edema, currently on hold Tentative plan for Unna boot placement    AKI on CKD stage IIIa/history of kidney and liver transplant: Baseline creatinine around 1.3.  Presented with creatinine of 1.8.  Takes losartan , Lasix, Jardiance  at home.  No hydronephrosis or any other problem as per renal ultrasound.  Takes immunosuppressants, tacrolimus /mycophenolate .  Nephrology following. He follows with nephrology at [facility].  Creatinine has trended down to 1.73 today Continue with gentle IVF hydration per nephrology recs  Hold offending agents  Appreciate nephrology recommendations     Paroxysmal A-fib: Currently in normal sinus rhythm.  Not on anticoagulation at baseline, not on beta-blocker.  Patient says he has skipped beats.   Hypokalemia: Supplemented  with potassium.   Morbid obesity: BMI 40.3      Objective Vital signs were reviewed and unremarkable. Vitals:   06/19/24 0915 06/19/24 1311 06/19/24 2128 06/20/24 0610  BP: (!) 145/68 (!) 153/67 (!) 156/73 (!) 154/73  Pulse: 93 96 94 96  Temp: 97.8 F (36.6 C) 97.9 F (36.6 C) 97.8 F (36.6 C) 98.2 F (36.8 C)  Resp: 16 15 18 18   Height:      Weight:      SpO2: 95% 98% 97% 94%  TempSrc: Oral Oral Oral Oral  BMI (Calculated):        Exam Physical Exam Constitutional:      Appearance: He is obese.  HENT:     Head: Atraumatic.     Nose: Nose normal.     Mouth/Throat:     Mouth: Mucous membranes are moist.  Eyes:     Pupils: Pupils are equal, round, and reactive to light.  Cardiovascular:     Rate and Rhythm: Normal rate and regular rhythm.  Pulmonary:     Effort: Pulmonary effort is normal.  Abdominal:     General: Bowel sounds are normal.  Musculoskeletal:        General: Normal range of motion.  Skin:    Comments: + LLE redness and swelling    Neurological:     General: No focal deficit present.  Psychiatric:        Mood and Affect: Mood normal.    Physical Exam Constitutional:  Appearance: He is obese.  HENT:     Head: Atraumatic.     Nose: Nose normal.     Mouth/Throat:     Mouth: Mucous membranes are moist.  Eyes:     Pupils: Pupils are equal, round, and reactive to light.  Cardiovascular:     Rate and Rhythm: Normal rate and regular rhythm.  Pulmonary:     Effort: Pulmonary effort is normal.  Abdominal:     General: Bowel sounds are normal.  Musculoskeletal:        General: Normal range of motion.  Skin:    Comments: + LLE redness and swelling    Neurological:     General: No focal deficit present.  Psychiatric:        Mood and Affect: Mood normal.       Labs / Other Information There are no new results to review at this time.  US  RENAL EXAM: US  Retroperitoneum Complete, Renal. 06/19/2024 06:47:12 PM  TECHNIQUE: Real-time ultrasonography of the retroperitoneum renal was performed.  COMPARISON: Prior study dated 01/15/2012.  CLINICAL HISTORY: AKI (acute kidney injury). Prior renal transplant.  FINDINGS:  LEFT KIDNEY/URETER: Native kidneys are not well visualized due to body habitus and overlying bowel gas.  RIGHT KIDNEY/URETER: Native kidneys are not well visualized due to body habitus and overlying bowel gas.  RIGHT LOWER QUADRANT TRANSPLANT KIDNEY: Right lower quadrant transplant kidney measures 10.8 x 6.6 x 7.8 cm. Normal parenchymal echotexture and thickness. No hydronephrosis or hydroureter. Normal parenchymal flow is demonstrated on color flow Doppler images. A complete renal transplant vascular Doppler study is not obtained. No calculus. No mass.  BLADDER: The visualized bladder is unremarkable.  IMPRESSION: 1. Right lower quadrant transplant kidney without hydronephrosis or hydroureter, with normal parenchymal echotexture and thickness and normal parenchymal flow on color Doppler images. 2. No acute findings. 3. Native kidneys are not well visualized.  Electronically signed by: Elsie Gravely MD 06/19/2024 10:30 PM EST RP Workstation: [id number] CT TIBIA FIBULA LEFT WO CONTRAST EXAM: CT LEFT LOWER EXTREMITY, WITHOUT IV CONTRAST 06/19/2024 10:21:00 PM  TECHNIQUE: Axial images were acquired through the left lower extremity without IV contrast. Reformatted images were reviewed. Automated exposure control, iterative reconstruction, and/or weight based adjustment of the mA/kV was utilized to reduce the radiation dose to as low as reasonably achievable.  COMPARISON: None available.  CLINICAL HISTORY: Soft tissue infection suspected, lower leg, xray done.  FINDINGS:  BONES AND JOINTS: Mild  degenerative changes in the knee and ankle joints. Bones appear intact. No acute fracture or focal osseous lesion. No dislocation. No focal bone sclerosis or bone erosion to suggest evidence of osteomyelitis. The joint spaces are normal.  SOFT TISSUES: Diffuse soft tissue edema most prominent around the lateral and posterior aspects of the lower leg and becoming circumferential into the lower leg and ankle region. Edema extends into the dorsum of the foot. No loculated collections are identified. No radiopaque foreign bodies or gas demonstrated in the soft tissues.  IMPRESSION: 1. Diffuse soft tissue edema extending from the lower leg into the ankle and dorsum of the foot, without loculated collection, soft tissue gas, or radiopaque foreign body. 2. No acute osseous abnormality or CT evidence of osteomyelitis.  Electronically signed by: Elsie Gravely MD 06/19/2024 10:27 PM EST RP Workstation: [id number] VAS US  LOWER EXTREMITY VENOUS (DVT)  Lower Venous DVT Study  Patient Name:  [name] [name]  Date of Exam:   06/19/2024 Medical Rec #: [id number]      Accession #:  [id number] Date of Birth: 1946/02/05      Patient Gender: M Patient Age:   37 years Exam Location:  [facility] Procedure:      VAS US  LOWER EXTREMITY VENOUS (DVT) Referring Phys: TIMOTHY OPYD  --------------------------------------------------------------------------------   Indications: Swelling, Pain, Erythema, and Cellulitis, venous stasis.   Limitations: Body habitus, poor ultrasound/tissue interface and Pain with compression maneuvers, subcutaneous edema. Comparison Study: No prior study on file  Performing Technologist: Alberta Lis RVS    Examination Guidelines: A complete evaluation includes B-mode imaging, spectral Doppler, color Doppler, and power Doppler as needed of all accessible portions of each vessel. Bilateral testing is considered an integral part of a complete examination.  Limited examinations for reoccurring indications may be performed as noted. The reflux portion of the exam is performed with the patient in reverse Trendelenburg.     +-----+---------------+---------+-----------+----------+--------------+ RIGHTCompressibilityPhasicitySpontaneityPropertiesThrombus Aging +-----+---------------+---------+-----------+----------+--------------+ CFV  Full           Yes      Yes                                 +-----+---------------+---------+-----------+----------+--------------+ SFJ  Full                                                        +-----+---------------+---------+-----------+----------+--------------+        +---------+---------------+---------+-----------+----------+-------------------+ LEFT     CompressibilityPhasicitySpontaneityPropertiesThrombus Aging      +---------+---------------+---------+-----------+----------+-------------------+ CFV      Full           Yes      Yes                                      +---------+---------------+---------+-----------+----------+-------------------+ SFJ      Full                                                             +---------+---------------+---------+-----------+----------+-------------------+ FV Prox  Full           Yes      Yes                                      +---------+---------------+---------+-----------+----------+-------------------+ FV Mid   Full                                                             +---------+---------------+---------+-----------+----------+-------------------+ FV DistalFull                                                             +---------+---------------+---------+-----------+----------+-------------------+  PFV      Full           Yes      No                                       +---------+---------------+---------+-----------+----------+-------------------+ POP      Full            Yes      Yes                                      +---------+---------------+---------+-----------+----------+-------------------+ PTV                                                   Not well visualized +---------+---------------+---------+-----------+----------+-------------------+ PERO                                                  Not well visualized +---------+---------------+---------+-----------+----------+-------------------+ Gastroc  Full                                                             +---------+---------------+---------+-----------+----------+-------------------+             Summary: RIGHT: - No evidence of common femoral vein obstruction.      LEFT:  - There is no evidence of deep vein thrombosis in the lower extremity. However, portions of this examination were limited- see technologist comments above.   - No cystic structure found in the popliteal fossa. - Ultrasound characteristics of enlarged lymph nodes noted in the groin.    *See table(s) above for measurements and observations.  Electronically signed by Debby Robertson on 06/19/2024 at 8:23:44 PM.      Final    Lab Results  Component Value Date   WBC 5.1 06/20/2024   HGB 13.1 06/20/2024   HCT 39.2 06/20/2024   MCV 89.1 06/20/2024   PLT 157 06/20/2024   .Last metabolic panel Lab Results  Component Value Date   GLUCOSE 145 (H) 06/20/2024   NA 142 06/20/2024   K 3.3 (L) 06/20/2024   CL 106 06/20/2024   CO2 21 (L) 06/20/2024   BUN 28 (H) 06/20/2024   CREATININE 1.73 (H) 06/20/2024   GFRNONAA 40 (L) 06/20/2024   CALCIUM 8.4 (L) 06/20/2024   PROT 6.2 (L) 06/19/2024   ALBUMIN  3.1 (L) 06/19/2024   BILITOT 0.7 06/19/2024   ALKPHOS 94 06/19/2024   AST 15 06/19/2024   ALT 14 06/19/2024   ANIONGAP 15 06/20/2024     Time spent: >60 [facility] 06/20/2024, 1:59 PM   "

## 2024-06-20 NOTE — Evaluation (Signed)
 Physical Therapy Evaluation Patient Details Name: [name] [name] MRN: [id number] DOB: [birth year] Today's Date: 06/20/2024  History of Present Illness  78 y.o. 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  Clinical Impression  Pt admitted with above diagnosis.  Pt agreeable to PT. Pt plans to enter and stay on basement level of his home, wife will assist as needed.  Education provided on importance of mobiliyt/activity, LE AROM and elevation intermittently throughout the day.pt is ready to d/c to H@H  program from PT standpoint. No DME needs. Will follow  in acute setting if pt does not d/c   Pt currently with functional limitations due to the deficits listed below (see PT Problem List). Pt will benefit from acute skilled PT to increase their independence and safety with mobility to allow discharge.           If plan is discharge home, recommend the following: Help with stairs or ramp for entrance;Assistance with cooking/housework;Assist for transportation   Can travel by private vehicle        Equipment Recommendations None recommended by PT  Recommendations for Other Services       Functional Status Assessment       Precautions / Restrictions Precautions Precautions: Fall Precaution/Restrictions Comments: LE edema Restrictions Weight Bearing Restrictions Per Provider Order: No      Mobility  Bed Mobility Overal bed mobility: Modified Independent             General bed mobility comments: HOB at 30 degrees    Transfers Overall transfer level: Needs assistance Equipment used: Rolling walker (2 wheels) Transfers: Sit to/from Stand Sit to Stand: Supervision            General transfer comment: cues for hand placement, supervision for safety    Ambulation/Gait Ambulation/Gait assistance: Supervision Gait Distance (Feet): 120 Feet Assistive device: None Gait Pattern/deviations: Decreased stance time - left, Decreased step length - right, Decreased step length - left, Wide base of support Gait velocity: decr     General Gait Details: supervision for safety, pt reports incr pain with WBing but tolerable. no LOB, supervision for safety  Stairs            Wheelchair Mobility     Tilt Bed    Modified Rankin (Stroke Patients Only)       Balance Overall balance assessment: Needs assistance Sitting-balance support: No upper extremity supported, Feet supported Sitting balance-Leahy Scale: Fair     Standing balance support: During functional activity, Reliant on assistive device for balance Standing balance-Leahy Scale: Fair Standing balance comment: Fair+                             Pertinent Vitals/Pain Pain Assessment Pain Assessment: Faces Faces Pain Scale: Hurts little more Pain Location: right LE Pain Descriptors / Indicators: Aching Pain Intervention(s): Limited activity within patient's tolerance, Monitored during session, Premedicated before session    Home Living Family/patient expects to be discharged to:: Private residence Living Arrangements: Spouse/significant other Available Help at Discharge: Family Type of Home: House Home Access: Level entry       Home Layout: Two level;Laundry or work area in [employer] Equipment: Agricultural Consultant (2 wheels);Adaptive equipment Additional  Comments: plans to stay on basement level and not go upstairs    Prior Function Prior Level of Function : Independent/Modified Independent;Driving;Needs assist       Physical Assist : Mobility (physical) Mobility (physical): Bed mobility   Mobility Comments: ind; spouse recently assisting RLE into bed ADLs  Comments: ind     Extremity/Trunk Assessment   Upper Extremity Assessment Upper Extremity Assessment: Overall WFL for tasks assessed    Lower Extremity Assessment Lower Extremity Assessment: RLE deficits/detail;LLE deficits/detail RLE Deficits / Details: edematous, erythematous; AROM grossly WFL. strength 4/5 RLE: Unable to fully assess due to pain LLE Deficits / Details: edematous (pitting edema distal lower leg and foot) erythematous; ankle ROM limited d/t edema. AROM knee and hip grossly WFL. strength 4/5 LLE: Unable to fully assess due to pain       Communication   Communication Communication: No apparent difficulties    Cognition Arousal: Alert Behavior During Therapy: WFL for tasks assessed/performed   PT - Cognitive impairments: No apparent impairments                         Following commands: Intact       Cueing Cueing Techniques: Verbal cues     General Comments      Exercises     Assessment/Plan    PT Assessment Patient needs continued PT services  PT Problem List Decreased mobility;Pain;Decreased activity tolerance       PT Treatment Interventions DME instruction;Therapeutic activities;Gait training;Functional mobility training;Therapeutic exercise;Patient/family education;Balance training    PT Goals (Current goals can be found in the Care Plan section)  Acute Rehab PT Goals Patient Stated Goal: home soon! PT Goal Formulation: With patient Time For Goal Achievement: 07/03/24 Potential to Achieve Goals: Good    Frequency Min 2X/week     Co-evaluation               AM-PAC PT 6 Clicks Mobility  Outcome Measure Help needed turning from your back to your side while in a flat bed without using bedrails?: A Little Help needed moving from lying on your back to sitting on the side of a flat bed without using bedrails?: A Little Help needed moving to and from a bed to a chair (including a wheelchair)?: A Little Help needed  standing up from a chair using your arms (e.g., wheelchair or bedside chair)?: A Little Help needed to walk in hospital room?: A Little   6 Click Score: 15    End of Session Equipment Utilized During Treatment: Gait belt Activity Tolerance: Patient tolerated treatment well Patient left: with call bell/phone within reach;in bed;with family/visitor present Nurse Communication: Mobility status PT Visit Diagnosis: Other abnormalities of gait and mobility (R26.89)    Time: 9049-8976 PT Time Calculation (min) (ACUTE ONLY): 33 min   Charges:   PT Evaluation $PT Eval Low Complexity: 1 Low PT Treatments $Gait Training: 8-22 mins PT General Charges $$ ACUTE PT VISIT: 1 Visit         Burhan Barham, PT  Acute Rehab Dept ([facility]) [phone]  06/20/2024   [facility] 06/20/2024, 11:50 AM

## 2024-06-20 NOTE — Consult Note (Signed)
 "   ORTHOPAEDIC CONSULTATION  REQUESTING PHYSICIAN: Jillian Buttery, MD  Chief Complaint: Cellulitis and lymphedema bilateral lower extremities.  HPI: [name] [name] is a 78 y.o. male who presents with chronic lymphedema bilateral lower extremities with recent episode of cellulitis.  Past Medical History:  Diagnosis Date   A-fib [facility])    post-operative afib 12/01/15   Arthritis    spine, hips (10/17/2016)   Benign neoplasm of colon    Chronic lower back pain    End stage liver disease (HCC)    End stage renal disease (HCC) 2014   Had transplant; stopped dialysis 2014   Gout    History of blood transfusion    w/my transplants   History of kidney stones    was in the new kidney has passed   IgA nephropathy    Kidney transplant recipient    Liver transplant recipient [facility])    NASH (nonalcoholic steatohepatitis)    Neuromuscular disorder (HCC)    Obesity    Past Surgical History:  Procedure Laterality Date   ANTERIOR LAT LUMBAR FUSION N/A 10/17/2016   Procedure: XLIF L3-4;  Surgeon: Donaciano Sprang, MD;  Location: [facility] OR;  Service: Orthopedics;  Laterality: N/A;  Requests for 3 hrs   AV FISTULA PLACEMENT Left ~ 2013   Av fistula removed Left ~ 2015   forearm   BACK SURGERY     CARPAL TUNNEL RELEASE Bilateral    CHOLECYSTECTOMY OPEN  2014   WITH LIVER TRANSPLANT   COLONOSCOPY     HERNIA REPAIR     LASIK     LIGATION OF ARTERIOVENOUS  FISTULA     pr ligath angioaccess av fistula   LIVER TRANSPLANT  2014   LUMBAR DISC SURGERY Left 12/01/2015   L3-L4 FAR LATERAL DISCECTOMY (WILTSE APPROACH   LUMBAR LAMINECTOMY/DECOMPRESSION MICRODISCECTOMY Left 12/01/2015   Procedure: LEFT L3-L4 FAR LATERAL DISCECTOMY (WILTSE APPROACH)     (1 LEVEL);  Surgeon: Donaciano Sprang, MD;  Location: [facility] OR;  Service: Orthopedics;  Laterality: Left;   NEPHRECTOMY RECIPIENT  2014   POSTERIOR LUMBAR FUSION  10/17/2016   Posterior fusion possible revision decompression L3-4/notes 10/17/2016   REFRACTIVE  SURGERY Bilateral    UMBILICAL HERNIA REPAIR     Social History   Socioeconomic History   Marital status: Married    Spouse name: Not on file   Number of children: Not on file   Years of education: Not on file   Highest education level: Not on file  Occupational History   Not on file  Tobacco Use   Smoking status: Never   Smokeless tobacco: Never  Substance and Sexual Activity   Alcohol use: No   Drug use: No   Sexual activity: Yes  Other Topics Concern   Not on file  Social History Narrative   Not on file   Social Drivers of Health   Tobacco Use: Low Risk (06/19/2024)   Patient History    Smoking Tobacco Use: Never    Smokeless Tobacco Use: Never    Passive Exposure: Not on file  Financial Resource Strain: Not on file  Food Insecurity: No Food Insecurity (06/19/2024)   Epic    Worried About Programme Researcher, Broadcasting/film/video in the Last Year: Never true    Ran Out of Food in the Last Year: Never true  Transportation Needs: No Transportation Needs (06/19/2024)   Epic    Lack of Transportation (Medical): No    Lack of Transportation (Non-Medical): No  Physical Activity:  Not on file  Stress: Not on file  Social Connections: Socially Integrated (06/19/2024)   Social Connection and Isolation Panel    Frequency of Communication with Friends and Family: Twice a week    Frequency of Social Gatherings with Friends and Family: Twice a week    Attends Religious Services: 1 to 4 times per year    Active Member of [employer] or Organizations: Yes    Attends Banker Meetings: 1 to 4 times per year    Marital Status: Married  Depression (EYV7-0): Not on file  Alcohol Screen: Not on file  Housing: Low Risk (06/19/2024)   Epic    Unable to Pay for Housing in the Last Year: No    Number of Times Moved in the Last Year: 0    Homeless in the Last Year: No  Utilities: Not At Risk (06/19/2024)   Epic    Threatened with loss of utilities: No  Health Literacy: Not on file   History  reviewed. No pertinent family history. - negative except otherwise stated in the family history section Allergies[1] Prior to Admission medications  Medication Sig Start Date End Date Taking? Authorizing Provider  allopurinol (ZYLOPRIM) 300 MG tablet Take 300 mg by mouth every evening.   Yes [provider]  furosemide (LASIX) 20 MG tablet Take 20 mg by mouth 2 (two) times daily.   Yes [provider]  JARDIANCE  10 MG TABS tablet Take 10 mg by mouth daily.   Yes [provider]  losartan  (COZAAR ) 50 MG tablet Take 50 mg by mouth daily. 08/02/23 08/01/24 Yes [provider]  mycophenolate  (MYFORTIC ) 180 MG EC tablet Take 360 mg by mouth 2 (two) times daily.   Yes [provider]  tacrolimus  (PROGRAF ) 0.5 MG capsule Take 0.5 mg by mouth every evening. 11/24/19  Yes [provider]  tacrolimus  (PROGRAF ) 1 MG capsule Take 1 mg by mouth daily with breakfast.   Yes [provider]  doxycycline  (VIBRAMYCIN ) 100 MG capsule Take 1 capsule (100 mg total) by mouth 2 (two) times daily. Patient not taking: Reported on 06/18/2024 05/29/24   Johnie Flaming A, NP  gabapentin  (NEURONTIN ) 300 MG capsule Take 1 capsule (300 mg total) by mouth 3 (three) times daily. 10/19/16 09/14/19  [name], Dedra Maus, PA-C   US  RENAL Result Date: 06/19/2024 EXAM: US  Retroperitoneum Complete, Renal. 06/19/2024 06:47:12 PM TECHNIQUE: Real-time ultrasonography of the retroperitoneum renal was performed. COMPARISON: Prior study dated 01/15/2012. CLINICAL HISTORY: AKI (acute kidney injury). Prior renal transplant. FINDINGS: LEFT KIDNEY/URETER: Native kidneys are not well visualized due to body habitus and overlying bowel gas. RIGHT KIDNEY/URETER: Native kidneys are not well visualized due to body habitus and overlying bowel gas. RIGHT LOWER QUADRANT TRANSPLANT KIDNEY: Right lower quadrant transplant kidney measures 10.8 x 6.6 x 7.8 cm. Normal parenchymal echotexture and  thickness. No hydronephrosis or hydroureter. Normal parenchymal flow is demonstrated on color flow Doppler images. A complete renal transplant vascular Doppler study is not obtained. No calculus. No mass. BLADDER: The visualized bladder is unremarkable. IMPRESSION: 1. Right lower quadrant transplant kidney without hydronephrosis or hydroureter, with normal parenchymal echotexture and thickness and normal parenchymal flow on color Doppler images. 2. No acute findings. 3. Native kidneys are not well visualized. Electronically signed by: Elsie Gravely MD 06/19/2024 10:30 PM EST RP Workstation: [id number]   CT TIBIA FIBULA LEFT WO CONTRAST Result Date: 06/19/2024 EXAM: CT LEFT LOWER EXTREMITY, WITHOUT IV CONTRAST 06/19/2024 10:21:00 PM TECHNIQUE: Axial images were acquired through  the left lower extremity without IV contrast. Reformatted images were reviewed. Automated exposure control, iterative reconstruction, and/or weight based adjustment of the mA/kV was utilized to reduce the radiation dose to as low as reasonably achievable. COMPARISON: None available. CLINICAL HISTORY: Soft tissue infection suspected, lower leg, xray done. FINDINGS: BONES AND JOINTS: Mild degenerative changes in the knee and ankle joints. Bones appear intact. No acute fracture or focal osseous lesion. No dislocation. No focal bone sclerosis or bone erosion to suggest evidence of osteomyelitis. The joint spaces are normal. SOFT TISSUES: Diffuse soft tissue edema most prominent around the lateral and posterior aspects of the lower leg and becoming circumferential into the lower leg and ankle region. Edema extends into the dorsum of the foot. No loculated collections are identified. No radiopaque foreign bodies or gas demonstrated in the soft tissues. IMPRESSION: 1. Diffuse soft tissue edema extending from the lower leg into the ankle and dorsum of the foot, without loculated collection, soft tissue gas, or radiopaque foreign body. 2. No  acute osseous abnormality or CT evidence of osteomyelitis. Electronically signed by: Elsie Gravely MD 06/19/2024 10:27 PM EST RP Workstation: [id number]   VAS US  LOWER EXTREMITY VENOUS (DVT) Result Date: 06/19/2024  Lower Venous DVT Study Patient Name:  Kemarion A Shawn  Date of Exam:   06/19/2024 Medical Rec #: [id number]      Accession #:    [id number] Date of Birth: [birth year]      Patient Gender: M Patient Age:   53 years Exam Location:  [facility] Procedure:      VAS US  LOWER EXTREMITY VENOUS (DVT) Referring Phys: TIMOTHY OPYD --------------------------------------------------------------------------------  Indications: Swelling, Pain, Erythema, and Cellulitis, venous stasis.  Limitations: Body habitus, poor ultrasound/tissue interface and Pain with compression maneuvers, subcutaneous edema. Comparison Study: No prior study on file Performing Technologist: Alberta Lis RVS  Examination Guidelines: A complete evaluation includes B-mode imaging, spectral Doppler, color Doppler, and power Doppler as needed of all accessible portions of each vessel. Bilateral testing is considered an integral part of a complete examination. Limited examinations for reoccurring indications may be performed as noted. The reflux portion of the exam is performed with the patient in reverse Trendelenburg.  +-----+---------------+---------+-----------+----------+--------------+ RIGHTCompressibilityPhasicitySpontaneityPropertiesThrombus Aging +-----+---------------+---------+-----------+----------+--------------+ CFV  Full           Yes      Yes                                 +-----+---------------+---------+-----------+----------+--------------+ SFJ  Full                                                        +-----+---------------+---------+-----------+----------+--------------+   +---------+---------------+---------+-----------+----------+-------------------+ LEFT      CompressibilityPhasicitySpontaneityPropertiesThrombus Aging      +---------+---------------+---------+-----------+----------+-------------------+ CFV      Full           Yes      Yes                                      +---------+---------------+---------+-----------+----------+-------------------+ SFJ      Full                                                             +---------+---------------+---------+-----------+----------+-------------------+  FV Prox  Full           Yes      Yes                                      +---------+---------------+---------+-----------+----------+-------------------+ FV Mid   Full                                                             +---------+---------------+---------+-----------+----------+-------------------+ FV DistalFull                                                             +---------+---------------+---------+-----------+----------+-------------------+ PFV      Full           Yes      No                                       +---------+---------------+---------+-----------+----------+-------------------+ POP      Full           Yes      Yes                                      +---------+---------------+---------+-----------+----------+-------------------+ PTV                                                   Not well visualized +---------+---------------+---------+-----------+----------+-------------------+ PERO                                                  Not well visualized +---------+---------------+---------+-----------+----------+-------------------+ Gastroc  Full                                                             +---------+---------------+---------+-----------+----------+-------------------+     Summary: RIGHT: - No evidence of common femoral vein obstruction.   LEFT: - There is no evidence of deep vein thrombosis in the lower extremity. However, portions of this  examination were limited- see technologist comments above.  - No cystic structure found in the popliteal fossa. - Ultrasound characteristics of enlarged lymph nodes noted in the groin.  *See table(s) above for measurements and observations. Electronically signed by Debby Robertson on 06/19/2024 at 8:23:44 PM.    Final    DG Chest 1 View Result Date: 06/18/2024 EXAM: 1 VIEW(S) XRAY OF THE CHEST 06/18/2024 11:39:00 PM COMPARISON: None available. CLINICAL HISTORY: fluid overload FINDINGS: LUNGS  AND PLEURA: Lung volumes are small. No focal pulmonary opacity. No pleural effusion. No pneumothorax. HEART AND MEDIASTINUM: Mild cardiomegaly. Mediastinal widening likely related to semi-erect position and poor pulmonary insufflation. BONES AND SOFT TISSUES: No acute osseous abnormality. IMPRESSION: 1. Mild cardiomegaly. 2. Pulmonary hypoinflation Electronically signed by: Dorethia Molt MD 06/18/2024 11:42 PM EST RP Workstation: [id number]   - pertinent xrays, CT, MRI studies were reviewed and independently interpreted  Positive ROS: All other systems have been reviewed and were otherwise negative with the exception of those mentioned in the HPI and as above.  Physical Exam: General: Alert, no acute distress Psychiatric: Patient is competent for consent with normal mood and affect Lymphatic: No axillary or cervical lymphadenopathy Cardiovascular: No pedal edema Respiratory: No cyanosis, no use of accessory musculature GI: No organomegaly, abdomen is soft and non-tender    Images:  @ENCIMAGES @  Labs:  Lab Results  Component Value Date   HGBA1C 5.8 (H) 12/01/2015   REPTSTATUS PENDING 06/18/2024   REPTSTATUS PENDING 06/18/2024   GRAMSTAIN NO WBC SEEN NO ORGANISMS SEEN 10/17/2011   CULT GRAM POSITIVE COCCI 06/18/2024   CULT GRAM POSITIVE COCCI 06/18/2024    Lab Results  Component Value Date   ALBUMIN  3.1 (L) 06/19/2024   ALBUMIN  3.5 06/18/2024   ALBUMIN  3.9 10/15/2016        Latest Ref  Rng & Units 06/20/2024    6:32 AM 06/19/2024    3:44 AM 06/18/2024    7:03 PM  CBC EXTENDED  WBC 4.0 - 10.5 K/uL 5.1  6.2  8.1   RBC 4.22 - 5.81 MIL/uL 4.40  4.45  4.94   Hemoglobin 13.0 - 17.0 g/dL 86.8  86.5  85.0   HCT 39.0 - 52.0 % 39.2  40.2  44.4   Platelets 150 - 400 K/uL 157  143  172   NEUT# 1.7 - 7.7 K/uL   6.3   Lymph# 0.7 - 4.0 K/uL   1.1     Neurologic: Patient does not have protective sensation bilateral lower extremities.   MUSCULOSKELETAL:   Skin: Examination patient has pitting edema both lower extremities left worse than right.  He has a palpable dorsalis pedis pulse.  There is cellulitis of the left leg worse than the right.  White cell count 5.1 with a hemoglobin of 13.1.  Albumin  3.1.  Hemoglobin A1c 5.8.  Review of the ultrasound shows no evidence of a DVT.  Assessment: Assessment: Chronic lymphedema both lower extremities left worse than right with cellulitis.  Plan: I will place an order for an Unna compression wraps for both lower extremities.  Anticipate patient will discharge to hospital at home for antibiotics.  I will follow-up in the office.  Discussed the importance of elevation exercise compression and protein supplement.  Will set patient up for lymphedema pumps at office follow-up.  Thank you for the consult and the opportunity to see Mr. Monta Police, MD [facility] [phone] 10:10 AM        [1]  Allergies Allergen Reactions   Cephalosporins Other (See Comments)    Liver/Kidney failure    Zyvox [Linezolid] Nausea Only and Other (See Comments)    Liver/Kidney failure.    "

## 2024-06-20 NOTE — Progress Notes (Signed)
 PHARMACY - PHYSICIAN COMMUNICATION CRITICAL VALUE ALERT - BLOOD CULTURE IDENTIFICATION (BCID)  [name] [name] is an 77 y.o. male who presented to [facility] on 06/18/2024 with cellulitis.  Assessment:  BCID + Staph epidermidis (methicillin resistant) in 2 out of 4 bottles (1 complete set)   Name of physician (or Provider) Contacted: Lavanda Horns, NP  Current antibiotics: Unasyn , Daptomycin   Changes to prescribed antibiotics recommended:  Patient is on recommended antibiotics - No changes needed  Results for orders placed or performed during the hospital encounter of 06/18/24  Blood Culture ID Panel (Reflexed) (Collected: 06/18/2024 10:51 PM)  Result Value Ref Range   Enterococcus faecalis NOT DETECTED NOT DETECTED   Enterococcus Faecium NOT DETECTED NOT DETECTED   Listeria monocytogenes NOT DETECTED NOT DETECTED   Staphylococcus species DETECTED (A) NOT DETECTED   Staphylococcus aureus (BCID) NOT DETECTED NOT DETECTED   Staphylococcus epidermidis DETECTED (A) NOT DETECTED   Staphylococcus lugdunensis NOT DETECTED NOT DETECTED   Streptococcus species NOT DETECTED NOT DETECTED   Streptococcus agalactiae NOT DETECTED NOT DETECTED   Streptococcus pneumoniae NOT DETECTED NOT DETECTED   Streptococcus pyogenes NOT DETECTED NOT DETECTED   A.calcoaceticus-baumannii NOT DETECTED NOT DETECTED   Bacteroides fragilis NOT DETECTED NOT DETECTED   Enterobacterales NOT DETECTED NOT DETECTED   Enterobacter cloacae complex NOT DETECTED NOT DETECTED   Escherichia coli NOT DETECTED NOT DETECTED   Klebsiella aerogenes NOT DETECTED NOT DETECTED   Klebsiella oxytoca NOT DETECTED NOT DETECTED   Klebsiella pneumoniae NOT DETECTED NOT DETECTED   Proteus species NOT DETECTED NOT DETECTED   Salmonella species NOT DETECTED NOT DETECTED   Serratia marcescens NOT DETECTED NOT DETECTED   Haemophilus influenzae NOT DETECTED NOT DETECTED   Neisseria meningitidis NOT DETECTED NOT DETECTED   Pseudomonas  aeruginosa NOT DETECTED NOT DETECTED   Stenotrophomonas maltophilia NOT DETECTED NOT DETECTED   Candida albicans NOT DETECTED NOT DETECTED   Candida auris NOT DETECTED NOT DETECTED   Candida glabrata NOT DETECTED NOT DETECTED   Candida krusei NOT DETECTED NOT DETECTED   Candida parapsilosis NOT DETECTED NOT DETECTED   Candida tropicalis NOT DETECTED NOT DETECTED   Cryptococcus neoformans/gattii NOT DETECTED NOT DETECTED   Methicillin resistance mecA/C DETECTED (A) NOT DETECTED    Arvin Gauss, PharmD 06/20/2024  12:43 AM

## 2024-06-20 NOTE — Progress Notes (Signed)
 Admission completed. Pt and wife voice understanding to call RN for needs, meds.

## 2024-06-20 NOTE — Progress Notes (Signed)
 Patient has a box of pills at bedside - he says they are his anti-rejection medications. He does not want them sent down to the pharmacy today because he is planning on discharging home & does not want anything to hold him up. I did explain the importance of capturing medication administration correctly in the hospital and the importance of pharmacy verification of the meds. He verbalized understanding - he will take his medications when he goes home later today.

## 2024-06-20 NOTE — Consult Note (Signed)
 WOC Nurse Consult Note: Reason for Consult: cellulitis L lower leg  Wound type: scattered areas of partial thickness skin loss L lower leg likely r/t venous insufficiency  Pressure Injury POA: NA  Measurement: extensive erythema and edema to entire lower leg currently being treated for cellulitis  Wound bed: scattered dry scabbed areas no open wounds  Drainage (amount, consistency, odor)none dry  Periwound: edema and erythema, B lower legs with chronic venous stasis changes evident  Dressing procedure/placement/frequency: No draining open wounds per bedside nurse. Will write prn order if any open wounds develop otherwise primary treatment is IV antibiotics and elevation of leg as much sa possible.   Cleanse  L lower leg with soap and water, dry and apply Xeroform gauze Soila [phone]) to any open wound bed, cover with ABD pad and secure with Kerlix roll gauze beginning right above toes and ending right below knee. Apply Ace bandage wrapped in same fashion as Kerlix for light compression.  Perform daily AS NEEDED. If no open wound ok to leave leg open to air and elevate as much as possible.    POC discussed with bedside nurse. Per bedside nurse no open wounds at this time.  WOC team will not follow. Reconsult if further needs arise.   Thank you,    Powell Bar MSN, RN-BC, [employer]

## 2024-06-20 NOTE — Progress Notes (Signed)
 First contact with patient virtual via camera. Pt getting admitted, spoke with patient regarding admission process. He and his wife very happy to be home.

## 2024-06-20 NOTE — Progress Notes (Signed)
 Discussed current meds that are  on hold to protect kidney. Pt adamant he is going to take the meds he always takes from his kidney transplant doctor,despite RN explanation. Pt spoke virtually with Dr. Eldonna as well. Pt says he will think about it.

## 2024-06-20 NOTE — Progress Notes (Signed)
 Pt refused new IV (present one infiltrated) and cannot receive IVF tonight. MD [facility] aware.

## 2024-06-20 NOTE — Plan of Care (Signed)

## 2024-06-20 NOTE — Progress Notes (Signed)
 Orthopedic Tech Progress Note Patient Details:  [name] [name] 1946-04-30 [id number]  Ortho Devices Type of Ortho Device: Radio broadcast assistant Ortho Device/Splint Location: bilateral Ortho Device/Splint Interventions: Ordered, Application, Adjustment   Post Interventions Patient Tolerated: Well Instructions Provided: Care of device, Adjustment of device  [name] 06/20/2024, 10:46 AM

## 2024-06-20 NOTE — Progress Notes (Signed)
 This EMT went to room 1330 to pick up pt for H@H  program. Upon arrival to the room the pt was found lying supine in the bed. Upon this EMT entering the room the pt began to question why he is being picked up this late. The pt was not happy and was very aggravated with all H@H  staff. The pt stated that the doctor told him that he would be out of the hospital in 1 hour and he feels like he's been lied to. He then stated that EMTP Lewis came into his room in the afternoon to ask the pre admit questions and that he though he would be leaving then. EMTP Lewis had explained the process to the pt and told him that it would be this PM until the admission would start. The pt was overall in a bad mood at this time. H@H  staff made aware. The pt then sat up in the bed and put his slippers on. The pt was handed his jacket and he donned it himself. The pt continued to speak negatively at this time about going home late and how his wife left this AM and took his phone so that he had no contact with anyone for hours. The pt stated that he has not been communicated with through this admission process with H@H  staff. He was not happy but ready to go home. The pt then ambulated without assistance to the H@H  WC. The pt was then taken outside and secured into the [place]. He was secured via safety straps and floor locking straps. He was then transported home. No safety issues to report during transport. During transport the pt was still speaking negatively about how late he was being taken home. The pt was also not satisfied with the route this EMT took and was not very polite during transport. Upon arrival to the pt's home he was removed from the [place] and wheeled to his front door. The pt then ambulated into his home with no assistance. He then sat in his recliner chair in the living area. H@H  equipment was then brought into the home. All equipment was set up and explained to both the pt and his wife. During equipment set up the pt had  the tv on and was not really paying attention to this EMT while explaining H@H  equipment even after being asked to turn off the tv. The pt's wife however did verbalize understandings on how to use H@H  equipment and was taking notes in a notebook about all the H@H  equipment. Vitals were then taken on the pt and documented into the flow chart. Virtual RN Jonne was then contacted to confirm all equipment was in working order. No issues were noted with equipment or H@H  wifi router. All equipment was placed into the living room. Photos of the home were sent to the H@H  Teams Chat at this time. There are 3 large gun safes in the home. All firearms are secured in the safes at this time. The pt was asked to stay out of the gun safes while on the H@H  program. Pt did verbalize understandings. At this time EMTP Lewis arrived on scene to finish the admission.

## 2024-06-20 NOTE — Progress Notes (Signed)
 Video call completed with patient. Administered remaining bedtime medications early per patient request because he is ready for bed. Patient made aware that RN is available via tablet or phone call if needed at anytime overnight. Patient appears stable and reports no pain at this time.

## 2024-06-20 NOTE — Progress Notes (Signed)
 [facility] Progress Note  Subjective:  Creat 1.7 today UOP not recorded (voiding a lot per pt)  Presentation summary: 78 y.o. year-old w/ PMH as below who presented to ED yesterday c/o L leg redness and swelling. In ED BP was 128/ 70, HR 90, RR 20, temop 98.2. 95% sat on RA, K+ 3.3, creat 1.8, normal CBC and lactate. Blood cx's were sent and pt started on IV vanc/ levaquin . In ED pt rec'd IV vanc, IV levaquin . Today he rec'd IV meropenem  and IV vancomycin . BP's were 140/80. We are asked to see for renal / transplant attention. Pt seen in room. Notes L leg swelling main problem. Notes liver+ kidney combined transplant in 2014, his renal MD is Dr Macie at [facility]. His last creat 1 mo ago was 1.6.  His myfortic  was recently upped to 360mg  bid, and the prograf  remains at 1 mg qam/ 0.5mg  qpm.   No SOB. CXR showed no infiltrates/ edema.   Vitals:   06/19/24 0915 06/19/24 1311 06/19/24 2128 06/20/24 0610  BP: (!) 145/68 (!) 153/67 (!) 156/73 (!) 154/73  Pulse: 93 96 94 96  Resp: 16 15 18 18   Temp: 97.8 F (36.6 C) 97.9 F (36.6 C) 97.8 F (36.6 C) 98.2 F (36.8 C)  TempSrc: Oral Oral Oral Oral  SpO2: 95% 98% 97% 94%  Weight:      Height:        Exam: Gen alert, no distress, large framed WM Sclera anicteric, throat clear  No jvd or bruits Chest clear bilat to bases RRR no MRG Abd soft ntnd no mass or ascites +bs Ext marked LLE pretibe edema 2-3+, RLE 1-2+ edema, left lower leg bright red, right lower leg is dark red  Neuro is alert, Ox 3 , nf     Home renal-relevant meds: Lasix Jardiance  Losartan  Myfortic  Prograf   Date   Creat  eGFR (ml/min) 2017   1.16  Transplant done in 2014 April 2018  1.29 Sept 2025  1.58  45 ml/min   Oct 2025  1.36- 1.53 47- 54 ml/min 12/18   1.88  37 12/19   1.85  37 06/20/24  1.78  39  Transplant renal US : RLQ kidney transplant 10.8 cm, no hydro/ stones UA 12/19 - 5 ketones, rare bact, 0-5 rbc/ wbc UNa 12/19 < 30, UCr 177     Assessment/ Plan: Renal failure/ transplant: b/l creat 1.3- 1.6, eGFR 45- 54 ml/min from sept -oct 2025. Pts nephrologist is Dr Macie at [facility]. He had a liver/ kidney transplant in 2014. Here the creat was 1.8 on admit in the setting of LLE cellulitis. There has been no hypotension, no IV contrast, acei/ARB, or other nephrotoxins. Was on lasix at home. Volume difficult to estimate given body habitus but suspected he was a bit dry and had not received much in the way of IVFs. Gave 750 NS bolus and started IVFs at 100 cc/hr last night. Urine lytes confirmed vol depletion (UNa < 30, high UCr). Continued his IS meds (prograf / myfortic ) as at home. Pharmacy kindly substituted IV daptomycin  for IV vancomycin . Pt is taking his transplant meds but using his own and not the hospital's, which is why it is recorded as not being taken. Creat today is 1.7. Suggest pmd taper off IVFs over the next 2-3 days. No other suggestions, will sign off.   LLE cellulitis: acute on chronic bilat LE edema/ venous insufficiency. Getting IV abx.  H/o combined kidney + liver transplant: in 2014 Obesity  Myer Fret MD  [facility] 06/20/2024, 7:18 AM  Recent Labs  Lab 06/18/24 1903 06/19/24 0344 06/19/24 1814  HGB 14.9 13.4  --   ALBUMIN  3.5  --  3.1*  CALCIUM 9.0 8.6* 8.6*  CREATININE 1.88* 1.85* 1.78*  K 3.3* 3.2* 3.2*   No results for input(s): IRON, TIBC, FERRITIN in the last 168 hours. Inpatient medications:  heparin  injection (subcutaneous)  5,000 Units Subcutaneous Q8H   mycophenolate   360 mg Oral BID   tacrolimus   0.5 mg Oral QPM   tacrolimus   1 mg Oral Q breakfast    sodium chloride  100 mL/hr at 06/20/24 0557   ampicillin -sulbactam (UNASYN ) IV 3 g (06/20/24 0558)   DAPTOmycin  500 mg (06/19/24 2033)   sodium chloride  Stopped (06/19/24 1819)   acetaminophen  **OR** acetaminophen , HYDROmorphone  (DILAUDID ) injection, oxyCODONE , prochlorperazine , senna

## 2024-06-20 NOTE — Progress Notes (Signed)
 H@H  medic out to patients home tonight for admission. On arrival, the patients wife was at the door to welcome me in. At this time, EMT Faith was in the home with the patient setting him up on the [facility] equipment. Vitals were taken at this time. IV was flushed, which caused a little pain at that time. Pt was started on maintenance fluids that were ordered. The patient did feel a little pain at the IV site with the infusion and stated that it was leaking. The dressing was changed at this time as I also attempted to tighten the IV hub. After this, the skin assessment was completed with RN Jon. Education was provided on the program and how the equipment works as well as adherence to the medication schedule and the administering of the medications. It was explained to him that we would be sequestering his home medications while he was in the program. It was also explained that some of his medications that he usually takes at home were put on hold due to his kidney function. At that time he was dismissive and reluctant to follow the medication administration instructions. He stated I will continue to take my at home medications because that's what my GI doctor informed me to do. I relayed this information to RN Jon via chat who briefly came back on video call with the patient to attempt to explain to the patient why we were doing it this way. At that time he seemed to understand, however, shortly after getting off the video call he continued to state that he would continue to do what he wanted. At this time I left to head back to the hospital but was stopped by the patients wife in the driveway who told me that the patients IV was again leaking. When I returned inside the patient was on a video call with the RN and Dr. Eldonna who were providing further education on the medication. I, at this time, attempted to change the IV but the patient would have rather have it removed since he stated it didn't work. When  the patient got off the video call with Dr. Eldonna and RN Jon he stated I won't take the allopurinol tonight because that's what I said I'll do but I'm going to take everything else. Pt also refused placement of new IV and subsequent 500cc fluid bolus. This was also related to Dr. Eldonna and RN Jon. No other tasks were needed for this admission.

## 2024-06-20 NOTE — Hospital Course (Addendum)
 [name] is a 78 year old male with history of CKD stage IIIb,, venous stasis, atrial fibrillation not on anticoagulation, history of IgA nephropathy, history of native right renal cell carcinoma status post cryoablation, end-stage renal disease status post renal transplant, currently CKD stage IIIb, morbid obesity.    06/18/2024: Patient presents to the ED for chief concerns of left leg swelling missed for 2 weeks.  In the ED, patient received 1 dose of levofloxacin  750 mg IV at 23:14 hrs.  Vancomycin  1 g, 1 dose at 2312.  12/18: Patient admitted to hospitalist service for left lower extremity cellulitis.  Blood cultures x 2 collected. Patient started on broad-spectrum antibiotics:   Early morning on 12/19: Meropenem  1 g one-time dose at 3:38 AM, vancomycin  1.5 g at 1:24 AM.  Unasyn  every 6 hours per pharmacy initiated at 1211.  Daptomycin  4 mg IV one-time dose at 2033.  12/20: Patient transferred to H@H  service. Blood cultures grew positive for Staph epidermidis in 2 sets.  Complete echo done which was read as estimate ejection fraction 60 to 65%, grade 1 diastolic dysfunction.  12/21: I assumed care of patient. Day 3 of abx. Ceftriaxone  2 g IV daily added per ID consult note. ID specialist, Dr. Dennise states they will keep pt on list and follow peripherally.  ID will reach out to therapy any changes.  IV, infiltrated. Medic will attempt IV and if needed, pt will come back to the ED.  12/22: day 4 of abx. Renal function improved on BMP yesterday. Home losartan  50 mg daily, Jardiance  10 mg daily were resumed. Potassium level noted to be 3.4 on labs 12/21, patient states he will eat two bananas.  12/23: day 4 of abx. BMP and serum magnesium labs today. Patient states he is doing well and that he is more used to our service now. Blood cx from 06/18/24, s/s has not resulted today. Potassium chloride  20 mEq PO ordered for evening. Though he may refused it.  Home Health for complex wound for  Unna boot changes (2x/week) placed. Next time wound changes is due is Thursday.   12/24: I assumed care this morning.  Pt overall doing well.  Did decline potassium, stating he will not take it.  We discussed home health for Unna boot changes.  Boots were changed today at patient request to have legs examined prior to discharge.   Patient is medically stable and requesting discharge today.

## 2024-06-20 NOTE — Progress Notes (Signed)
 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  Redness/swelling of the left lower extremity ongoing for 2 weeks, finished 10 days course of doxycycline  without any improvement.  Also reported fever at home.  On presentation, he was afebrile, hemodynamically stable.  Lab work showed potassium 3.3, creatinine of 1.8.  Patient admitted for further management of left lower extremity cellulitis, AKI.  Since kidney function did not improve significantly, we also  consulted nephrology.  Now  blood cultures showing gram-positive cocci in both sets/Staph epidermidis.  ID also consulted  Assessment & Plan:  Principal Problem:   Cellulitis of left lower extremity Active Problems:   PAF (paroxysmal atrial fibrillation) (HCC)   Renal transplant, status post   Immunosuppressed status   Liver transplant recipient [facility])   Acute renal failure superimposed on stage 3a chronic kidney disease (HCC)   Lymphedema   Left lower extremity cellulitis/gram-positive bacteremia: Presented with redness, swelling of left lower extremity, fever.  Has bilateral chronic venous stasis changes.  His legs are always red.  Redness worsened on the left lower extremity.  Venous Doppler did not show any DVT.  CT of the left lower extremity did not show any abscess or fluid collection.  Blood cultures showing gram-positive cocci/Staph epidermidis.  Likely contamination but could not rule out true infection.  ID also consulted.  Currently on daptomycin , Levaquin .  2D echo ordered.  Repeat blood  cultures ordered. Dr. Harden also saw him for his bilateral chronic lymphedema. Takes Lasix for chronic bilateral lower EXTR edema, currently on hold.  AKI on CKD stage IIIa/history of kidney and liver transplant: Baseline creatinine around 1.3.  Presented with creatinine of 1.8.  Takes losartan , Lasix, Jardiance  at home.  No hydronephrosis or any other problem as per renal ultrasound.  Takes immunosuppressants, tacrolimus /mycophenolate .  Nephrology following. He follows with nephrology at [facility].  Creatinine has trended down to 1.73 today  Paroxysmal A-fib: Currently in normal sinus rhythm.  Not on anticoagulation at baseline, not on beta-blocker.  Patient says he has skipped beats.  Hypokalemia: Supplemented  with potassium.  Morbid obesity: BMI 40.3        DVT prophylaxis:heparin  injection 5,000 Units Start: 06/20/24 0600Subcu heparin      Code Status: Full Code  Family Communication: Wife at bedside on 12/19  Patient status:Inpatient  Patient is from :Home  Anticipated discharge un:ynfz  Estimated DC date: After improvement in the kidney function, full workup for bacteremia   Consultants: Nephrology, ID, orthopedics  Procedures: None  Antimicrobials:  Anti-infectives (From admission, onward)    Start     Dose/Rate Route Frequency Ordered Stop   06/20/24 1045  levofloxacin  (LEVAQUIN ) tablet 250 mg        250 mg Oral  Once 06/20/24 0948     06/19/24 1930  DAPTOmycin  (CUBICIN ) IVPB 500 mg/58mL premix        4 mg/kg  135.1 kg 100 mL/hr over 30 Minutes Intravenous Daily 06/19/24 1835     06/19/24 1200  Ampicillin -Sulbactam (UNASYN ) 3 g in sodium chloride  0.9 % 100 mL IVPB  Status:  Discontinued        3 g  200 mL/hr over 30 Minutes Intravenous Every 6 hours 06/19/24 1031 06/20/24 0948   06/19/24 0300  meropenem  (MERREM ) 1 g in sodium chloride  0.9 % 100 mL IVPB  Status:  Discontinued        1 g 200 mL/hr over 30 Minutes Intravenous Every 12 hours 06/19/24 0118 06/19/24 1031    06/19/24 0200  vancomycin  (VANCOREADY) IVPB 1500 mg/300 mL        1,500 mg 150 mL/hr over 120 Minutes Intravenous  Once 06/19/24 0106 06/19/24 0839   06/18/24 2200  vancomycin  (VANCOCIN ) IVPB 1000 mg/200 mL premix        1,000 mg 200 mL/hr over 60 Minutes Intravenous  Once 06/18/24 2159 06/19/24 0041   06/18/24 2200  levofloxacin  (LEVAQUIN ) IVPB 750 mg        750 mg 100 mL/hr over 90 Minutes Intravenous  Once 06/18/24 2159 06/19/24 0839       Subjective: Seen and examined at bedside today.  He was sitting on the edge of his bed.  Afebrile.  Still has significant edema, erythema of the left lower extremity.  He says he really wants to go home.  We discussed about the importance of full workup for going home.  Objective: Vitals:   06/19/24 0915 06/19/24 1311 06/19/24 2128 06/20/24 0610  BP: (!) 145/68 (!) 153/67 (!) 156/73 (!) 154/73  Pulse: 93 96 94 96  Resp: 16 15 18 18   Temp: 97.8 F (36.6 C) 97.9 F (36.6 C) 97.8 F (36.6 C) 98.2 F (36.8 C)  TempSrc: Oral Oral Oral Oral  SpO2: 95% 98% 97% 94%  Weight:      Height:        Intake/Output Summary (Last 24 hours) at 06/20/2024 1053 Last data filed at 06/19/2024 1400 Gross per 24 hour  Intake 240 ml  Output --  Net 240 ml   Filed Weights   06/19/24 0009  Weight: 135.1 kg    Examination:  General exam: Overall comfortable, not in distress, morbidly obese HEENT: PERRL Respiratory system:  no wheezes or crackles  Cardiovascular system: S1 & S2 heard, RRR.  Gastrointestinal system: Abdomen is nondistended, soft and nontender. Central nervous system: Alert and oriented Extremities: Bilateral lower extremity venous stasis changes, edematous/erythematous left lower extremity Skin: No no ulcers,no icterus     Data Reviewed: I have personally reviewed following labs and imaging studies  CBC: Recent Labs  Lab 06/18/24 1903 06/19/24 0344 06/20/24 0632  WBC 8.1 6.2 5.1  NEUTROABS 6.3  --   --   HGB 14.9 13.4  13.1  HCT 44.4 40.2 39.2  MCV 89.9 90.3 89.1  PLT 172 143* 157   Basic Metabolic Panel: Recent Labs  Lab 06/18/24 1903 06/19/24 0344 06/19/24 1814 06/20/24 0632  NA 137 139 138 142  K 3.3* 3.2* 3.2* 3.3*  CL 100 101 103 106  CO2 23 24 21* 21*  GLUCOSE 187* 134* 149* 145*  BUN 39* 38* 34* 28*  CREATININE 1.88* 1.85* 1.78* 1.73*  CALCIUM 9.0 8.6* 8.6* 8.4*  MG  --  2.2  --   --      Recent Results (from the past 240 hours)  Blood culture (routine x 2)     Status: None (Preliminary result)   Collection Time: 06/18/24 10:51 PM   Specimen: BLOOD RIGHT HAND  Result Value Ref Range Status   Specimen Description   Final    BLOOD RIGHT HAND Performed at [facility], [address] [place],  [place] [address]    Special Requests   Final    BOTTLES DRAWN AEROBIC AND ANAEROBIC Blood Culture adequate volume Performed at [facility], [address] [place], [place] [address]    Culture  Setup Time   Final    GRAM POSITIVE COCCI IN BOTH AEROBIC AND ANAEROBIC BOTTLES CRITICAL RESULT CALLED TO, READ BACK BY AND VERIFIED WITH: L POINTDEXTER PHARM D 06/20/2024 @0034  BY DD    Culture   Final    GRAM POSITIVE COCCI CULTURE REINCUBATED FOR BETTER GROWTH Performed at [facility], [address], [place], [place] [address]    Report Status PENDING  Incomplete  Blood culture (routine x 2)     Status: None (Preliminary result)   Collection Time: 06/18/24 10:51 PM   Specimen: BLOOD LEFT ARM  Result Value Ref Range Status   Specimen Description   Final    BLOOD LEFT ARM Performed at [facility], [address], [place], [place] [address]    Special Requests   Final    BOTTLES DRAWN AEROBIC AND ANAEROBIC Blood Culture adequate volume Performed at [facility], [address] [place] [address] [place], [place] [address]    Culture  Setup Time   Final    GRAM POSITIVE COCCI IN BOTH AEROBIC AND ANAEROBIC  BOTTLES CRITICAL VALUE NOTED.  VALUE IS CONSISTENT WITH PREVIOUSLY REPORTED AND CALLED VALUE.    Culture   Final    GRAM POSITIVE COCCI CULTURE REINCUBATED FOR BETTER GROWTH Performed at [facility], [address], [place], [place] [address]    Report Status PENDING  Incomplete  Blood Culture ID Panel (Reflexed)     Status: Abnormal   Collection Time: 06/18/24 10:51 PM  Result Value Ref Range Status   Enterococcus faecalis NOT DETECTED NOT DETECTED Final   Enterococcus Faecium NOT DETECTED NOT DETECTED Final   Listeria monocytogenes NOT DETECTED NOT DETECTED Final   Staphylococcus species DETECTED (A) NOT DETECTED Final    Comment: CRITICAL RESULT CALLED TO, READ BACK BY AND VERIFIED WITH: L POINTDEXTER PHARM D 06/20/2024 @0034  BY DD    Staphylococcus aureus (BCID) NOT DETECTED NOT DETECTED Final   Staphylococcus epidermidis DETECTED (A) NOT DETECTED Final    Comment: Methicillin (oxacillin) resistant coagulase negative staphylococcus. Possible blood culture contaminant (unless isolated from more than one blood culture draw or clinical case suggests pathogenicity). No antibiotic treatment is indicated for blood  culture contaminants. CRITICAL RESULT CALLED TO, READ BACK BY AND VERIFIED WITH: L POINTDEXTER PHARM D 06/20/2024 @0034  BY DD    Staphylococcus lugdunensis NOT DETECTED NOT DETECTED Final   Streptococcus species NOT DETECTED NOT DETECTED Final   Streptococcus agalactiae NOT DETECTED NOT DETECTED Final   Streptococcus pneumoniae NOT DETECTED NOT DETECTED Final   Streptococcus pyogenes NOT DETECTED NOT DETECTED Final   A.calcoaceticus-baumannii NOT DETECTED NOT DETECTED Final   Bacteroides fragilis NOT DETECTED NOT DETECTED Final   Enterobacterales NOT DETECTED NOT DETECTED Final   Enterobacter cloacae complex NOT DETECTED NOT DETECTED Final   Escherichia coli NOT DETECTED NOT DETECTED Final   Klebsiella aerogenes NOT DETECTED NOT DETECTED Final   Klebsiella oxytoca  NOT DETECTED NOT DETECTED Final   Klebsiella pneumoniae NOT DETECTED NOT DETECTED Final   Proteus species NOT DETECTED NOT DETECTED Final   Salmonella species NOT DETECTED NOT DETECTED Final   Serratia marcescens NOT DETECTED NOT DETECTED Final   Haemophilus influenzae NOT DETECTED NOT DETECTED Final   Neisseria meningitidis NOT DETECTED  NOT DETECTED Final   Pseudomonas aeruginosa NOT DETECTED NOT DETECTED Final   Stenotrophomonas maltophilia NOT DETECTED NOT DETECTED Final   Candida albicans NOT DETECTED NOT DETECTED Final   Candida auris NOT DETECTED NOT DETECTED Final   Candida glabrata NOT DETECTED NOT DETECTED Final   Candida krusei NOT DETECTED NOT DETECTED Final   Candida parapsilosis NOT DETECTED NOT DETECTED Final   Candida tropicalis NOT DETECTED NOT DETECTED Final   Cryptococcus neoformans/gattii NOT DETECTED NOT DETECTED Final   Methicillin resistance mecA/C DETECTED (A) NOT DETECTED Final    Comment: CRITICAL RESULT CALLED TO, READ BACK BY AND VERIFIED WITH: L POINTDEXTER PHARM D 06/20/2024 @0034  BY DD Performed at [facility], [address], [place], [place] [address]      Radiology Studies: US  RENAL Result Date: 06/19/2024 EXAM: US  Retroperitoneum Complete, Renal. 06/19/2024 06:47:12 PM TECHNIQUE: Real-time ultrasonography of the retroperitoneum renal was performed. COMPARISON: Prior study dated 01/15/2012. CLINICAL HISTORY: AKI (acute kidney injury). Prior renal transplant. FINDINGS: LEFT KIDNEY/URETER: Native kidneys are not well visualized due to body habitus and overlying bowel gas. RIGHT KIDNEY/URETER: Native kidneys are not well visualized due to body habitus and overlying bowel gas. RIGHT LOWER QUADRANT TRANSPLANT KIDNEY: Right lower quadrant transplant kidney measures 10.8 x 6.6 x 7.8 cm. Normal parenchymal echotexture and thickness. No hydronephrosis or hydroureter. Normal parenchymal flow is demonstrated on color flow Doppler images. A complete renal  transplant vascular Doppler study is not obtained. No calculus. No mass. BLADDER: The visualized bladder is unremarkable. IMPRESSION: 1. Right lower quadrant transplant kidney without hydronephrosis or hydroureter, with normal parenchymal echotexture and thickness and normal parenchymal flow on color Doppler images. 2. No acute findings. 3. Native kidneys are not well visualized. Electronically signed by: Elsie Gravely MD 06/19/2024 10:30 PM EST RP Workstation: [id number]   CT TIBIA FIBULA LEFT WO CONTRAST Result Date: 06/19/2024 EXAM: CT LEFT LOWER EXTREMITY, WITHOUT IV CONTRAST 06/19/2024 10:21:00 PM TECHNIQUE: Axial images were acquired through the left lower extremity without IV contrast. Reformatted images were reviewed. Automated exposure control, iterative reconstruction, and/or weight based adjustment of the mA/kV was utilized to reduce the radiation dose to as low as reasonably achievable. COMPARISON: None available. CLINICAL HISTORY: Soft tissue infection suspected, lower leg, xray done. FINDINGS: BONES AND JOINTS: Mild degenerative changes in the knee and ankle joints. Bones appear intact. No acute fracture or focal osseous lesion. No dislocation. No focal bone sclerosis or bone erosion to suggest evidence of osteomyelitis. The joint spaces are normal. SOFT TISSUES: Diffuse soft tissue edema most prominent around the lateral and posterior aspects of the lower leg and becoming circumferential into the lower leg and ankle region. Edema extends into the dorsum of the foot. No loculated collections are identified. No radiopaque foreign bodies or gas demonstrated in the soft tissues. IMPRESSION: 1. Diffuse soft tissue edema extending from the lower leg into the ankle and dorsum of the foot, without loculated collection, soft tissue gas, or radiopaque foreign body. 2. No acute osseous abnormality or CT evidence of osteomyelitis. Electronically signed by: Elsie Gravely MD 06/19/2024 10:27 PM EST RP  Workstation: [id number]   VAS US  LOWER EXTREMITY VENOUS (DVT) Result Date: 06/19/2024  Lower Venous DVT Study Patient Name:  Adrian A Mangino  Date of Exam:   06/19/2024 Medical Rec #: [id number]      Accession #:    [id number] Date of Birth: [birth year]      Patient Gender: M Patient Age:   65 years Exam Location:  [facility] Procedure:      VAS US  LOWER EXTREMITY VENOUS (DVT) Referring Phys: TIMOTHY OPYD --------------------------------------------------------------------------------  Indications: Swelling, Pain, Erythema, and Cellulitis, venous stasis.  Limitations: Body habitus, poor ultrasound/tissue interface and Pain with compression maneuvers, subcutaneous edema. Comparison Study: No prior study on file Performing Technologist: Alberta Lis RVS  Examination Guidelines: A complete evaluation includes B-mode imaging, spectral Doppler, color Doppler, and power Doppler as needed of all accessible portions of each vessel. Bilateral testing is considered an integral part of a complete examination. Limited examinations for reoccurring indications may be performed as noted. The reflux portion of the exam is performed with the patient in reverse Trendelenburg.  +-----+---------------+---------+-----------+----------+--------------+ RIGHTCompressibilityPhasicitySpontaneityPropertiesThrombus Aging +-----+---------------+---------+-----------+----------+--------------+ CFV  Full           Yes      Yes                                 +-----+---------------+---------+-----------+----------+--------------+ SFJ  Full                                                        +-----+---------------+---------+-----------+----------+--------------+   +---------+---------------+---------+-----------+----------+-------------------+ LEFT     CompressibilityPhasicitySpontaneityPropertiesThrombus Aging      +---------+---------------+---------+-----------+----------+-------------------+ CFV       Full           Yes      Yes                                      +---------+---------------+---------+-----------+----------+-------------------+ SFJ      Full                                                             +---------+---------------+---------+-----------+----------+-------------------+ FV Prox  Full           Yes      Yes                                      +---------+---------------+---------+-----------+----------+-------------------+ FV Mid   Full                                                             +---------+---------------+---------+-----------+----------+-------------------+ FV DistalFull                                                             +---------+---------------+---------+-----------+----------+-------------------+ PFV      Full           Yes      No                                       +---------+---------------+---------+-----------+----------+-------------------+  POP      Full           Yes      Yes                                      +---------+---------------+---------+-----------+----------+-------------------+ PTV                                                   Not well visualized +---------+---------------+---------+-----------+----------+-------------------+ PERO                                                  Not well visualized +---------+---------------+---------+-----------+----------+-------------------+ Gastroc  Full                                                             +---------+---------------+---------+-----------+----------+-------------------+     Summary: RIGHT: - No evidence of common femoral vein obstruction.   LEFT: - There is no evidence of deep vein thrombosis in the lower extremity. However, portions of this examination were limited- see technologist comments above.  - No cystic structure found in the popliteal fossa. - Ultrasound characteristics of enlarged lymph nodes  noted in the groin.  *See table(s) above for measurements and observations. Electronically signed by Debby Robertson on 06/19/2024 at 8:23:44 PM.    Final    DG Chest 1 View Result Date: 06/18/2024 EXAM: 1 VIEW(S) XRAY OF THE CHEST 06/18/2024 11:39:00 PM COMPARISON: None available. CLINICAL HISTORY: fluid overload FINDINGS: LUNGS AND PLEURA: Lung volumes are small. No focal pulmonary opacity. No pleural effusion. No pneumothorax. HEART AND MEDIASTINUM: Mild cardiomegaly. Mediastinal widening likely related to semi-erect position and poor pulmonary insufflation. BONES AND SOFT TISSUES: No acute osseous abnormality. IMPRESSION: 1. Mild cardiomegaly. 2. Pulmonary hypoinflation Electronically signed by: Dorethia Molt MD 06/18/2024 11:42 PM EST RP Workstation: [id number]    Scheduled Meds:  heparin  injection (subcutaneous)  5,000 Units Subcutaneous Q8H   levofloxacin   250 mg Oral Once   mycophenolate   360 mg Oral BID   tacrolimus   0.5 mg Oral QPM   tacrolimus   1 mg Oral Q breakfast   Continuous Infusions:  sodium chloride  85 mL/hr at 06/20/24 0932   DAPTOmycin  500 mg (06/19/24 2033)     LOS: 2 days   Ivonne Mustache, MD [facility] P12/20/2025, 10:53 AM  "

## 2024-06-21 DIAGNOSIS — I10 Essential (primary) hypertension: Secondary | ICD-10-CM

## 2024-06-21 DIAGNOSIS — R7881 Bacteremia: Secondary | ICD-10-CM

## 2024-06-21 DIAGNOSIS — I129 Hypertensive chronic kidney disease with stage 1 through stage 4 chronic kidney disease, or unspecified chronic kidney disease: Secondary | ICD-10-CM

## 2024-06-21 DIAGNOSIS — N178 Other acute kidney failure: Secondary | ICD-10-CM

## 2024-06-21 LAB — CBC
HCT: 39.2 % (ref 39.0–52.0)
Hemoglobin: 13.1 g/dL (ref 13.0–17.0)
MCH: 30.1 pg (ref 26.0–34.0)
MCHC: 33.4 g/dL (ref 30.0–36.0)
MCV: 90.1 fL (ref 80.0–100.0)
Platelets: 157 K/uL (ref 150–400)
RBC: 4.35 MIL/uL (ref 4.22–5.81)
RDW: 13.2 % (ref 11.5–15.5)
WBC: 4.7 K/uL (ref 4.0–10.5)
nRBC: 0 % (ref 0.0–0.2)

## 2024-06-21 LAB — COMPREHENSIVE METABOLIC PANEL WITH GFR
ALT: 13 U/L (ref 0–44)
AST: 17 U/L (ref 15–41)
Albumin: 2.9 g/dL — ABNORMAL LOW (ref 3.5–5.0)
Alkaline Phosphatase: 89 U/L (ref 38–126)
Anion gap: 14 (ref 5–15)
BUN: 20 mg/dL (ref 8–23)
CO2: 21 mmol/L — ABNORMAL LOW (ref 22–32)
Calcium: 8.6 mg/dL — ABNORMAL LOW (ref 8.9–10.3)
Chloride: 104 mmol/L (ref 98–111)
Creatinine, Ser: 1.42 mg/dL — ABNORMAL HIGH (ref 0.61–1.24)
GFR, Estimated: 51 mL/min — ABNORMAL LOW
Glucose, Bld: 148 mg/dL — ABNORMAL HIGH (ref 70–99)
Potassium: 3.4 mmol/L — ABNORMAL LOW (ref 3.5–5.1)
Sodium: 138 mmol/L (ref 135–145)
Total Bilirubin: 0.4 mg/dL (ref 0.0–1.2)
Total Protein: 5.9 g/dL — ABNORMAL LOW (ref 6.5–8.1)

## 2024-06-21 LAB — TACROLIMUS LEVEL: Tacrolimus (FK506) - LabCorp: 8.2 ng/mL (ref 5.0–20.0)

## 2024-06-21 MED ORDER — SODIUM CHLORIDE 0.9 % IV SOLN
2.0000 g | INTRAVENOUS | Status: DC
  Administered 2024-06-21 – 2024-06-24 (×4): 2 g via INTRAVENOUS
  Filled 2024-06-21 (×5): qty 20

## 2024-06-21 MED ORDER — HYDRALAZINE HCL 25 MG PO TABS
25.0000 mg | ORAL_TABLET | Freq: Two times a day (BID) | ORAL | Status: AC
  Administered 2024-06-21: 25 mg via ORAL
  Filled 2024-06-21: qty 1

## 2024-06-21 MED ORDER — LOSARTAN POTASSIUM 50 MG PO TABS
50.0000 mg | ORAL_TABLET | Freq: Every day | ORAL | Status: DC
  Administered 2024-06-22: 50 mg via ORAL
  Filled 2024-06-21 (×2): qty 1

## 2024-06-21 MED ORDER — HYDRALAZINE HCL 10 MG PO TABS
10.0000 mg | ORAL_TABLET | Freq: Two times a day (BID) | ORAL | Status: DC
  Administered 2024-06-23: 10 mg via ORAL
  Filled 2024-06-21 (×7): qty 1

## 2024-06-21 MED ORDER — EMPAGLIFLOZIN 10 MG PO TABS
10.0000 mg | ORAL_TABLET | Freq: Every day | ORAL | Status: DC
  Administered 2024-06-22: 10 mg via ORAL
  Filled 2024-06-21 (×2): qty 1

## 2024-06-21 NOTE — Assessment & Plan Note (Addendum)
 12/21: Day 3 of antibiotic Continue with daptomycin  as recommended by ID specialist Unna boot 2 times per week Next Boot change is 12/22, discussed with medic that I will need to see patient skin and images in the chart before Unna boot placement Check CBC in the AM 12/23: day 5 of abx

## 2024-06-21 NOTE — Assessment & Plan Note (Signed)
 Continue mycophenolate  and tacrolimus  per home dosing Continue outpatient follow-up with transplant specialist as appropriate

## 2024-06-21 NOTE — Progress Notes (Signed)
 H@H  medic out this morning to see patient for his first full day visit in the [facility] program. On arrival I was met at the door by the patients wife who welcomed me in. On assessment, lung sounds clear and equal bilaterally, pulses strong and regular, heart sounds S1, S2, bowel sounds active, and abdomen soft non-tender x4 quadrants. On standing up from the bed the patient had an instance of epistaxis that was controlled soon after. The patient was given a cold pack to place behind his neck to help control the bleed. The patient had no other instances of epistaxis afterwards. The patient took his own medications at this time from his pill container. The patient refused to take the same medication being brought out to him by us . The patient stated that it was his Prograf  and his Myfortic . There was no way to confirm the medications that he was taking. RN Shanda was made aware of this at this time and well as Dr. Sherre and Dr. Eldonna. The patient also refused his heparin  injection. The patient stated that he would be up and moving around. Pt denies pain and has no other complaints. A video visit with the RN and the provider was conducted and completed. A new IV was placed and he was given his IV Rocephin . Labs were drawn as ordered. No other tasks were needed at this time. The patient was educated on the adherence to the programs medication schedule. The patient continues to take at home medications not verified by the program. No other tasks were needed at this time. The patient was left in care of his wife. H@H  visit complete.

## 2024-06-21 NOTE — Plan of Care (Signed)
" °  Problem: Health Behavior/Discharge Planning: Goal: Ability to manage health-related needs will improve Outcome: Progressing   Problem: Clinical Measurements: Goal: Ability to maintain clinical measurements within normal limits will improve Outcome: Progressing Goal: Diagnostic test results will improve Outcome: Progressing   Problem: Activity: Goal: Risk for activity intolerance will decrease Outcome: Progressing   Problem: Nutrition: Goal: Adequate nutrition will be maintained Outcome: Progressing   Problem: Pain Managment: Goal: General experience of comfort will improve and/or be controlled Outcome: Progressing   Problem: Safety: Goal: Ability to remain free from injury will improve Outcome: Progressing   Problem: Skin Integrity: Goal: Skin integrity will improve Outcome: Progressing   "

## 2024-06-21 NOTE — Progress Notes (Signed)
 H@H  medic out this evening for patients second visit. On arrival, the patient welcomed me in. See flowsheet for assessment. The patient was seen sitting in his recliner across from his son who was there visiting for the night. Vitals were taken and uploaded. The patient was given his IV Daptomycin  at this time. Pt denies pain and has no other complaints tonight. Pt wants to continue taking at home medications. No further education was given at this time. No other tasks were needed once abx infusion was complete. The patient was left in care of himself and his son. H@H  visit complete.

## 2024-06-21 NOTE — Progress Notes (Signed)
" °   06/21/24 1020  Provider Notification  Provider Name/Title Amy Cox, DO  Date Provider Notified 06/21/24  Time Provider Notified 1031  Method of Notification Virtual Face-to-face  Notification Reason Red med refusal (Pt refused sub-q heparin )  Provider response Other (Comment) (subq heparin  discontinued)  Date of Provider Response 06/21/24  Time of Provider Response 1031   Patient refused subcutaneous heparin . Risks and benefits of DVT prophylaxis has been explained to pt and spouse at bedside by Dr. Sherre. Pt agreeable to ambulate for a few minutes every hour while awake.  "

## 2024-06-21 NOTE — Assessment & Plan Note (Signed)
 Patient currently not on anticholesterol medication

## 2024-06-21 NOTE — Assessment & Plan Note (Signed)
 With staph epidermidis from 2 sets Repeat blood cultures have been obtained on 12/20, at this time no growth to date Complete echo on 06/20/2024: Read as estimate ejection fraction 60 to 65%, grade 1 diastolic dysfunction Continue with ceftriaxone  as ordered by ID specialist Discussed case with ID specialist, Dr. Dennise who states they will keep patient on their list and follow peripherally.  ID will reach out should repeat blood cultures change.  12/23: blood cx s/s has not resulted as of 16:25.

## 2024-06-21 NOTE — Progress Notes (Addendum)
 " PROGRESS NOTE - Video Telemedicine  [name] [name]  FMW:[id number] DOB: 06/18/1946 DOA: 06/18/2024 PCP: Macie Benedetta POUR, MD   [name] is a 78 year old male with history of CKD stage IIIb,, venous stasis, atrial fibrillation not on anticoagulation, history of IgA nephropathy, history of native right renal cell carcinoma status post cryoablation, end-stage renal disease status post renal transplant, currently CKD stage IIIb, morbid obesity.    06/18/2024: Patient presents to the ED for chief concerns of left leg swelling missed for 2 weeks.  In the ED, patient received 1 dose of levofloxacin  750 mg IV at 23:14 hrs.  Vancomycin  1 g, 1 dose at 2312.  12/18: Patient admitted to hospitalist service for left lower extremity cellulitis.  Blood cultures x 2 collected. Patient started on broad-spectrum antibiotics:   Early morning on 12/19: Meropenem  1 g one-time dose at 3:38 AM, vancomycin  1.5 g at 1:24 AM.  Unasyn  every 6 hours per pharmacy initiated at 1211.  Daptomycin  4 mg IV one-time dose at 2033.  12/20: Patient transferred to H@H  service. Blood cultures grew positive for Staph epidermidis in 2 sets.  Complete echo done which was read as estimate ejection fraction 60 to 65%, grade 1 diastolic dysfunction.  12/21: I assumed care of patient. Day 3 of abx. Ceftriaxone  2 g IV daily added per ID consult note. ID specialist, Dr. Dennise states they will keep pt on list and follow peripherally.  ID will reach out to therapy any changes.  IV, infiltrated. Medic will attempt IV and if needed, pt will come back to the ED.  Assessment & Plan:   Principal Problem:   Cellulitis of left lower extremity Active Problems:   Renal transplant, status post   Acute renal failure superimposed on stage 3a chronic kidney disease (HCC)   Positive blood cultures   Immunosuppressed status   Liver transplant recipient (HCC)   PAF (paroxysmal atrial fibrillation) (HCC)   Morbid obesity (HCC)    Hyperlipidemia   Lymphedema   Essential hypertension   Assessment and Plan:  * Cellulitis of left lower extremity 12/21: Day 3 of antibiotic Continue with daptomycin  as recommended by ID specialist Unna boot 2 times per week Next Boot change is 12/22, discussed with medic that I will need to see patient skin and images in the chart before Unna boot placement Check CBC in the AM  Positive blood cultures With staph epidermidis from 2 sets Repeat blood cultures have been obtained on 12/20, at this time no growth to date Complete echo on 06/20/2024: Read as estimate ejection fraction 60 to 65%, grade 1 diastolic dysfunction Continue with ceftriaxone  as ordered by ID specialist Discussed case with ID specialist, Dr. Dennise who states they will keep patient on their list and follow peripherally.  ID will reach out should repeat blood cultures change.  Acute renal failure superimposed on stage 3a chronic kidney disease (HCC) Acute kidney injury on CKD stage IIIa Home losartan  and furosemide will be held Continue sodium chloride  infusion at 85 mL/h, 36 hours ordered Recheck CMP in a.m.  Liver transplant recipient [facility]) Continue mycophenolate  and tacrolimus  per home dosing Continue outpatient follow-up with transplant specialist as appropriate  Immunosuppressed status Status post renal and liver transplant in 2014 Currently on mycophenolate  360 mg p.o. twice daily, tacrolimus  0.5 mg every evening and tacrolimus  1 mg daily with breakfast  Essential hypertension Home furosemide 20 mg daily and losartan  50 mg daily will not be resumed on admission in setting of acute kidney  injury Hydralazine  25 mg one-time dose in the evening of 12/21 Hydralazine  10 mg p.o. twice daily starting on 12/22  Hyperlipidemia Patient currently not on anticholesterol medication  Morbid obesity (HCC) This complicates overall care and prognosis.   PAF (paroxysmal atrial fibrillation) (HCC) Not on  anticoagulation, not on rate control or rhythm control medication  DVT prophylaxis: ambulation as tolerated.  Heparin  and enoxaparin  subcutaneous was offered and ordered.  Risk and benefits of DVT prophylaxis has been explained to the patient and spouse via telemedicine encounter. Patient refuses.   Code Status: Full code Family Communication: Updated spouse at bedside Disposition Plan: Pending clinical course, pending blood cultures and ID specialist recommendation Level of care: Hospital at Home Med-Surg  Consultants:  Infectious disease  Procedures:  None  Antimicrobials: 12/21: Day 3 of antibiotic  Subjective:  At bedside, via video telemedicine encounter, patient was able to confirm his first last name, age, date of birth.  Patient does not appear to be in acute distress.  He reports he slept so much better last evening.  He reports the beds in the hospital so terrible, they should be sent to the presents.  He reports he understands that we will need an IV.  If we do not get an IV, we will need to send him back to the ED for IV establishment because his antibiotics are IV.  Objective: Vitals:   06/19/24 2128 06/20/24 0610 06/20/24 1711 06/21/24 0955  BP: (!) 156/73 (!) 154/73 (!) 173/78 (!) 151/76  Pulse: 94 96 92 97  Resp: 18 18 18 16   Temp: 97.8 F (36.6 C) 98.2 F (36.8 C) (!) 96.1 F (35.6 C) 97.7 F (36.5 C)  TempSrc: Oral Oral Oral Oral  SpO2: 97% 94% 96% 97%  Weight:      Height:        Intake/Output Summary (Last 24 hours) at 06/21/2024 1152 Last data filed at 06/20/2024 1458 Gross per 24 hour  Intake 49 ml  Output --  Net 49 ml   Filed Weights   06/19/24 0009  Weight: 135.1 kg   Examination was completed with the assistance of: Consuelo Kerns, medici, who was present in the house during the virtual encounter:  General exam: Appears calm and comfortable  Respiratory system: Clear to auscultation. Respiratory effort normal. Cardiovascular system: S1  & S2 heard, RRR. No murmurs. + pedal edema. Gastrointestinal system: Obese abdomen, abdomen is nondistended, soft and nontender. Normal bowel sounds heard. Central nervous system: Alert and oriented. No focal neurological deficits. Extremities: Symmetric 5 x 5 power. Unna boot in place Skin: No rashes, lesions or ulcers Psychiatry: Judgement and insight appear normal. Mood & affect appropriate.   Data Reviewed: I have personally reviewed following labs and imaging studies  CBC: Recent Labs  Lab 06/18/24 1903 06/19/24 0344 06/20/24 0632  WBC 8.1 6.2 5.1  NEUTROABS 6.3  --   --   HGB 14.9 13.4 13.1  HCT 44.4 40.2 39.2  MCV 89.9 90.3 89.1  PLT 172 143* 157   Basic Metabolic Panel: Recent Labs  Lab 06/18/24 1903 06/19/24 0344 06/19/24 1814 06/20/24 0632  NA 137 139 138 142  K 3.3* 3.2* 3.2* 3.3*  CL 100 101 103 106  CO2 23 24 21* 21*  GLUCOSE 187* 134* 149* 145*  BUN 39* 38* 34* 28*  CREATININE 1.88* 1.85* 1.78* 1.73*  CALCIUM 9.0 8.6* 8.6* 8.4*  MG  --  2.2  --  2.1   GFR: Estimated Creatinine Clearance: 51.7 mL/min (  A) (by C-G formula based on SCr of 1.73 mg/dL (H)).  Liver Function Tests: Recent Labs  Lab 06/18/24 1903 06/19/24 1814  AST 18 15  ALT 19 14  ALKPHOS 117 94  BILITOT 1.0 0.7  PROT 7.1 6.2*  ALBUMIN  3.5 3.1*   Cardiac Enzymes: Recent Labs  Lab 06/20/24 0632  CKTOTAL 65   Sepsis Labs: Recent Labs  Lab 06/18/24 2305  LATICACIDVEN 1.3   Recent Results (from the past 240 hours)  Blood culture (routine x 2)     Status: Abnormal (Preliminary result)   Collection Time: 06/18/24 10:51 PM   Specimen: BLOOD RIGHT HAND  Result Value Ref Range Status   Specimen Description   Final    BLOOD RIGHT HAND Performed at [facility], [address] [place], [place] [address]    Special Requests   Final    BOTTLES DRAWN AEROBIC AND ANAEROBIC Blood Culture adequate volume Performed at [facility], [address], [place], [place] [address]    Culture  Setup Time   Final    GRAM POSITIVE COCCI IN BOTH AEROBIC AND ANAEROBIC BOTTLES CRITICAL RESULT CALLED TO, READ BACK BY AND VERIFIED WITH: L POINTDEXTER PHARM D 06/20/2024 @0034  BY DD    Culture (A)  Final    STAPHYLOCOCCUS EPIDERMIDIS SUSCEPTIBILITIES TO FOLLOW Performed at [facility], [address], [place], [place] [address]    Report Status PENDING  Incomplete  Blood culture (routine x 2)     Status: Abnormal (Preliminary result)   Collection Time: 06/18/24 10:51 PM   Specimen: BLOOD LEFT ARM  Result Value Ref Range Status   Specimen Description   Final    BLOOD LEFT ARM Performed at [facility], [address], [place], [place] [address]    Special Requests   Final    BOTTLES DRAWN AEROBIC AND ANAEROBIC Blood Culture adequate volume Performed at [facility], [address], [place], [place] [address]    Culture  Setup Time   Final    GRAM POSITIVE COCCI IN BOTH AEROBIC AND ANAEROBIC BOTTLES CRITICAL VALUE NOTED.  VALUE IS CONSISTENT WITH PREVIOUSLY REPORTED AND CALLED VALUE. Performed at [facility], [address] [place], [place] [address]    Culture STAPHYLOCOCCUS EPIDERMIDIS (A)  Final   Report Status PENDING  Incomplete  Blood Culture ID Panel (Reflexed)     Status: Abnormal   Collection Time: 06/18/24 10:51 PM  Result Value Ref Range Status   Enterococcus faecalis NOT DETECTED NOT DETECTED Final   Enterococcus Faecium NOT DETECTED NOT DETECTED Final   Listeria monocytogenes NOT DETECTED NOT DETECTED Final   Staphylococcus species DETECTED (A) NOT DETECTED Final    Comment: CRITICAL RESULT CALLED TO, READ BACK BY AND VERIFIED WITH: L POINTDEXTER PHARM D 06/20/2024 @0034  BY DD    Staphylococcus aureus (BCID) NOT DETECTED NOT DETECTED Final   Staphylococcus epidermidis DETECTED (A) NOT DETECTED Final    Comment: Methicillin (oxacillin) resistant coagulase  negative staphylococcus. Possible blood culture contaminant (unless isolated from more than one blood culture draw or clinical case suggests pathogenicity). No antibiotic treatment is indicated for blood  culture contaminants. CRITICAL RESULT CALLED TO, READ BACK BY AND VERIFIED WITH: L POINTDEXTER PHARM D 06/20/2024 @0034  BY DD    Staphylococcus lugdunensis NOT DETECTED NOT DETECTED Final   Streptococcus species NOT DETECTED NOT DETECTED Final   Streptococcus agalactiae NOT DETECTED NOT DETECTED Final   Streptococcus pneumoniae NOT  DETECTED NOT DETECTED Final   Streptococcus pyogenes NOT DETECTED NOT DETECTED Final   A.calcoaceticus-baumannii NOT DETECTED NOT DETECTED Final   Bacteroides fragilis NOT DETECTED NOT DETECTED Final   Enterobacterales NOT DETECTED NOT DETECTED Final   Enterobacter cloacae complex NOT DETECTED NOT DETECTED Final   Escherichia coli NOT DETECTED NOT DETECTED Final   Klebsiella aerogenes NOT DETECTED NOT DETECTED Final   Klebsiella oxytoca NOT DETECTED NOT DETECTED Final   Klebsiella pneumoniae NOT DETECTED NOT DETECTED Final   Proteus species NOT DETECTED NOT DETECTED Final   Salmonella species NOT DETECTED NOT DETECTED Final   Serratia marcescens NOT DETECTED NOT DETECTED Final   Haemophilus influenzae NOT DETECTED NOT DETECTED Final   Neisseria meningitidis NOT DETECTED NOT DETECTED Final   Pseudomonas aeruginosa NOT DETECTED NOT DETECTED Final   Stenotrophomonas maltophilia NOT DETECTED NOT DETECTED Final   Candida albicans NOT DETECTED NOT DETECTED Final   Candida auris NOT DETECTED NOT DETECTED Final   Candida glabrata NOT DETECTED NOT DETECTED Final   Candida krusei NOT DETECTED NOT DETECTED Final   Candida parapsilosis NOT DETECTED NOT DETECTED Final   Candida tropicalis NOT DETECTED NOT DETECTED Final   Cryptococcus neoformans/gattii NOT DETECTED NOT DETECTED Final   Methicillin resistance mecA/C DETECTED (A) NOT DETECTED Final    Comment: CRITICAL  RESULT CALLED TO, READ BACK BY AND VERIFIED WITH: L POINTDEXTER PHARM D 06/20/2024 @0034  BY DD Performed at [facility], [address], [place], [place] [address]   Culture, blood (Routine X 2) w Reflex to ID Panel     Status: None (Preliminary result)   Collection Time: 06/20/24 11:07 AM   Specimen: BLOOD LEFT ARM  Result Value Ref Range Status   Specimen Description   Final    BLOOD LEFT ARM BOTTLES DRAWN AEROBIC ONLY Performed at [facility], [address] [place], [place] [address]    Special Requests   Final    Blood Culture adequate volume Performed at [facility], [address] [place], [place] [address]    Culture   Final    NO GROWTH < 24 HOURS Performed at [facility], [address], [place], [place] [address]    Report Status PENDING  Incomplete  Culture, blood (Routine X 2) w Reflex to ID Panel     Status: None (Preliminary result)   Collection Time: 06/20/24 11:13 AM   Specimen: BLOOD LEFT ARM  Result Value Ref Range Status   Specimen Description   Final    BLOOD LEFT ARM BOTTLES DRAWN AEROBIC ONLY Performed at [facility], [address] [place], [place] [address]    Special Requests   Final    Blood Culture adequate volume Performed at [facility], [address] [place], [place] [address]    Culture   Final    NO GROWTH < 24 HOURS Performed at [facility], [address] [place], [place] [address]    Report Status PENDING  Incomplete    Radiology Studies: ECHOCARDIOGRAM COMPLETE Result Date: 06/20/2024    ECHOCARDIOGRAM REPORT   Patient Name:   Jameek A Freeze Date of Exam: 06/20/2024 Medical Rec #:  [id number]     Height:       72.0 in Accession #:    [id number]    Weight:       297.8 lb Date of Birth:  05/02/1946     BSA:  2.523 m Patient Age:    78 years      BP:           154/73 mmHg Patient Gender: M             HR:           89 bpm. Exam  Location:  Inpatient Procedure: 2D Echo, Cardiac Doppler, Color Doppler and Intracardiac            Opacification Agent (Both Spectral and Color Flow Doppler were            utilized during procedure). Indications:    Bacteremia R78.81  History:        Patient has no prior history of Echocardiogram examinations.                 Signs/Symptoms:Hypertensive Heart Disease.  Sonographer:    Nathanel Devonshire Referring Phys: [phone] STEVEN J NEWTON IMPRESSIONS  1. Left ventricular ejection fraction, by estimation, is 60 to 65%. Left ventricular ejection fraction by 2D MOD biplane is 61.1 %. The left ventricle has normal function. The left ventricle has no regional wall motion abnormalities. There is mild left ventricular hypertrophy. Left ventricular diastolic parameters are consistent with Grade I diastolic dysfunction (impaired relaxation).  2. Right ventricular systolic function is normal. The right ventricular size is normal.  3. The mitral valve is normal in structure. No evidence of mitral valve regurgitation. No evidence of mitral stenosis.  4. The aortic valve was not well visualized. Aortic valve regurgitation is mild. No aortic stenosis is present. FINDINGS  Left Ventricle: Left ventricular ejection fraction, by estimation, is 60 to 65%. Left ventricular ejection fraction by 2D MOD biplane is 61.1 %. The left ventricle has normal function. The left ventricle has no regional wall motion abnormalities. The left ventricular internal cavity size was normal in size. There is mild left ventricular hypertrophy. Left ventricular diastolic parameters are consistent with Grade I diastolic dysfunction (impaired relaxation). Right Ventricle: The right ventricular size is normal. No increase in right ventricular wall thickness. Right ventricular systolic function is normal. Left Atrium: Left atrial size was normal in size. Right Atrium: Right atrial size was normal in size. Pericardium: There is no evidence of pericardial effusion.  Mitral Valve: The mitral valve is normal in structure. No evidence of mitral valve regurgitation. No evidence of mitral valve stenosis. Tricuspid Valve: The tricuspid valve is normal in structure. Tricuspid valve regurgitation is not demonstrated. No evidence of tricuspid stenosis. Aortic Valve: The aortic valve was not well visualized. Aortic valve regurgitation is mild. Aortic regurgitation PHT measures 445 msec. No aortic stenosis is present. Aortic valve mean gradient measures 7.0 mmHg. Aortic valve peak gradient measures 12.7 mmHg. Aortic valve area, by VTI measures 3.08 cm. Pulmonic Valve: The pulmonic valve was not well visualized. Pulmonic valve regurgitation is not visualized. No evidence of pulmonic stenosis. Aorta: The aortic root is normal in size and structure. Venous: The inferior vena cava was not well visualized. IAS/Shunts: The interatrial septum was not well visualized.  LEFT VENTRICLE PLAX 2D                        Biplane EF (MOD) LVIDd:         5.60 cm         LV Biplane EF:   Left LVIDs:         4.00 cm  ventricular LV PW:         1.30 cm                          ejection LV IVS:        1.30 cm                          fraction by LVOT diam:     2.20 cm                          2D MOD LV SV:         92                               biplane is LV SV Index:   36                               61.1 %. LVOT Area:     3.80 cm LV IVRT:       102 msec        Diastology                                LV e' medial:    5.33 cm/s                                LV E/e' medial:  10.7 LV Volumes (MOD)               LV e' lateral:   3.37 cm/s LV vol d, MOD    78.9 ml       LV E/e' lateral: 16.9 A2C: LV vol d, MOD    138.0 ml A4C: LV vol s, MOD    29.4 ml A2C: LV vol s, MOD    53.3 ml A4C: LV SV MOD A2C:   49.5 ml LV SV MOD A4C:   138.0 ml LV SV MOD BP:    66.1 ml RIGHT VENTRICLE RV Basal diam:  3.10 cm     PULMONARY VEINS RV S prime:     23.90 cm/s  Diastolic Velocity: 45.00 cm/s  TAPSE (M-mode): 2.3 cm      S/D Velocity:       1.70                             Systolic Velocity:  76.70 cm/s LEFT ATRIUM             Index        RIGHT ATRIUM           Index LA diam:        3.60 cm 1.43 cm/m   RA Area:     12.40 cm LA Vol (A2C):   60.4 ml 23.94 ml/m  RA Volume:   28.60 ml  11.33 ml/m LA Vol (A4C):   43.9 ml 17.40 ml/m LA Biplane Vol: 53.1 ml 21.04 ml/m  AORTIC VALVE AV Area (Vmax):    2.63 cm AV Area (Vmean):   2.62 cm AV Area (VTI):     3.08 cm AV Vmax:  178.00 cm/s AV Vmean:          120.000 cm/s AV VTI:            0.297 m AV Peak Grad:      12.7 mmHg AV Mean Grad:      7.0 mmHg LVOT Vmax:         123.00 cm/s LVOT Vmean:        82.600 cm/s LVOT VTI:          0.241 m LVOT/AV VTI ratio: 0.81 AI PHT:            445 msec  AORTA Ao Root diam: 3.80 cm Ao Asc diam:  3.80 cm MITRAL VALVE MV Area (PHT): 4.89 cm    SHUNTS MV E velocity: 57.00 cm/s  Systemic VTI:  0.24 m MV A velocity: 88.70 cm/s  Systemic Diam: 2.20 cm MV E/A ratio:  0.64 Franck Azobou Tonleu Electronically signed by Joelle Cedars Tonleu Signature Date/Time: 06/20/2024/3:29:27 PM    Final    US  RENAL Result Date: 06/19/2024 EXAM: US  Retroperitoneum Complete, Renal. 06/19/2024 06:47:12 PM TECHNIQUE: Real-time ultrasonography of the retroperitoneum renal was performed. COMPARISON: Prior study dated 01/15/2012. CLINICAL HISTORY: AKI (acute kidney injury). Prior renal transplant. FINDINGS: LEFT KIDNEY/URETER: Native kidneys are not well visualized due to body habitus and overlying bowel gas. RIGHT KIDNEY/URETER: Native kidneys are not well visualized due to body habitus and overlying bowel gas. RIGHT LOWER QUADRANT TRANSPLANT KIDNEY: Right lower quadrant transplant kidney measures 10.8 x 6.6 x 7.8 cm. Normal parenchymal echotexture and thickness. No hydronephrosis or hydroureter. Normal parenchymal flow is demonstrated on color flow Doppler images. A complete renal transplant vascular Doppler study is not obtained. No  calculus. No mass. BLADDER: The visualized bladder is unremarkable. IMPRESSION: 1. Right lower quadrant transplant kidney without hydronephrosis or hydroureter, with normal parenchymal echotexture and thickness and normal parenchymal flow on color Doppler images. 2. No acute findings. 3. Native kidneys are not well visualized. Electronically signed by: Elsie Gravely MD 06/19/2024 10:30 PM EST RP Workstation: [id number]   CT TIBIA FIBULA LEFT WO CONTRAST Result Date: 06/19/2024 EXAM: CT LEFT LOWER EXTREMITY, WITHOUT IV CONTRAST 06/19/2024 10:21:00 PM TECHNIQUE: Axial images were acquired through the left lower extremity without IV contrast. Reformatted images were reviewed. Automated exposure control, iterative reconstruction, and/or weight based adjustment of the mA/kV was utilized to reduce the radiation dose to as low as reasonably achievable. COMPARISON: None available. CLINICAL HISTORY: Soft tissue infection suspected, lower leg, xray done. FINDINGS: BONES AND JOINTS: Mild degenerative changes in the knee and ankle joints. Bones appear intact. No acute fracture or focal osseous lesion. No dislocation. No focal bone sclerosis or bone erosion to suggest evidence of osteomyelitis. The joint spaces are normal. SOFT TISSUES: Diffuse soft tissue edema most prominent around the lateral and posterior aspects of the lower leg and becoming circumferential into the lower leg and ankle region. Edema extends into the dorsum of the foot. No loculated collections are identified. No radiopaque foreign bodies or gas demonstrated in the soft tissues. IMPRESSION: 1. Diffuse soft tissue edema extending from the lower leg into the ankle and dorsum of the foot, without loculated collection, soft tissue gas, or radiopaque foreign body. 2. No acute osseous abnormality or CT evidence of osteomyelitis. Electronically signed by: Elsie Gravely MD 06/19/2024 10:27 PM EST RP Workstation: [id number]   Scheduled Meds:  [START ON  06/22/2024] hydrALAZINE   10 mg Oral BID   hydrALAZINE   25 mg Oral BID   mycophenolate   360 mg Oral BID   potassium chloride   30 mEq Oral Once   tacrolimus   0.5 mg Oral QPM   tacrolimus   1 mg Oral Q breakfast   Continuous Infusions:  sodium chloride  Stopped (06/20/24 1600)   cefTRIAXone  (ROCEPHIN )  IV 2 g (06/21/24 1056)   DAPTOmycin  Stopped (06/20/24 1458)   sodium chloride       LOS: 3 days   Time spent: 50 minutes  Dr. Sherre Location: [place]  [facility] If 7PM-7AM, please contact night-coverage 06/21/2024, 11:52 AM   "

## 2024-06-21 NOTE — Assessment & Plan Note (Addendum)
 Not on anticoagulation, not on rate control or rhythm control medication

## 2024-06-21 NOTE — Assessment & Plan Note (Signed)
 Home furosemide 20 mg daily and losartan  50 mg daily will not be resumed on admission in setting of acute kidney injury Hydralazine  25 mg one-time dose in the evening of 12/21 Hydralazine  10 mg p.o. twice daily starting on 12/22

## 2024-06-21 NOTE — Plan of Care (Signed)
  Problem: Health Behavior/Discharge Planning: Goal: Ability to manage health-related needs will improve Outcome: Progressing   Problem: Clinical Measurements: Goal: Ability to maintain clinical measurements within normal limits will improve Outcome: Progressing Goal: Will remain free from infection Outcome: Progressing Goal: Diagnostic test results will improve Outcome: Progressing Goal: Respiratory complications will improve Outcome: Progressing Goal: Cardiovascular complication will be avoided Outcome: Progressing   Problem: Activity: Goal: Risk for activity intolerance will decrease Outcome: Progressing   Problem: Nutrition: Goal: Adequate nutrition will be maintained Outcome: Progressing   Problem: Coping: Goal: Level of anxiety will decrease Outcome: Progressing   Problem: Elimination: Goal: Will not experience complications related to bowel motility Outcome: Progressing Goal: Will not experience complications related to urinary retention Outcome: Progressing   Problem: Pain Managment: Goal: General experience of comfort will improve and/or be controlled Outcome: Progressing   Problem: Safety: Goal: Ability to remain free from injury will improve Outcome: Progressing   Problem: Skin Integrity: Goal: Risk for impaired skin integrity will decrease Outcome: Progressing   Problem: Clinical Measurements: Goal: Ability to avoid or minimize complications of infection will improve Outcome: Progressing   Problem: Skin Integrity: Goal: Skin integrity will improve Outcome: Progressing

## 2024-06-21 NOTE — Assessment & Plan Note (Signed)
 Status post renal and liver transplant in 2014 Currently on mycophenolate  360 mg p.o. twice daily, tacrolimus  0.5 mg every evening and tacrolimus  1 mg daily with breakfast

## 2024-06-21 NOTE — Assessment & Plan Note (Signed)
 Acute kidney injury on CKD stage IIIa Home losartan  and furosemide will be held Continue sodium chloride  infusion at 85 mL/h, 36 hours ordered Recheck CMP in a.m.

## 2024-06-21 NOTE — Assessment & Plan Note (Signed)
 -  This complicates overall care and prognosis.

## 2024-06-22 DIAGNOSIS — E876 Hypokalemia: Secondary | ICD-10-CM

## 2024-06-22 DIAGNOSIS — Z940 Kidney transplant status: Principal | ICD-10-CM

## 2024-06-22 DIAGNOSIS — Z79899 Other long term (current) drug therapy: Principal | ICD-10-CM

## 2024-06-22 LAB — CULTURE, BLOOD (ROUTINE X 2)
Special Requests: ADEQUATE
Special Requests: ADEQUATE

## 2024-06-22 MED ORDER — MYCOPHENOLATE SODIUM 180 MG PO TBEC
360.0000 mg | DELAYED_RELEASE_TABLET | Freq: Two times a day (BID) | ORAL | Status: DC
  Administered 2024-06-22 – 2024-06-24 (×4): 360 mg via ORAL

## 2024-06-22 MED ORDER — POTASSIUM CITRATE-CITRIC ACID 1100-334 MG/5ML PO SOLN
20.0000 meq | Freq: Once | ORAL | Status: DC
  Filled 2024-06-22: qty 10

## 2024-06-22 MED ORDER — EMPAGLIFLOZIN 10 MG PO TABS
10.0000 mg | ORAL_TABLET | Freq: Every day | ORAL | Status: DC
  Administered 2024-06-23 – 2024-06-24 (×2): 10 mg via ORAL

## 2024-06-22 MED ORDER — TACROLIMUS 0.5 MG PO CAPS
0.5000 mg | ORAL_CAPSULE | Freq: Every evening | ORAL | Status: DC
  Administered 2024-06-22 – 2024-06-23 (×2): 0.5 mg via ORAL

## 2024-06-22 MED ORDER — TACROLIMUS 1 MG PO CAPS
1.0000 mg | ORAL_CAPSULE | Freq: Every day | ORAL | Status: DC
  Administered 2024-06-23 – 2024-06-24 (×2): 1 mg via ORAL

## 2024-06-22 MED ORDER — LOSARTAN POTASSIUM 50 MG PO TABS
50.0000 mg | ORAL_TABLET | Freq: Every day | ORAL | Status: DC
  Administered 2024-06-23 – 2024-06-24 (×2): 50 mg via ORAL

## 2024-06-22 NOTE — Progress Notes (Signed)
 Arrived to the patient    he is alert and oriented   he states he is feeling better   he states he will take the IV antibiotics and nothing else   he states he will take his own medications   all medications were scanned in   photos taken of his feet as well  Dr Sherre wants to use the doppler to find his pulses in his feet   he states he has feeling in his feet and toes  he is advised to leave his feet to air out and that we would wrap them later this afternoon when come back for the second visit     see flow chart for further

## 2024-06-22 NOTE — Plan of Care (Signed)
" °  Problem: Health Behavior/Discharge Planning: Goal: Ability to manage health-related needs will improve Outcome: Progressing   Problem: Clinical Measurements: Goal: Ability to maintain clinical measurements within normal limits will improve Outcome: Progressing   Problem: Activity: Goal: Risk for activity intolerance will decrease Outcome: Progressing   Problem: Nutrition: Goal: Adequate nutrition will be maintained Outcome: Progressing   Problem: Coping: Goal: Level of anxiety will decrease Outcome: Progressing   Problem: Safety: Goal: Ability to remain free from injury will improve Outcome: Progressing   Problem: Skin Integrity: Goal: Risk for impaired skin integrity will decrease Outcome: Progressing   Problem: Clinical Measurements: Goal: Ability to avoid or minimize complications of infection will improve Outcome: Progressing   Problem: Skin Integrity: Goal: Skin integrity will improve Outcome: Progressing   "

## 2024-06-22 NOTE — Progress Notes (Signed)
 This EMT viisted PT to deliver meds and assist with administration of meds. Upon entering the home PT was found in the lower floor bedroom area. This is where they spend most of their time. PT and PT's wife greeted me as I entered. PT vitals where captured and documented. Pt's meds where verified via RN Danielle. Meds administered: hydralazine . Patient states to want to discuss meds with Physician in AM. Pt had no further issues or concerns at this time. Vitals taking after med administration. Pt stated to be ready for bed. Pt left in care of wife and himself. Visited complete

## 2024-06-22 NOTE — Progress Notes (Signed)
 Arrived to patient   alert and oriented   he is mobile without assistance   IV antibiotic given   Unna boots applied to both feet and legs   vitals were stable   he states he feels fine   doppler was used to find pulses in each foot    they were both loud and regular   patient is asked if there is anything else we could do for him while we were there    he stated he was good for now   he is advised to call the virtual RN tonight to take his night time meds    he agreed to do so   he was also told if there was anything that he needed at all to call the RN as well

## 2024-06-22 NOTE — Progress Notes (Addendum)
 2024--Inbound call received. RN introduction, update on EMT ETA, overnight monitoring plan, and assessment completed. [facility] contact information reviewed.     2058--Video Call- Patient sitting in recliner awaiting EMT arrival. Medication administration completed for one medication, and education provided. Patient reported and confirmed taking his own mycophenolate  (MYFORTIC ) EC tablet 360 mg and tacrolimus  (PROGRAF ) capsule 0.5 mg prior to [facility] staff assisted per protocol and the self-administered medication was unwitnessed. Patient request to review all medication during upcoming provider visit. Patient informed message to provider has been sent. Patient reported and confirmed all questions were answered. Patient encouraged to contact [facility] as needed.    2120-[facility] Provider notified of patient request and unwitnessed medication administration.

## 2024-06-22 NOTE — Progress Notes (Signed)
 Video call completed with patient. Patient requested to take his bedtime medications early as he's ready to go to bed. RN witnessed patient taking his patient supplied scheduled meds. Patient states that he was also taking half of a fluid pill. When question by RN patient states that it's his lasix that he has been on for months. Furosemide not scheduled for patient while in hospital. No other needs noted at this time. Patient encouraged to call RN via phone or tablet if needed overnight.

## 2024-06-22 NOTE — Progress Notes (Signed)
 This clinical research associate and EMT, Garrel went to the patients house to assist Paramedic, Barnie with wound care. Dr. Sherre was on the tablet doing visit with the patient, this writer unwrapped the right leg, showed Dr. Sherre via the tablet what his skin looked liked. A new open area above the right ankle noted, Dr. Sherre was shown the spot, dime size. This clinical research associate took sports administrator and uploaded into [facility]. Patient noted with +4 edema in his feet. Dr. Sherre wants patients feet to be deplored to find pedal pulses. Patients legs were left unwrapped until the afternoon visit to be able to use doppler machine. This clinical research associate also uploaded photos of specific medications and the bottles in EPIC. -------------------------------------------------------- Nat SQUIBB. Franchot, EMT

## 2024-06-22 NOTE — Progress Notes (Addendum)
 " PROGRESS NOTE - Video Telemedicine  [name] [name]  FMW:[id number] DOB: January 16, 1946 DOA: 06/18/2024 PCP: Macie Benedetta POUR, MD   [name] is a 78 year old male with history of CKD stage IIIb,, venous stasis, atrial fibrillation not on anticoagulation, history of IgA nephropathy, history of native right renal cell carcinoma status post cryoablation, end-stage renal disease status post renal transplant, currently CKD stage IIIb, morbid obesity.    06/18/2024: Patient presents to the ED for chief concerns of left leg swelling missed for 2 weeks.  In the ED, patient received 1 dose of levofloxacin  750 mg IV at 23:14 hrs.  Vancomycin  1 g, 1 dose at 2312.  12/18: Patient admitted to hospitalist service for left lower extremity cellulitis.  Blood cultures x 2 collected. Patient started on broad-spectrum antibiotics:   Early morning on 12/19: Meropenem  1 g one-time dose at 3:38 AM, vancomycin  1.5 g at 1:24 AM.  Unasyn  every 6 hours per pharmacy initiated at 1211.  Daptomycin  4 mg IV one-time dose at 2033.  12/20: Patient transferred to H@H  service. Blood cultures grew positive for Staph epidermidis in 2 sets.  Complete echo done which was read as estimate ejection fraction 60 to 65%, grade 1 diastolic dysfunction.  12/21: I assumed care of patient. Day 3 of abx. Ceftriaxone  2 g IV daily added per ID consult note. ID specialist, Dr. Dennise states they will keep pt on list and follow peripherally.  ID will reach out to therapy any changes.  IV, infiltrated. Medic will attempt IV and if needed, pt will come back to the ED.  12/22: day 4 of abx. Renal function improved on BMP yesterday. Home losartan  50 mg daily, Jardiance  10 mg daily were resumed. Potassium level noted to be 3.4 on labs 12/21, patient states he will eat two bananas.  Assessment & Plan:   Principal Problem:   Cellulitis of left lower extremity Active Problems:   Renal transplant, status post   Acute renal failure superimposed  on stage 3a chronic kidney disease (HCC)   Positive blood cultures   Immunosuppressed status   Liver transplant recipient (HCC)   PAF (paroxysmal atrial fibrillation) (HCC)   Morbid obesity (HCC)   Hyperlipidemia   Lymphedema   Essential hypertension   Hypokalemia   Assessment and Plan:  * Cellulitis of left lower extremity 12/21: Day 3 of antibiotic Continue with daptomycin  as recommended by ID specialist Unna boot 2 times per week Next Boot change is 12/22, discussed with medic that I will need to see patient skin and images in the chart before Unna boot placement Check CBC in the AM  Positive blood cultures With staph epidermidis from 2 sets Repeat blood cultures have been obtained on 12/20, at this time no growth to date Complete echo on 06/20/2024: Read as estimate ejection fraction 60 to 65%, grade 1 diastolic dysfunction Continue with ceftriaxone  as ordered by ID specialist Discussed case with ID specialist, Dr. Dennise who states they will keep patient on their list and follow peripherally.  ID will reach out should repeat blood cultures change.  Acute renal failure superimposed on stage 3a chronic kidney disease (HCC) Acute kidney injury on CKD stage IIIa Home losartan  and furosemide will be held Continue sodium chloride  infusion at 85 mL/h, 36 hours ordered Recheck CMP in a.m.  Liver transplant recipient [facility]) Continue mycophenolate  and tacrolimus  per home dosing Continue outpatient follow-up with transplant specialist as appropriate  Immunosuppressed status Status post renal and liver transplant in 2014 Currently on  mycophenolate  360 mg p.o. twice daily, tacrolimus  0.5 mg every evening and tacrolimus  1 mg daily with breakfast  Essential hypertension Home furosemide 20 mg daily and losartan  50 mg daily will not be resumed on admission in setting of acute kidney injury Hydralazine  25 mg one-time dose in the evening of 12/21 Hydralazine  10 mg p.o. twice daily  starting on 12/22  Hyperlipidemia Patient currently not on anticholesterol medication  Morbid obesity (HCC) This complicates overall care and prognosis.   PAF (paroxysmal atrial fibrillation) (HCC) Not on anticoagulation, not on rate control or rhythm control medication  Hypokalemia Potassium chloride  supplementation has been offered and patient declines He states he will eat 2 bananas BMP in the AM  DVT prophylaxis: ambulation as tolerated. Patient refused DVT prophylaxis Code Status: full code Family Communication: spouse was in/out of the room. Disposition Plan: pending s/s of blood cx Level of care: Hospital at Home Med-Surg  Consultants:  ID, pt, ot, pharmacy  Antimicrobials: 12/24 antibiotic.  Subjective:  At bedside, via video telemedicine encounter, patient was able to confirm his first and last name, his date of birth.  He does not appear to be in acute distress.  Reports he did not sleep as well last night as he did evening of the 20th.  He slept better than in the hospital.  I was able to visualize his lower extremities and toes.  Initially they appeared blue however upon patient ambulation and change in lighting, skin appears mottled mixed with red and purple and toes appeared pale in color.  Pulses were not able to be appreciated by palpation.  Medic returned in the evening visit with Doppler and was able to appreciate pulses.  Objective: Vitals:   06/21/24 2112 06/21/24 2147 06/22/24 0900 06/22/24 1517  BP: (!) 148/66 124/73 123/72 (!) 164/74  Pulse: 88 92 83 88  Resp: 18 18 18    Temp: 99.7 F (37.6 C)   97.8 F (36.6 C)  TempSrc: Oral   Oral  SpO2: 96% 97% 98% 97%  Weight:      Height:        Intake/Output Summary (Last 24 hours) at 06/22/2024 1614 Last data filed at 06/21/2024 2112 Gross per 24 hour  Intake 120 ml  Output --  Net 120 ml   Filed Weights   06/19/24 0009  Weight: 135.1 kg   Examination was completed with the assistance of:  [employer], charity fundraiser, who was present in the house during the virtual encounter:  General exam: Appears calm and comfortable  Respiratory system: Clear to auscultation. Respiratory effort normal. Cardiovascular system: S1 & S2 heard, RRR. No murmurs. Gastrointestinal system: Morbidly obese abdomen, abdomen is nondistended, soft and nontender. No organomegaly or masses felt. Normal bowel sounds heard. Central nervous system: Alert and oriented. No focal neurological deficits. Extremities: Symmetric 5 x 5 power.  Pulses were able to be appreciated on evening visit with Doppler.  Bilateral lower extremity had 4+ pitting edema. Skin: Bilateral lower extremity mottled skin changes consistent with venous stasis and lymphedema. Psychiatry: Judgement and insight appear normal. Mood & affect appropriate.   Data Reviewed: I have personally reviewed following labs and imaging studies  CBC: Recent Labs  Lab 06/18/24 1903 06/19/24 0344 06/20/24 0632 06/21/24 0500  WBC 8.1 6.2 5.1 4.7  NEUTROABS 6.3  --   --   --   HGB 14.9 13.4 13.1 13.1  HCT 44.4 40.2 39.2 39.2  MCV 89.9 90.3 89.1 90.1  PLT 172 143* 157 157  Basic Metabolic Panel: Recent Labs  Lab 06/18/24 1903 06/19/24 0344 06/19/24 1814 06/20/24 0632 06/21/24 1230  NA 137 139 138 142 138  K 3.3* 3.2* 3.2* 3.3* 3.4*  CL 100 101 103 106 104  CO2 23 24 21* 21* 21*  GLUCOSE 187* 134* 149* 145* 148*  BUN 39* 38* 34* 28* 20  CREATININE 1.88* 1.85* 1.78* 1.73* 1.42*  CALCIUM 9.0 8.6* 8.6* 8.4* 8.6*  MG  --  2.2  --  2.1  --    GFR: Estimated Creatinine Clearance: 63 mL/min (A) (by C-G formula based on SCr of 1.42 mg/dL (H)).  Liver Function Tests: Recent Labs  Lab 06/18/24 1903 06/19/24 1814 06/21/24 1230  AST 18 15 17   ALT 19 14 13   ALKPHOS 117 94 89  BILITOT 1.0 0.7 0.4  PROT 7.1 6.2* 5.9*  ALBUMIN  3.5 3.1* 2.9*   Cardiac Enzymes: Recent Labs  Lab 06/20/24 9367  CKTOTAL 65   Sepsis Labs: Recent Labs  Lab  06/18/24 2305  LATICACIDVEN 1.3   Recent Results (from the past 240 hours)  Blood culture (routine x 2)     Status: Abnormal   Collection Time: 06/18/24 10:51 PM   Specimen: BLOOD RIGHT HAND  Result Value Ref Range Status   Specimen Description   Final    BLOOD RIGHT HAND Performed at [facility], [address] [place], [place] [address]    Special Requests   Final    BOTTLES DRAWN AEROBIC AND ANAEROBIC Blood Culture adequate volume Performed at [facility], [address] [place], [place] [address]    Culture  Setup Time   Final    GRAM POSITIVE COCCI IN BOTH AEROBIC AND ANAEROBIC BOTTLES CRITICAL RESULT CALLED TO, READ BACK BY AND VERIFIED WITH: L POINTDEXTER PHARM D 06/20/2024 @0034  BY DD Performed at [facility], [address], [place], [place] [address]    Culture STAPHYLOCOCCUS EPIDERMIDIS (A)  Final   Report Status 06/22/2024 FINAL  Final   Organism ID, Bacteria STAPHYLOCOCCUS EPIDERMIDIS  Final      Susceptibility   Staphylococcus epidermidis - MIC*    CIPROFLOXACIN 4 RESISTANT Resistant     ERYTHROMYCIN >=8 RESISTANT Resistant     GENTAMICIN <=0.5 SENSITIVE Sensitive     OXACILLIN >=4 RESISTANT Resistant     TETRACYCLINE >=16 RESISTANT Resistant     VANCOMYCIN  1 SENSITIVE Sensitive     TRIMETH/SULFA <=10 SENSITIVE Sensitive     CLINDAMYCIN >=8 RESISTANT Resistant     RIFAMPIN <=0.5 SENSITIVE Sensitive     Inducible Clindamycin NEGATIVE Sensitive     * STAPHYLOCOCCUS EPIDERMIDIS  Blood culture (routine x 2)     Status: Abnormal   Collection Time: 06/18/24 10:51 PM   Specimen: BLOOD LEFT ARM  Result Value Ref Range Status   Specimen Description BLOOD LEFT ARM  Final   Special Requests   Final    BOTTLES DRAWN AEROBIC AND ANAEROBIC Blood Culture adequate volume   Culture  Setup Time   Final    GRAM POSITIVE COCCI IN BOTH AEROBIC AND ANAEROBIC BOTTLES CRITICAL VALUE NOTED.  VALUE IS CONSISTENT WITH PREVIOUSLY  REPORTED AND CALLED VALUE.    Culture STAPHYLOCOCCUS EPIDERMIDIS (A)  Final   Report Status 06/22/2024 FINAL  Final   Organism ID, Bacteria STAPHYLOCOCCUS EPIDERMIDIS  Final      Susceptibility   Staphylococcus epidermidis - MIC*    CIPROFLOXACIN <=0.5 SENSITIVE Sensitive     ERYTHROMYCIN >=8 RESISTANT Resistant  GENTAMICIN <=0.5 SENSITIVE Sensitive     OXACILLIN >=4 RESISTANT Resistant     TETRACYCLINE <=1 SENSITIVE Sensitive     VANCOMYCIN  1 SENSITIVE Sensitive     TRIMETH/SULFA <=10 SENSITIVE Sensitive     CLINDAMYCIN <=0.25 SENSITIVE Sensitive     RIFAMPIN <=0.5 SENSITIVE Sensitive     Inducible Clindamycin NEGATIVE Sensitive     * STAPHYLOCOCCUS EPIDERMIDIS  Blood Culture ID Panel (Reflexed)     Status: Abnormal   Collection Time: 06/18/24 10:51 PM  Result Value Ref Range Status   Enterococcus faecalis NOT DETECTED NOT DETECTED Final   Enterococcus Faecium NOT DETECTED NOT DETECTED Final   Listeria monocytogenes NOT DETECTED NOT DETECTED Final   Staphylococcus species DETECTED (A) NOT DETECTED Final    Comment: CRITICAL RESULT CALLED TO, READ BACK BY AND VERIFIED WITH: L POINTDEXTER PHARM D 06/20/2024 @0034  BY DD    Staphylococcus aureus (BCID) NOT DETECTED NOT DETECTED Final   Staphylococcus epidermidis DETECTED (A) NOT DETECTED Final    Comment: Methicillin (oxacillin) resistant coagulase negative staphylococcus. Possible blood culture contaminant (unless isolated from more than one blood culture draw or clinical case suggests pathogenicity). No antibiotic treatment is indicated for blood  culture contaminants. CRITICAL RESULT CALLED TO, READ BACK BY AND VERIFIED WITH: L POINTDEXTER PHARM D 06/20/2024 @0034  BY DD    Staphylococcus lugdunensis NOT DETECTED NOT DETECTED Final   Streptococcus species NOT DETECTED NOT DETECTED Final   Streptococcus agalactiae NOT DETECTED NOT DETECTED Final   Streptococcus pneumoniae NOT DETECTED NOT DETECTED Final   Streptococcus  pyogenes NOT DETECTED NOT DETECTED Final   A.calcoaceticus-baumannii NOT DETECTED NOT DETECTED Final   Bacteroides fragilis NOT DETECTED NOT DETECTED Final   Enterobacterales NOT DETECTED NOT DETECTED Final   Enterobacter cloacae complex NOT DETECTED NOT DETECTED Final   Escherichia coli NOT DETECTED NOT DETECTED Final   Klebsiella aerogenes NOT DETECTED NOT DETECTED Final   Klebsiella oxytoca NOT DETECTED NOT DETECTED Final   Klebsiella pneumoniae NOT DETECTED NOT DETECTED Final   Proteus species NOT DETECTED NOT DETECTED Final   Salmonella species NOT DETECTED NOT DETECTED Final   Serratia marcescens NOT DETECTED NOT DETECTED Final   Haemophilus influenzae NOT DETECTED NOT DETECTED Final   Neisseria meningitidis NOT DETECTED NOT DETECTED Final   Pseudomonas aeruginosa NOT DETECTED NOT DETECTED Final   Stenotrophomonas maltophilia NOT DETECTED NOT DETECTED Final   Candida albicans NOT DETECTED NOT DETECTED Final   Candida auris NOT DETECTED NOT DETECTED Final   Candida glabrata NOT DETECTED NOT DETECTED Final   Candida krusei NOT DETECTED NOT DETECTED Final   Candida parapsilosis NOT DETECTED NOT DETECTED Final   Candida tropicalis NOT DETECTED NOT DETECTED Final   Cryptococcus neoformans/gattii NOT DETECTED NOT DETECTED Final   Methicillin resistance mecA/C DETECTED (A) NOT DETECTED Final    Comment: CRITICAL RESULT CALLED TO, READ BACK BY AND VERIFIED WITH: L POINTDEXTER PHARM D 06/20/2024 @0034  BY DD Performed at [facility], [address] [place], [place] [address]   Culture, blood (Routine X 2) w Reflex to ID Panel     Status: None (Preliminary result)   Collection Time: 06/20/24 11:07 AM   Specimen: BLOOD LEFT ARM  Result Value Ref Range Status   Specimen Description   Final    BLOOD LEFT ARM BOTTLES DRAWN AEROBIC ONLY Performed at [facility], [address] [place], [place] [address]    Special Requests   Final    Blood Culture adequate  volume  Performed at [facility], [address] [place], [place] [address]    Culture   Final    NO GROWTH 2 DAYS Performed at [facility], [address], [place], [place] [address]    Report Status PENDING  Incomplete  Culture, blood (Routine X 2) w Reflex to ID Panel     Status: None (Preliminary result)   Collection Time: 06/20/24 11:13 AM   Specimen: BLOOD LEFT ARM  Result Value Ref Range Status   Specimen Description   Final    BLOOD LEFT ARM BOTTLES DRAWN AEROBIC ONLY Performed at [facility], [address] [place], [place] [address]    Special Requests   Final    Blood Culture adequate volume Performed at [facility], [address] [place], [place] [address]    Culture   Final    NO GROWTH 2 DAYS Performed at [facility], [address], [place], [place] [address]    Report Status PENDING  Incomplete    Radiology Studies: No results found.  Scheduled Meds:  [START ON 06/23/2024] empagliflozin   10 mg Oral Daily   hydrALAZINE   10 mg Oral BID   [START ON 06/23/2024] losartan   50 mg Oral Daily   mycophenolate   360 mg Oral BID   tacrolimus   0.5 mg Oral QPM   [START ON 06/23/2024] tacrolimus   1 mg Oral Q breakfast   Continuous Infusions:  cefTRIAXone  (ROCEPHIN )  IV Stopped (06/22/24 1005)   DAPTOmycin  Stopped (06/22/24 1549)    LOS: 4 days   Time spent: 50 minutes  Dr. Sherre Location: [place]  [facility] If 7PM-7AM, please contact night-coverage 06/22/2024, 4:14 PM   "

## 2024-06-22 NOTE — Progress Notes (Signed)
 Physical Therapy Note  Initially evaluated by our team 12/20, he was ambulating 120 feet with supervision and no assistive device. No DME needs or PT follow-up was recommended. I touched base with RN this morning and patient denies any further physical therapy follow-up. Will sign-off at this time. Thank you for this consult.   Leontine Roads, PT, DPT [facility]  Rehabilitation Services Physical Therapist Office: [phone] Website: [email or web address]

## 2024-06-22 NOTE — Plan of Care (Signed)
  Problem: Health Behavior/Discharge Planning: Goal: Ability to manage health-related needs will improve Outcome: Progressing   Problem: Clinical Measurements: Goal: Ability to maintain clinical measurements within normal limits will improve Outcome: Progressing Goal: Will remain free from infection Outcome: Progressing Goal: Diagnostic test results will improve Outcome: Progressing Goal: Respiratory complications will improve Outcome: Progressing Goal: Cardiovascular complication will be avoided Outcome: Progressing   Problem: Activity: Goal: Risk for activity intolerance will decrease Outcome: Progressing   Problem: Nutrition: Goal: Adequate nutrition will be maintained Outcome: Progressing   Problem: Coping: Goal: Level of anxiety will decrease Outcome: Progressing   Problem: Elimination: Goal: Will not experience complications related to bowel motility Outcome: Progressing Goal: Will not experience complications related to urinary retention Outcome: Progressing   Problem: Pain Managment: Goal: General experience of comfort will improve and/or be controlled Outcome: Progressing   Problem: Safety: Goal: Ability to remain free from injury will improve Outcome: Progressing   Problem: Skin Integrity: Goal: Risk for impaired skin integrity will decrease Outcome: Progressing   Problem: Clinical Measurements: Goal: Ability to avoid or minimize complications of infection will improve Outcome: Progressing   Problem: Skin Integrity: Goal: Skin integrity will improve Outcome: Progressing

## 2024-06-22 NOTE — Progress Notes (Addendum)
 Patient states he does not want to take the medications provided by the hospital at home program. He would prefer to take his own home medications. Patient was instructed not to take his own medications without staff supervision. Pt agreeable to plan. Pt is also agreeable to continue with the IV antibiotics.  @1130 : Pictures of patient's home meds uploaded to chart. Dr. Sherre and Norleen (pharmacy) made aware.

## 2024-06-22 NOTE — Assessment & Plan Note (Signed)
 Potassium chloride  supplementation has been offered and patient declines He states he will eat 2 bananas BMP in the AM

## 2024-06-22 NOTE — Plan of Care (Signed)
" °  Problem: Clinical Measurements: Goal: Ability to maintain clinical measurements within normal limits will improve Outcome: Progressing   Problem: Clinical Measurements: Goal: Will remain free from infection Outcome: Progressing   Problem: Clinical Measurements: Goal: Diagnostic test results will improve Outcome: Progressing   Problem: Elimination: Goal: Will not experience complications related to bowel motility Outcome: Progressing Goal: Will not experience complications related to urinary retention Outcome: Progressing   Problem: Pain Managment: Goal: General experience of comfort will improve and/or be controlled Outcome: Progressing   Problem: Safety: Goal: Ability to remain free from injury will improve Outcome: Progressing   Problem: Skin Integrity: Goal: Risk for impaired skin integrity will decrease Outcome: Progressing   "

## 2024-06-23 LAB — BASIC METABOLIC PANEL WITH GFR
Anion gap: 13 (ref 5–15)
BUN: 14 mg/dL (ref 8–23)
CO2: 23 mmol/L (ref 22–32)
Calcium: 8.4 mg/dL — ABNORMAL LOW (ref 8.9–10.3)
Chloride: 105 mmol/L (ref 98–111)
Creatinine, Ser: 1.35 mg/dL — ABNORMAL HIGH (ref 0.61–1.24)
GFR, Estimated: 54 mL/min — ABNORMAL LOW
Glucose, Bld: 138 mg/dL — ABNORMAL HIGH (ref 70–99)
Potassium: 3.4 mmol/L — ABNORMAL LOW (ref 3.5–5.1)
Sodium: 141 mmol/L (ref 135–145)

## 2024-06-23 LAB — MAGNESIUM: Magnesium: 1.9 mg/dL (ref 1.7–2.4)

## 2024-06-23 MED ORDER — POTASSIUM CHLORIDE CRYS ER 20 MEQ PO TBCR
20.0000 meq | EXTENDED_RELEASE_TABLET | Freq: Once | ORAL | Status: AC
  Administered 2024-06-23: 20 meq via ORAL
  Filled 2024-06-23: qty 1

## 2024-06-23 NOTE — Progress Notes (Signed)
 H@H  medic out for morning visit with patient. On arrival I was welcomed in by the patient. See flowsheet for assessment. The patient denies pain and has no other complaints. A video visit with the provider and RN was conducted and completed. IV abx were given as ordered. Labs were drawn as ordered. No other tasks needed this visit. The patient was left in care of himself. H@H  visit complete.

## 2024-06-23 NOTE — Progress Notes (Signed)
 H@H  medic out for second visit with patient this evening. On arrival, I was welcomed into the home by the patient. On assessment, lung sounds clear and equal bilaterally, heart sounds S1, S2, abdomen soft non-tender x4 quadrants, and upper extremity pulses strong and regular. The pt still has swelling to the right hand with bruising to bilateral arms. Pt had no complaints this evening. A new IV was started after the old IV infiltrated. IV abx was administered. No other tasks needed at this time. The patient was left in care of himself. H@H  visit complete.

## 2024-06-23 NOTE — Progress Notes (Signed)
 Completed virtual rounds with MD,paramedic at patient bedside. POC reviewed and discussed ,patient voices understanding and agreement. Pt reminded to call RN for any needs, RN and MD available at all times. Pt voices understanding. Pt aware of next planned visit and next call from RN.

## 2024-06-23 NOTE — Progress Notes (Addendum)
 " PROGRESS NOTE - Video Telemedicine  [name] [name]  FMW:[id number] DOB: 03/16/1946 DOA: 06/18/2024 PCP: Macie Benedetta POUR, MD   [name] is a 78 year old male with history of CKD stage IIIb,, venous stasis, atrial fibrillation not on anticoagulation, history of IgA nephropathy, history of native right renal cell carcinoma status post cryoablation, end-stage renal disease status post renal transplant, currently CKD stage IIIb, morbid obesity.    06/18/2024: Patient presents to the ED for chief concerns of left leg swelling missed for 2 weeks.  In the ED, patient received 1 dose of levofloxacin  750 mg IV at 23:14 hrs.  Vancomycin  1 g, 1 dose at 2312.  12/18: Patient admitted to hospitalist service for left lower extremity cellulitis.  Blood cultures x 2 collected. Patient started on broad-spectrum antibiotics:   Early morning on 12/19: Meropenem  1 g one-time dose at 3:38 AM, vancomycin  1.5 g at 1:24 AM.  Unasyn  every 6 hours per pharmacy initiated at 1211.  Daptomycin  4 mg IV one-time dose at 2033.  12/20: Patient transferred to H@H  service. Blood cultures grew positive for Staph epidermidis in 2 sets.  Complete echo done which was read as estimate ejection fraction 60 to 65%, grade 1 diastolic dysfunction.  12/21: I assumed care of patient. Day 3 of abx. Ceftriaxone  2 g IV daily added per ID consult note. ID specialist, Dr. Dennise states they will keep pt on list and follow peripherally.  ID will reach out to therapy any changes.  IV, infiltrated. Medic will attempt IV and if needed, pt will come back to the ED.  12/22: day 4 of abx. Renal function improved on BMP yesterday. Home losartan  50 mg daily, Jardiance  10 mg daily were resumed. Potassium level noted to be 3.4 on labs 12/21, patient states he will eat two bananas.  12/23: day 4 of abx. BMP and serum magnesium labs today. Patient states he is doing well and that he is more used to our service now. Blood cx from 06/18/24, s/s has  not resulted today. Potassium chloride  20 mEq PO ordered for evening. Though he may refused it.  Home Health for complex wound for Unna boot changes (2x/week) placed. Next time wound changes is due is Thursday.  Assessment & Plan:   Principal Problem:   Cellulitis of left lower extremity Active Problems:   Renal transplant, status post   Acute renal failure superimposed on stage 3a chronic kidney disease (HCC)   Positive blood cultures   Immunosuppressed status   Liver transplant recipient (HCC)   Hypokalemia   PAF (paroxysmal atrial fibrillation) (HCC)   Morbid obesity (HCC)   Hyperlipidemia   Lymphedema   Essential hypertension   Assessment and Plan:  * Cellulitis of left lower extremity 12/21: Day 3 of antibiotic Continue with daptomycin  as recommended by ID specialist Unna boot 2 times per week Next Boot change is 12/22, discussed with medic that I will need to see patient skin and images in the chart before Unna boot placement Check CBC in the AM 12/23: day 5 of abx  Positive blood cultures With staph epidermidis from 2 sets Repeat blood cultures have been obtained on 12/20, at this time no growth to date Complete echo on 06/20/2024: Read as estimate ejection fraction 60 to 65%, grade 1 diastolic dysfunction Continue with ceftriaxone  as ordered by ID specialist Discussed case with ID specialist, Dr. Dennise who states they will keep patient on their list and follow peripherally.  ID will reach out should repeat blood  cultures change.  12/23: blood cx s/s has not resulted as of 16:25.  Acute renal failure superimposed on stage 3a chronic kidney disease (HCC) Acute kidney injury on CKD stage IIIa Home losartan  and furosemide will be held Continue sodium chloride  infusion at 85 mL/h, 36 hours ordered Recheck CMP in a.m.  Hypokalemia 12/22: Potassium chloride  supplementation has been offered and patient declines He states he will eat 2 bananas BMP in the AM  12/23:  potassium chloride  20 mEq PO once.  Liver transplant recipient [facility]) Continue mycophenolate  and tacrolimus  per home dosing Continue outpatient follow-up with transplant specialist as appropriate  Immunosuppressed status Status post renal and liver transplant in 2014 Currently on mycophenolate  360 mg p.o. twice daily, tacrolimus  0.5 mg every evening and tacrolimus  1 mg daily with breakfast  Essential hypertension Home furosemide 20 mg daily and losartan  50 mg daily will not be resumed on admission in setting of acute kidney injury Hydralazine  25 mg one-time dose in the evening of 12/21 Hydralazine  10 mg p.o. twice daily starting on 12/22  Hyperlipidemia Patient currently not on anticholesterol medication  Morbid obesity (HCC) This complicates overall care and prognosis.   PAF (paroxysmal atrial fibrillation) (HCC) Not on anticoagulation, not on rate control or rhythm control medication  DVT prophylaxis: ambulation as tolerated. Patient declines DVT ppx. Code Status: full code Family Communication: family in home per patient Disposition Plan:  Level of care: Hospital at Home Med-Surg Wound care: Unna boot change 2x/week. Last chang was 12/22.  Consultants:  ID, PT, OT, TOC, pharmacy  Antimicrobials: 06/23/24: day 5 of abx   Subjective:  At bedside, via video telemedicine encounter, patient is aware and alert to self and dob. He does not appear in acute distress.  Reports he ate 4 bananas yesterday.  He reports overall he is doing well.  Much better than when he did in the hospital.  And he is more used to our services in the program and with everyone being in the home.  Objective: Vitals:   06/21/24 2147 06/22/24 0900 06/22/24 1517 06/23/24 1025  BP: 124/73 123/72 (!) 164/74 132/79  Pulse: 92 83 88 84  Resp: 18 18  16   Temp:   97.8 F (36.6 C) 98.2 F (36.8 C)  TempSrc:   Oral Oral  SpO2: 97% 98% 97% 96%  Weight:      Height:       No intake or output data in  the 24 hours ending 06/23/24 1623 Filed Weights   06/19/24 0009  Weight: 135.1 kg    Examination was completed with the assistance of: Tyrell Lewis, parapmedic, who was present in the house during the virtual encounter:  General exam: Appears calm and comfortable  Respiratory system: Clear to auscultation. Respiratory effort normal. Cardiovascular system: S1 & S2 heard, RRR. No murmurs.  Gastrointestinal system: obese abdomen, Abdomen is nondistended, soft and nontender. No organomegaly or masses felt. Normal bowel sounds heard. Central nervous system: Alert and oriented. No focal neurological deficits. Extremities: Symmetric 5 x 5 power. Skin: No rashes, lesions or ulcers Psychiatry: Judgement and insight appear normal. Mood & affect appropriate.   Data Reviewed: I have personally reviewed following labs and imaging studies  CBC: Recent Labs  Lab 06/18/24 1903 06/19/24 0344 06/20/24 0632 06/21/24 0500  WBC 8.1 6.2 5.1 4.7  NEUTROABS 6.3  --   --   --   HGB 14.9 13.4 13.1 13.1  HCT 44.4 40.2 39.2 39.2  MCV 89.9 90.3 89.1 90.1  PLT  172 143* 157 157   Basic Metabolic Panel: Recent Labs  Lab 06/19/24 0344 06/19/24 1814 06/20/24 0632 06/21/24 1230 06/23/24 1105  NA 139 138 142 138 141  K 3.2* 3.2* 3.3* 3.4* 3.4*  CL 101 103 106 104 105  CO2 24 21* 21* 21* 23  GLUCOSE 134* 149* 145* 148* 138*  BUN 38* 34* 28* 20 14  CREATININE 1.85* 1.78* 1.73* 1.42* 1.35*  CALCIUM 8.6* 8.6* 8.4* 8.6* 8.4*  MG 2.2  --  2.1  --  1.9   GFR: Estimated Creatinine Clearance: 66.2 mL/min (A) (by C-G formula based on SCr of 1.35 mg/dL (H)). Liver Function Tests: Recent Labs  Lab 06/18/24 1903 06/19/24 1814 06/21/24 1230  AST 18 15 17   ALT 19 14 13   ALKPHOS 117 94 89  BILITOT 1.0 0.7 0.4  PROT 7.1 6.2* 5.9*  ALBUMIN  3.5 3.1* 2.9*   Cardiac Enzymes: Recent Labs  Lab 06/20/24 9367  CKTOTAL 65   Sepsis Labs: Recent Labs  Lab 06/18/24 2305  LATICACIDVEN 1.3   Recent  Results (from the past 240 hours)  Blood culture (routine x 2)     Status: Abnormal   Collection Time: 06/18/24 10:51 PM   Specimen: BLOOD RIGHT HAND  Result Value Ref Range Status   Specimen Description   Final    BLOOD RIGHT HAND Performed at [facility], [address], [place], [place] [address]    Special Requests   Final    BOTTLES DRAWN AEROBIC AND ANAEROBIC Blood Culture adequate volume Performed at [facility], [address], [place], [place] [address]    Culture  Setup Time   Final    GRAM POSITIVE COCCI IN BOTH AEROBIC AND ANAEROBIC BOTTLES CRITICAL RESULT CALLED TO, READ BACK BY AND VERIFIED WITH: L POINTDEXTER PHARM D 06/20/2024 @0034  BY DD Performed at [facility], [address] [place], [place] [address]    Culture STAPHYLOCOCCUS EPIDERMIDIS (A)  Final   Report Status 06/22/2024 FINAL  Final   Organism ID, Bacteria STAPHYLOCOCCUS EPIDERMIDIS  Final      Susceptibility   Staphylococcus epidermidis - MIC*    CIPROFLOXACIN 4 RESISTANT Resistant     ERYTHROMYCIN >=8 RESISTANT Resistant     GENTAMICIN <=0.5 SENSITIVE Sensitive     OXACILLIN >=4 RESISTANT Resistant     TETRACYCLINE >=16 RESISTANT Resistant     VANCOMYCIN  1 SENSITIVE Sensitive     TRIMETH/SULFA <=10 SENSITIVE Sensitive     CLINDAMYCIN >=8 RESISTANT Resistant     RIFAMPIN <=0.5 SENSITIVE Sensitive     Inducible Clindamycin NEGATIVE Sensitive     * STAPHYLOCOCCUS EPIDERMIDIS  Blood culture (routine x 2)     Status: Abnormal   Collection Time: 06/18/24 10:51 PM   Specimen: BLOOD LEFT ARM  Result Value Ref Range Status   Specimen Description BLOOD LEFT ARM  Final   Special Requests   Final    BOTTLES DRAWN AEROBIC AND ANAEROBIC Blood Culture adequate volume   Culture  Setup Time   Final    GRAM POSITIVE COCCI IN BOTH AEROBIC AND ANAEROBIC BOTTLES CRITICAL VALUE NOTED.  VALUE IS CONSISTENT WITH PREVIOUSLY REPORTED AND CALLED VALUE.    Culture  STAPHYLOCOCCUS EPIDERMIDIS (A)  Final   Report Status 06/22/2024 FINAL  Final   Organism ID, Bacteria STAPHYLOCOCCUS EPIDERMIDIS  Final      Susceptibility   Staphylococcus epidermidis - MIC*    CIPROFLOXACIN <=0.5 SENSITIVE Sensitive     ERYTHROMYCIN >=8  RESISTANT Resistant     GENTAMICIN <=0.5 SENSITIVE Sensitive     OXACILLIN >=4 RESISTANT Resistant     TETRACYCLINE <=1 SENSITIVE Sensitive     VANCOMYCIN  1 SENSITIVE Sensitive     TRIMETH/SULFA <=10 SENSITIVE Sensitive     CLINDAMYCIN <=0.25 SENSITIVE Sensitive     RIFAMPIN <=0.5 SENSITIVE Sensitive     Inducible Clindamycin NEGATIVE Sensitive     * STAPHYLOCOCCUS EPIDERMIDIS  Blood Culture ID Panel (Reflexed)     Status: Abnormal   Collection Time: 06/18/24 10:51 PM  Result Value Ref Range Status   Enterococcus faecalis NOT DETECTED NOT DETECTED Final   Enterococcus Faecium NOT DETECTED NOT DETECTED Final   Listeria monocytogenes NOT DETECTED NOT DETECTED Final   Staphylococcus species DETECTED (A) NOT DETECTED Final    Comment: CRITICAL RESULT CALLED TO, READ BACK BY AND VERIFIED WITH: L POINTDEXTER PHARM D 06/20/2024 @0034  BY DD    Staphylococcus aureus (BCID) NOT DETECTED NOT DETECTED Final   Staphylococcus epidermidis DETECTED (A) NOT DETECTED Final    Comment: Methicillin (oxacillin) resistant coagulase negative staphylococcus. Possible blood culture contaminant (unless isolated from more than one blood culture draw or clinical case suggests pathogenicity). No antibiotic treatment is indicated for blood  culture contaminants. CRITICAL RESULT CALLED TO, READ BACK BY AND VERIFIED WITH: L POINTDEXTER PHARM D 06/20/2024 @0034  BY DD    Staphylococcus lugdunensis NOT DETECTED NOT DETECTED Final   Streptococcus species NOT DETECTED NOT DETECTED Final   Streptococcus agalactiae NOT DETECTED NOT DETECTED Final   Streptococcus pneumoniae NOT DETECTED NOT DETECTED Final   Streptococcus pyogenes NOT DETECTED NOT DETECTED Final    A.calcoaceticus-baumannii NOT DETECTED NOT DETECTED Final   Bacteroides fragilis NOT DETECTED NOT DETECTED Final   Enterobacterales NOT DETECTED NOT DETECTED Final   Enterobacter cloacae complex NOT DETECTED NOT DETECTED Final   Escherichia coli NOT DETECTED NOT DETECTED Final   Klebsiella aerogenes NOT DETECTED NOT DETECTED Final   Klebsiella oxytoca NOT DETECTED NOT DETECTED Final   Klebsiella pneumoniae NOT DETECTED NOT DETECTED Final   Proteus species NOT DETECTED NOT DETECTED Final   Salmonella species NOT DETECTED NOT DETECTED Final   Serratia marcescens NOT DETECTED NOT DETECTED Final   Haemophilus influenzae NOT DETECTED NOT DETECTED Final   Neisseria meningitidis NOT DETECTED NOT DETECTED Final   Pseudomonas aeruginosa NOT DETECTED NOT DETECTED Final   Stenotrophomonas maltophilia NOT DETECTED NOT DETECTED Final   Candida albicans NOT DETECTED NOT DETECTED Final   Candida auris NOT DETECTED NOT DETECTED Final   Candida glabrata NOT DETECTED NOT DETECTED Final   Candida krusei NOT DETECTED NOT DETECTED Final   Candida parapsilosis NOT DETECTED NOT DETECTED Final   Candida tropicalis NOT DETECTED NOT DETECTED Final   Cryptococcus neoformans/gattii NOT DETECTED NOT DETECTED Final   Methicillin resistance mecA/C DETECTED (A) NOT DETECTED Final    Comment: CRITICAL RESULT CALLED TO, READ BACK BY AND VERIFIED WITH: L POINTDEXTER PHARM D 06/20/2024 @0034  BY DD Performed at [facility], [address] [place], [place] [address]   Culture, blood (Routine X 2) w Reflex to ID Panel     Status: None (Preliminary result)   Collection Time: 06/20/24 11:07 AM   Specimen: BLOOD LEFT ARM  Result Value Ref Range Status   Specimen Description   Final    BLOOD LEFT ARM BOTTLES DRAWN AEROBIC ONLY Performed at [facility], [address], [place], [place] [address]    Special Requests   Final  Blood Culture adequate volume Performed at [facility], [address], [place], [place] [address]    Culture   Final    NO GROWTH 3 DAYS Performed at [facility], [address], [place], [place] [address]    Report Status PENDING  Incomplete  Culture, blood (Routine X 2) w Reflex to ID Panel     Status: None (Preliminary result)   Collection Time: 06/20/24 11:13 AM   Specimen: BLOOD LEFT ARM  Result Value Ref Range Status   Specimen Description   Final    BLOOD LEFT ARM BOTTLES DRAWN AEROBIC ONLY Performed at [facility], [address] [place], [place] [address]    Special Requests   Final    Blood Culture adequate volume Performed at [facility], [address], [place], [place] [address]    Culture   Final    NO GROWTH 3 DAYS Performed at [facility], [address], [place], [place] [address]    Report Status PENDING  Incomplete    Scheduled Meds:  empagliflozin   10 mg Oral Daily   hydrALAZINE   10 mg Oral BID   losartan   50 mg Oral Daily   mycophenolate   360 mg Oral BID   potassium chloride   20 mEq Oral Once   tacrolimus   0.5 mg Oral QPM   tacrolimus   1 mg Oral Q breakfast   Continuous Infusions:  cefTRIAXone  (ROCEPHIN )  IV Stopped (06/23/24 1109)   DAPTOmycin  Stopped (06/22/24 1549)    LOS: 5 days   Time spent: 52 minutes  Dr. Sherre Location: [place]  [facility] If 7PM-7AM, please contact night-coverage 06/23/2024, 4:23 PM   "

## 2024-06-23 NOTE — Progress Notes (Signed)
 Patient with elevated skin temp per current health for the majority of the night in the 99.0 range. Highest skin temp was 100.2. Several attempts were made to reach patient via phone and tablet to make sure patient was ok. No answer from patient or his wife. Currently skin temp is back within normal range at 97.9.

## 2024-06-23 NOTE — Progress Notes (Signed)
 Called pt via phone to update on paramedic visit. Pt wanted to take am meds. Assisted/supervised am meds via camera.

## 2024-06-24 ENCOUNTER — Other Ambulatory Visit (HOSPITAL_COMMUNITY): Payer: Self-pay

## 2024-06-24 MED ORDER — AMOXICILLIN-POT CLAVULANATE 875-125 MG PO TABS
1.0000 | ORAL_TABLET | Freq: Two times a day (BID) | ORAL | 0 refills | Status: AC
  Filled 2024-06-24: qty 8, 4d supply, fill #0

## 2024-06-24 MED ORDER — DOXYCYCLINE HYCLATE 100 MG PO TABS
100.0000 mg | ORAL_TABLET | Freq: Two times a day (BID) | ORAL | 0 refills | Status: AC
  Filled 2024-06-24: qty 8, 4d supply, fill #0

## 2024-06-24 MED ORDER — POTASSIUM CHLORIDE CRYS ER 10 MEQ PO TBCR
10.0000 meq | EXTENDED_RELEASE_TABLET | Freq: Two times a day (BID) | ORAL | 0 refills | Status: DC
  Filled 2024-06-24: qty 14, 7d supply, fill #0

## 2024-06-24 NOTE — Progress Notes (Signed)
 This EMT arrived at home of PT and was greeted by PT's wife at door. Pt was found sitting in chair in lower floor bedroom area. PT assistance with med administration provided with RN GRETCHIN verifying via video visit. PT assessment completed and vitals captured and documented. No complaints or concerns at this time. All scheduled assessment completed.

## 2024-06-24 NOTE — Discharge Summary (Signed)
 "  [facility] Physician Discharge Summary   Patient: [name] [name] MRN: [id number] DOB: [birth year]  Admit date:     06/18/2024  Discharge date: 06/24/2024  Discharge Physician: Burnard DELENA Cunning   PCP: Macie Benedetta POUR, MD    Patient identified themself as [name] [name]  DOB 1945/11/20  Medic Steffan Daring present in the home during encounter and performed the physical exam and assessment. Patient was seen today via video chat; my physical location [place], [place].   Recommendations at discharge:    Follow up with PCP in 1-2 weeks Repeat CBC, CMP, Mg at follow up Follow up on Unna boot use, home health RN arranged for Foot locker changes as below Follow up as scheduled with Dr. Harden for lymphedema management Follow repeat blood cultures to final - no growth x 4 days currently  Discharge Diagnoses: Principal Problem:   Cellulitis of left lower extremity Active Problems:   Renal transplant, status post   Acute renal failure superimposed on stage 3a chronic kidney disease (HCC)   Positive blood cultures   Immunosuppressed status   Liver transplant recipient (HCC)   Hypokalemia   PAF (paroxysmal atrial fibrillation) (HCC)   Morbid obesity (HCC)   Hyperlipidemia   Lymphedema   Essential hypertension  Resolved Problems:   * No resolved hospital problems. [facility] Course: [name] [name] is a 78 year old male with history of CKD stage IIIb,, venous stasis, atrial fibrillation not on anticoagulation, history of IgA nephropathy, history of native right renal cell carcinoma status post cryoablation, end-stage renal disease status post renal transplant, currently CKD stage IIIb, morbid obesity.    06/18/2024: Patient presents to the ED for chief concerns of left leg swelling missed for 2 weeks.  In the ED, patient received 1 dose of levofloxacin  750 mg IV at 23:14 hrs.  Vancomycin  1 g, 1 dose at 2312.  12/18: Patient admitted to hospitalist service for left lower extremity  cellulitis.  Blood cultures x 2 collected. Patient started on broad-spectrum antibiotics:   Early morning on 12/19: Meropenem  1 g one-time dose at 3:38 AM, vancomycin  1.5 g at 1:24 AM.  Unasyn  every 6 hours per pharmacy initiated at 1211.  Daptomycin  4 mg IV one-time dose at 2033.  12/20: Patient transferred to H@H  service. Blood cultures grew positive for Staph epidermidis in 2 sets.  Complete echo done which was read as estimate ejection fraction 60 to 65%, grade 1 diastolic dysfunction.  12/21: I assumed care of patient. Day 3 of abx. Ceftriaxone  2 g IV daily added per ID consult note. ID specialist, Dr. Dennise states they will keep pt on list and follow peripherally.  ID will reach out to therapy any changes.  IV, infiltrated. Medic will attempt IV and if needed, pt will come back to the ED.  12/22: day 4 of abx. Renal function improved on BMP yesterday. Home losartan  50 mg daily, Jardiance  10 mg daily were resumed. Potassium level noted to be 3.4 on labs 12/21, patient states he will eat two bananas.  12/23: day 4 of abx. BMP and serum magnesium labs today. Patient states he is doing well and that he is more used to our service now. Blood cx from 06/18/24, s/s has not resulted today. Potassium chloride  20 mEq PO ordered for evening. Though he may refused it.  Home Health for complex wound for Unna boot changes (2x/week) placed. Next time wound changes is due is Thursday.   12/24: I assumed care this morning.  Pt overall doing well.  Did decline potassium, stating he will not take it.  We discussed home health for Unna boot changes.  Boots were changed today at patient request to have legs examined prior to discharge.   Patient is medically stable and requesting discharge today.  Assessment and Plan:  * Cellulitis of left lower extremity 12/21: Day 3 of antibiotic Continue with daptomycin  as recommended by ID specialist Unna boot 2 times per week Next Boot change is 12/22, discussed  with medic that I will need to see patient skin and images in the chart before Unna boot placement Check CBC in the AM 12/23: day 5 of abx  12/24 - discharge on PO doxycycline  and Augmentin  x 4 more days to complete full course as recommended by ID.    Positive blood cultures With staph epidermidis from 2 sets Repeat blood cultures have been obtained on 12/20, at this time no growth to date Complete echo on 06/20/2024: Read as estimate ejection fraction 60 to 65%, grade 1 diastolic dysfunction Continue with ceftriaxone  as ordered by ID specialist Discussed case with ID specialist, Dr. Dennise who states they will keep patient on their list and follow peripherally.  ID will reach out should repeat blood cultures change.   12/24: repeat blood cx negative to date   Acute renal failure superimposed on stage 3a chronic kidney disease (HCC) Acute kidney injury on CKD stage IIIa Home losartan  and furosemide were held Give IV fluids and improved Recheck CMP at follow up   Hypokalemia 12/22: Potassium chloride  supplementation has been offered and patient declines He states he will eat 2 bananas 12/23: potassium chloride  20 mEq PO once - pt refused 12/24 - pt continues to decline to take K supplementation  --Repeat labs at follow up      Liver transplant recipient [facility]) Continue mycophenolate  and tacrolimus  per home dosing Continue outpatient follow-up with transplant specialist as appropriate   Immunosuppressed status Status post renal and liver transplant in 2014 Currently on mycophenolate  360 mg p.o. twice daily, tacrolimus  0.5 mg every evening and tacrolimus  1 mg daily with breakfast   Essential hypertension Home furosemide 20 mg daily and losartan  50 mg daily held on admission in setting of AKI Hydralazine  25 mg one-time dose in the evening of 12/21 Hydralazine  10 mg p.o. twice daily starting on 12/22 --Home regimen resumed on d/c since AKI recovered --PCP follow up --Repeat BMP  at follow up   Hyperlipidemia Patient currently not on anticholesterol medication   Morbid obesity (HCC) This complicates overall care and prognosis.    PAF (paroxysmal atrial fibrillation) (HCC) Not on anticoagulation, not on rate control or rhythm control medication       Consultants: ID Procedures performed: None  Disposition: Home health Diet recommendation:  Cardiac diet DISCHARGE MEDICATION: Allergies as of 06/24/2024       Reactions   Cephalosporins Other (See Comments)   Liver/Kidney failure (ID service says AKI d/t IgA nephropathy and ok to order cephalosporins 06/20/24   Zyvox [linezolid] Nausea Only, Other (See Comments)   Liver/Kidney failure.         Medication List     STOP taking these medications    doxycycline  100 MG capsule Commonly known as: VIBRAMYCIN        TAKE these medications    allopurinol 300 MG tablet Commonly known as: ZYLOPRIM Take 300 mg by mouth every evening.   furosemide 20 MG tablet Commonly known as: LASIX Take 20 mg by mouth 2 (two)  times daily.   Jardiance  10 MG Tabs tablet Generic drug: empagliflozin  Take 10 mg by mouth daily.   losartan  50 MG tablet Commonly known as: COZAAR  Take 50 mg by mouth daily.   mycophenolate  180 MG EC tablet Commonly known as: MYFORTIC  Take 360 mg by mouth 2 (two) times daily.   tacrolimus  1 MG capsule Commonly known as: PROGRAF  Take 1 mg by mouth daily with breakfast.   tacrolimus  0.5 MG capsule Commonly known as: PROGRAF  Take 0.5 mg by mouth every evening.       ASK your doctor about these medications    amoxicillin -clavulanate 875-125 MG tablet Commonly known as: AUGMENTIN  Take 1 tablet by mouth 2 (two) times daily for 4 days. Ask about: Should I take this medication?   doxycycline  100 MG tablet Commonly known as: VIBRA -TABS Take 1 tablet (100 mg total) by mouth 2 (two) times daily for 4 days. Ask about: Should I take this medication?   oxyCODONE  5 MG immediate  release tablet Commonly known as: Oxy IR/ROXICODONE  Take 1 tablet (5 mg total) by mouth every 6 (six) hours as needed for up to 2 days for moderate pain (pain score 4-6). Ask about: Should I take this medication?        Follow-up Information     Harden Jerona GAILS, MD Follow up in 1 week(s).   Specialty: Orthopedic Surgery Contact information: [address] [place] [address] [phone]         Macie Benedetta POUR, MD. Schedule an appointment as soon as possible for a visit.   Specialty: Nephrology Why: within 1 week for hospital follow up for cellulitis and repeat labs - follow potassium levels Contact information: [address] [facility] [address] [facility] Division of Nephrology Time [place] [address] [phone]         [facility]) Follow up.   Specialty: Home Health Services Why: Home Health Contact information: [address] [place]  [address] [phone]               Discharge Exam: [name] [name]   06/19/24 0009  Weight: 135.1 kg   General exam: awake, alert, no acute distress, obese HEENT: voice clear, hearing grossly normal  Respiratory system: on room air, normal respiratory effort. Cardiovascular system: normal S1/S2, RRR,   Gastrointestinal system: protuberant abdomen Central nervous system: A&O x 4. no gross focal neurologic deficits, normal speech Extremities: images of lower extremities below Skin: images of lower extremities below Psychiatry: normal mood, congruent affect, judgement and insight appear normal           Condition at discharge: stable  The results of significant diagnostics from this hospitalization (including imaging, microbiology, ancillary and laboratory) are listed below for reference.   Imaging Studies: ECHOCARDIOGRAM COMPLETE Result Date: 06/20/2024    ECHOCARDIOGRAM REPORT   Patient Name:   [name] [name] Date of Exam: 06/20/2024  Medical Rec #:  [id number]     Height:       72.0 in Accession #:    [id number]    Weight:       297.8 lb Date of Birth:  1946/01/22     BSA:          2.523 m Patient Age:    78 years      BP:           154/73 mmHg Patient Gender: M  HR:           89 bpm. Exam Location:  Inpatient Procedure: 2D Echo, Cardiac Doppler, Color Doppler and Intracardiac            Opacification Agent (Both Spectral and Color Flow Doppler were            utilized during procedure). Indications:    Bacteremia R78.81  History:        Patient has no prior history of Echocardiogram examinations.                 Signs/Symptoms:Hypertensive Heart Disease.  Sonographer:    Nathanel Devonshire Referring Phys: [phone] STEVEN J NEWTON IMPRESSIONS  1. Left ventricular ejection fraction, by estimation, is 60 to 65%. Left ventricular ejection fraction by 2D MOD biplane is 61.1 %. The left ventricle has normal function. The left ventricle has no regional wall motion abnormalities. There is mild left ventricular hypertrophy. Left ventricular diastolic parameters are consistent with Grade I diastolic dysfunction (impaired relaxation).  2. Right ventricular systolic function is normal. The right ventricular size is normal.  3. The mitral valve is normal in structure. No evidence of mitral valve regurgitation. No evidence of mitral stenosis.  4. The aortic valve was not well visualized. Aortic valve regurgitation is mild. No aortic stenosis is present. FINDINGS  Left Ventricle: Left ventricular ejection fraction, by estimation, is 60 to 65%. Left ventricular ejection fraction by 2D MOD biplane is 61.1 %. The left ventricle has normal function. The left ventricle has no regional wall motion abnormalities. The left ventricular internal cavity size was normal in size. There is mild left ventricular hypertrophy. Left ventricular diastolic parameters are consistent with Grade I diastolic dysfunction (impaired relaxation). Right Ventricle: The right ventricular  size is normal. No increase in right ventricular wall thickness. Right ventricular systolic function is normal. Left Atrium: Left atrial size was normal in size. Right Atrium: Right atrial size was normal in size. Pericardium: There is no evidence of pericardial effusion. Mitral Valve: The mitral valve is normal in structure. No evidence of mitral valve regurgitation. No evidence of mitral valve stenosis. Tricuspid Valve: The tricuspid valve is normal in structure. Tricuspid valve regurgitation is not demonstrated. No evidence of tricuspid stenosis. Aortic Valve: The aortic valve was not well visualized. Aortic valve regurgitation is mild. Aortic regurgitation PHT measures 445 msec. No aortic stenosis is present. Aortic valve mean gradient measures 7.0 mmHg. Aortic valve peak gradient measures 12.7 mmHg. Aortic valve area, by VTI measures 3.08 cm. Pulmonic Valve: The pulmonic valve was not well visualized. Pulmonic valve regurgitation is not visualized. No evidence of pulmonic stenosis. Aorta: The aortic root is normal in size and structure. Venous: The inferior vena cava was not well visualized. IAS/Shunts: The interatrial septum was not well visualized.  LEFT VENTRICLE PLAX 2D                        Biplane EF (MOD) LVIDd:         5.60 cm         LV Biplane EF:   Left LVIDs:         4.00 cm                          ventricular LV PW:         1.30 cm  ejection LV IVS:        1.30 cm                          fraction by LVOT diam:     2.20 cm                          2D MOD LV SV:         92                               biplane is LV SV Index:   36                               61.1 %. LVOT Area:     3.80 cm LV IVRT:       102 msec        Diastology                                LV e' medial:    5.33 cm/s                                LV E/e' medial:  10.7 LV Volumes (MOD)               LV e' lateral:   3.37 cm/s LV vol d, MOD    78.9 ml       LV E/e' lateral: 16.9 A2C: LV vol d, MOD     138.0 ml A4C: LV vol s, MOD    29.4 ml A2C: LV vol s, MOD    53.3 ml A4C: LV SV MOD A2C:   49.5 ml LV SV MOD A4C:   138.0 ml LV SV MOD BP:    66.1 ml RIGHT VENTRICLE RV Basal diam:  3.10 cm     PULMONARY VEINS RV S prime:     23.90 cm/s  Diastolic Velocity: 45.00 cm/s TAPSE (M-mode): 2.3 cm      S/D Velocity:       1.70                             Systolic Velocity:  76.70 cm/s LEFT ATRIUM             Index        RIGHT ATRIUM           Index LA diam:        3.60 cm 1.43 cm/m   RA Area:     12.40 cm LA Vol (A2C):   60.4 ml 23.94 ml/m  RA Volume:   28.60 ml  11.33 ml/m LA Vol (A4C):   43.9 ml 17.40 ml/m LA Biplane Vol: 53.1 ml 21.04 ml/m  AORTIC VALVE AV Area (Vmax):    2.63 cm AV Area (Vmean):   2.62 cm AV Area (VTI):     3.08 cm AV Vmax:           178.00 cm/s AV Vmean:          120.000 cm/s AV VTI:            0.297 m AV Peak Grad:  12.7 mmHg AV Mean Grad:      7.0 mmHg LVOT Vmax:         123.00 cm/s LVOT Vmean:        82.600 cm/s LVOT VTI:          0.241 m LVOT/AV VTI ratio: 0.81 AI PHT:            445 msec  AORTA Ao Root diam: 3.80 cm Ao Asc diam:  3.80 cm MITRAL VALVE MV Area (PHT): 4.89 cm    SHUNTS MV E velocity: 57.00 cm/s  Systemic VTI:  0.24 m MV A velocity: 88.70 cm/s  Systemic Diam: 2.20 cm MV E/A ratio:  0.64 Franck Azobou Tonleu Electronically signed by Joelle Cedars Tonleu Signature Date/Time: 06/20/2024/3:29:27 PM    Final    US  RENAL Result Date: 06/19/2024 EXAM: US  Retroperitoneum Complete, Renal. 06/19/2024 06:47:12 PM TECHNIQUE: Real-time ultrasonography of the retroperitoneum renal was performed. COMPARISON: Prior study dated 01/15/2012. CLINICAL HISTORY: AKI (acute kidney injury). Prior renal transplant. FINDINGS: LEFT KIDNEY/URETER: Native kidneys are not well visualized due to body habitus and overlying bowel gas. RIGHT KIDNEY/URETER: Native kidneys are not well visualized due to body habitus and overlying bowel gas. RIGHT LOWER QUADRANT TRANSPLANT KIDNEY: Right lower quadrant  transplant kidney measures 10.8 x 6.6 x 7.8 cm. Normal parenchymal echotexture and thickness. No hydronephrosis or hydroureter. Normal parenchymal flow is demonstrated on color flow Doppler images. A complete renal transplant vascular Doppler study is not obtained. No calculus. No mass. BLADDER: The visualized bladder is unremarkable. IMPRESSION: 1. Right lower quadrant transplant kidney without hydronephrosis or hydroureter, with normal parenchymal echotexture and thickness and normal parenchymal flow on color Doppler images. 2. No acute findings. 3. Native kidneys are not well visualized. Electronically signed by: Elsie Gravely MD 06/19/2024 10:30 PM EST RP Workstation: [id number]   CT TIBIA FIBULA LEFT WO CONTRAST Result Date: 06/19/2024 EXAM: CT LEFT LOWER EXTREMITY, WITHOUT IV CONTRAST 06/19/2024 10:21:00 PM TECHNIQUE: Axial images were acquired through the left lower extremity without IV contrast. Reformatted images were reviewed. Automated exposure control, iterative reconstruction, and/or weight based adjustment of the mA/kV was utilized to reduce the radiation dose to as low as reasonably achievable. COMPARISON: None available. CLINICAL HISTORY: Soft tissue infection suspected, lower leg, xray done. FINDINGS: BONES AND JOINTS: Mild degenerative changes in the knee and ankle joints. Bones appear intact. No acute fracture or focal osseous lesion. No dislocation. No focal bone sclerosis or bone erosion to suggest evidence of osteomyelitis. The joint spaces are normal. SOFT TISSUES: Diffuse soft tissue edema most prominent around the lateral and posterior aspects of the lower leg and becoming circumferential into the lower leg and ankle region. Edema extends into the dorsum of the foot. No loculated collections are identified. No radiopaque foreign bodies or gas demonstrated in the soft tissues. IMPRESSION: 1. Diffuse soft tissue edema extending from the lower leg into the ankle and dorsum of the foot,  without loculated collection, soft tissue gas, or radiopaque foreign body. 2. No acute osseous abnormality or CT evidence of osteomyelitis. Electronically signed by: Elsie Gravely MD 06/19/2024 10:27 PM EST RP Workstation: [id number]   VAS US  LOWER EXTREMITY VENOUS (DVT) Result Date: 06/19/2024  Lower Venous DVT Study Patient Name:  [name] [name]  Date of Exam:   06/19/2024 Medical Rec #: [id number]      Accession #:    [id number] Date of Birth: 1946/03/23      Patient Gender: M Patient Age:   51 years Exam Location:  [facility] Procedure:      VAS US  LOWER EXTREMITY VENOUS (DVT) Referring Phys: TIMOTHY OPYD --------------------------------------------------------------------------------  Indications: Swelling, Pain, Erythema, and Cellulitis, venous stasis.  Limitations: Body habitus, poor ultrasound/tissue interface and Pain with compression maneuvers, subcutaneous edema. Comparison Study: No prior study on file Performing Technologist: Alberta Lis RVS  Examination Guidelines: A complete evaluation includes B-mode imaging, spectral Doppler, color Doppler, and power Doppler as needed of all accessible portions of each vessel. Bilateral testing is considered an integral part of a complete examination. Limited examinations for reoccurring indications may be performed as noted. The reflux portion of the exam is performed with the patient in reverse Trendelenburg.  +-----+---------------+---------+-----------+----------+--------------+ RIGHTCompressibilityPhasicitySpontaneityPropertiesThrombus Aging +-----+---------------+---------+-----------+----------+--------------+ CFV  Full           Yes      Yes                                 +-----+---------------+---------+-----------+----------+--------------+ SFJ  Full                                                        +-----+---------------+---------+-----------+----------+--------------+    +---------+---------------+---------+-----------+----------+-------------------+ LEFT     CompressibilityPhasicitySpontaneityPropertiesThrombus Aging      +---------+---------------+---------+-----------+----------+-------------------+ CFV      Full           Yes      Yes                                      +---------+---------------+---------+-----------+----------+-------------------+ SFJ      Full                                                             +---------+---------------+---------+-----------+----------+-------------------+ FV Prox  Full           Yes      Yes                                      +---------+---------------+---------+-----------+----------+-------------------+ FV Mid   Full                                                             +---------+---------------+---------+-----------+----------+-------------------+ FV DistalFull                                                             +---------+---------------+---------+-----------+----------+-------------------+ PFV      Full           Yes      No                                       +---------+---------------+---------+-----------+----------+-------------------+  POP      Full           Yes      Yes                                      +---------+---------------+---------+-----------+----------+-------------------+ PTV                                                   Not well visualized +---------+---------------+---------+-----------+----------+-------------------+ PERO                                                  Not well visualized +---------+---------------+---------+-----------+----------+-------------------+ Gastroc  Full                                                             +---------+---------------+---------+-----------+----------+-------------------+     Summary: RIGHT: - No evidence of common femoral vein obstruction.   LEFT: - There  is no evidence of deep vein thrombosis in the lower extremity. However, portions of this examination were limited- see technologist comments above.  - No cystic structure found in the popliteal fossa. - Ultrasound characteristics of enlarged lymph nodes noted in the groin.  *See table(s) above for measurements and observations. Electronically signed by Debby Robertson on 06/19/2024 at 8:23:44 PM.    Final    DG Chest 1 View Result Date: 06/18/2024 EXAM: 1 VIEW(S) XRAY OF THE CHEST 06/18/2024 11:39:00 PM COMPARISON: None available. CLINICAL HISTORY: fluid overload FINDINGS: LUNGS AND PLEURA: Lung volumes are small. No focal pulmonary opacity. No pleural effusion. No pneumothorax. HEART AND MEDIASTINUM: Mild cardiomegaly. Mediastinal widening likely related to semi-erect position and poor pulmonary insufflation. BONES AND SOFT TISSUES: No acute osseous abnormality. IMPRESSION: 1. Mild cardiomegaly. 2. Pulmonary hypoinflation Electronically signed by: Dorethia Molt MD 06/18/2024 11:42 PM EST RP Workstation: [id number]    Microbiology: Results for orders placed or performed during the hospital encounter of 06/18/24  Blood culture (routine x 2)     Status: Abnormal   Collection Time: 06/18/24 10:51 PM   Specimen: BLOOD RIGHT HAND  Result Value Ref Range Status   Specimen Description   Final    BLOOD RIGHT HAND Performed at [facility], [address] [place], [place] [address]    Special Requests   Final    BOTTLES DRAWN AEROBIC AND ANAEROBIC Blood Culture adequate volume Performed at [facility], [address] [place], [place] [address]    Culture  Setup Time   Final    GRAM POSITIVE COCCI IN BOTH AEROBIC AND ANAEROBIC BOTTLES CRITICAL RESULT CALLED TO, READ BACK BY AND VERIFIED WITH: L POINTDEXTER PHARM D 06/20/2024 @0034  BY DD Performed at [facility], [address], [place], [place] [address]    Culture STAPHYLOCOCCUS EPIDERMIDIS (A)  Final    Report Status 06/22/2024 FINAL  Final   Organism ID, Bacteria STAPHYLOCOCCUS EPIDERMIDIS  Final      Susceptibility  Staphylococcus epidermidis - MIC*    CIPROFLOXACIN 4 RESISTANT Resistant     ERYTHROMYCIN >=8 RESISTANT Resistant     GENTAMICIN <=0.5 SENSITIVE Sensitive     OXACILLIN >=4 RESISTANT Resistant     TETRACYCLINE >=16 RESISTANT Resistant     VANCOMYCIN  1 SENSITIVE Sensitive     TRIMETH/SULFA <=10 SENSITIVE Sensitive     CLINDAMYCIN >=8 RESISTANT Resistant     RIFAMPIN <=0.5 SENSITIVE Sensitive     Inducible Clindamycin NEGATIVE Sensitive     * STAPHYLOCOCCUS EPIDERMIDIS  Blood culture (routine x 2)     Status: Abnormal   Collection Time: 06/18/24 10:51 PM   Specimen: BLOOD LEFT ARM  Result Value Ref Range Status   Specimen Description BLOOD LEFT ARM  Final   Special Requests   Final    BOTTLES DRAWN AEROBIC AND ANAEROBIC Blood Culture adequate volume   Culture  Setup Time   Final    GRAM POSITIVE COCCI IN BOTH AEROBIC AND ANAEROBIC BOTTLES CRITICAL VALUE NOTED.  VALUE IS CONSISTENT WITH PREVIOUSLY REPORTED AND CALLED VALUE.    Culture STAPHYLOCOCCUS EPIDERMIDIS (A)  Final   Report Status 06/22/2024 FINAL  Final   Organism ID, Bacteria STAPHYLOCOCCUS EPIDERMIDIS  Final      Susceptibility   Staphylococcus epidermidis - MIC*    CIPROFLOXACIN <=0.5 SENSITIVE Sensitive     ERYTHROMYCIN >=8 RESISTANT Resistant     GENTAMICIN <=0.5 SENSITIVE Sensitive     OXACILLIN >=4 RESISTANT Resistant     TETRACYCLINE <=1 SENSITIVE Sensitive     VANCOMYCIN  1 SENSITIVE Sensitive     TRIMETH/SULFA <=10 SENSITIVE Sensitive     CLINDAMYCIN <=0.25 SENSITIVE Sensitive     RIFAMPIN <=0.5 SENSITIVE Sensitive     Inducible Clindamycin NEGATIVE Sensitive     * STAPHYLOCOCCUS EPIDERMIDIS  Blood Culture ID Panel (Reflexed)     Status: Abnormal   Collection Time: 06/18/24 10:51 PM  Result Value Ref Range Status   Enterococcus faecalis NOT DETECTED NOT DETECTED Final   Enterococcus  Faecium NOT DETECTED NOT DETECTED Final   Listeria monocytogenes NOT DETECTED NOT DETECTED Final   Staphylococcus species DETECTED (A) NOT DETECTED Final    Comment: CRITICAL RESULT CALLED TO, READ BACK BY AND VERIFIED WITH: L POINTDEXTER PHARM D 06/20/2024 @0034  BY DD    Staphylococcus aureus (BCID) NOT DETECTED NOT DETECTED Final   Staphylococcus epidermidis DETECTED (A) NOT DETECTED Final    Comment: Methicillin (oxacillin) resistant coagulase negative staphylococcus. Possible blood culture contaminant (unless isolated from more than one blood culture draw or clinical case suggests pathogenicity). No antibiotic treatment is indicated for blood  culture contaminants. CRITICAL RESULT CALLED TO, READ BACK BY AND VERIFIED WITH: L POINTDEXTER PHARM D 06/20/2024 @0034  BY DD    Staphylococcus lugdunensis NOT DETECTED NOT DETECTED Final   Streptococcus species NOT DETECTED NOT DETECTED Final   Streptococcus agalactiae NOT DETECTED NOT DETECTED Final   Streptococcus pneumoniae NOT DETECTED NOT DETECTED Final   Streptococcus pyogenes NOT DETECTED NOT DETECTED Final   A.calcoaceticus-baumannii NOT DETECTED NOT DETECTED Final   Bacteroides fragilis NOT DETECTED NOT DETECTED Final   Enterobacterales NOT DETECTED NOT DETECTED Final   Enterobacter cloacae complex NOT DETECTED NOT DETECTED Final   Escherichia coli NOT DETECTED NOT DETECTED Final   Klebsiella aerogenes NOT DETECTED NOT DETECTED Final   Klebsiella oxytoca NOT DETECTED NOT DETECTED Final   Klebsiella pneumoniae NOT DETECTED NOT DETECTED Final   Proteus species NOT DETECTED NOT DETECTED Final   Salmonella species NOT DETECTED NOT  DETECTED Final   Serratia marcescens NOT DETECTED NOT DETECTED Final   Haemophilus influenzae NOT DETECTED NOT DETECTED Final   Neisseria meningitidis NOT DETECTED NOT DETECTED Final   Pseudomonas aeruginosa NOT DETECTED NOT DETECTED Final   Stenotrophomonas maltophilia NOT DETECTED NOT DETECTED Final    Candida albicans NOT DETECTED NOT DETECTED Final   Candida auris NOT DETECTED NOT DETECTED Final   Candida glabrata NOT DETECTED NOT DETECTED Final   Candida krusei NOT DETECTED NOT DETECTED Final   Candida parapsilosis NOT DETECTED NOT DETECTED Final   Candida tropicalis NOT DETECTED NOT DETECTED Final   Cryptococcus neoformans/gattii NOT DETECTED NOT DETECTED Final   Methicillin resistance mecA/C DETECTED (A) NOT DETECTED Final    Comment: CRITICAL RESULT CALLED TO, READ BACK BY AND VERIFIED WITH: L POINTDEXTER PHARM D 06/20/2024 @0034  BY DD Performed at [facility], [address] [place], [place] [address]   Culture, blood (Routine X 2) w Reflex to ID Panel     Status: None   Collection Time: 06/20/24 11:07 AM   Specimen: BLOOD LEFT ARM  Result Value Ref Range Status   Specimen Description   Final    BLOOD LEFT ARM BOTTLES DRAWN AEROBIC ONLY Performed at [facility], [address] [place], [place] [address]    Special Requests   Final    Blood Culture adequate volume Performed at [facility], [address] [place], [place] [address]    Culture   Final    NO GROWTH 5 DAYS Performed at [facility], [address] [place], [place] [address]    Report Status 06/25/2024 FINAL  Final  Culture, blood (Routine X 2) w Reflex to ID Panel     Status: None   Collection Time: 06/20/24 11:13 AM   Specimen: BLOOD LEFT ARM  Result Value Ref Range Status   Specimen Description   Final    BLOOD LEFT ARM BOTTLES DRAWN AEROBIC ONLY Performed at [facility], [address], [place], [place] [address]    Special Requests   Final    Blood Culture adequate volume Performed at [facility], [address], [place], [place] [address]    Culture   Final    NO GROWTH 5 DAYS Performed at [facility], [address], [place], [place] [address]    Report Status 06/25/2024 FINAL  Final     Labs: CBC: No results for input(s): WBC, NEUTROABS, HGB, HCT, MCV, PLT in the last 168 hours.  Basic Metabolic Panel: No results for input(s): NA, K, CL, CO2, GLUCOSE, BUN, CREATININE, CALCIUM, MG, PHOS in the last 168 hours.  Liver Function Tests: No results for input(s): AST, ALT, ALKPHOS, BILITOT, PROT, ALBUMIN  in the last 168 hours.  CBG: No results for input(s): GLUCAP in the last 168 hours.  Discharge time spent: greater than 30 minutes.  Signed: Burnard DELENA Cunning, DO [facility] 07/09/2024 "

## 2024-06-24 NOTE — Progress Notes (Signed)
 Arrived to find the PT sitting in his chair. No obvious distress or trauma noted. PT is Aox4. Wife is present. PT denied having any pain and stated that he was feeling fine.   Laceration was noted to right anterior shin. No active bleeding. Picture was uploaded to the chart. Bilateral unna boots were placed without incident.   Discharge instructions were given to the PT and the PT's wife and were discussed. PT and wife understood the instructions and medications that were prescribed. TOC medications were picked up by EMT Jackquline and Paramedic Patsye Sullivant brought them to the PT's house. No other questions were asked. Equipment was removed from the PT's house.

## 2024-06-24 NOTE — Progress Notes (Signed)
 Called patient identification completed patient self-administered: tacrlimus, jardiance , Myfortic ,and losartan . Refused hydralazine . Educated as to why the patient had bilateral unna boot that his right leg has a lateral supramaleolus wound.

## 2024-06-24 NOTE — Plan of Care (Signed)
" °  Problem: Nutrition: Goal: Adequate nutrition will be maintained Outcome: Adequate for Discharge   Problem: Coping: Goal: Level of anxiety will decrease Outcome: Adequate for Discharge   Problem: Elimination: Goal: Will not experience complications related to bowel motility Outcome: Adequate for Discharge Goal: Will not experience complications related to urinary retention Outcome: Adequate for Discharge   Problem: Safety: Goal: Ability to remain free from injury will improve Outcome: Adequate for Discharge   Problem: Clinical Measurements: Goal: Ability to avoid or minimize complications of infection will improve Outcome: Adequate for Discharge   "

## 2024-06-24 NOTE — Progress Notes (Signed)
 Video visit with [name] [name] and [name] [name], [place]. He is sitting in the chair. He has no c/o pain. His legs are wrapped. His left leg is visibly reddened and swollen. He reports he has chronic lymphedema and it is common for his left leg to be more swollen than the right. Advised to elevate leg when possible. He reports he sleeps in a bed, not a chair and will put it up tonight. Answered all questions. We discussed his medication regimen and reviewed how to reach me overnight.

## 2024-06-24 NOTE — Progress Notes (Signed)
 This medic arrived on scene to find patient sitting in his recliner. Patient alert and oriented x4.   Patient stated he felt well.   Iv site assessed and rocephin  infused without incident. IV flushed and curos capped.    Assessed. Bilateral pitting edema in feet. Edema more severe in left leg. Removed unna boots per dr. and patient. Laceration noted on right leg upon wrap removal and added to chart. Bleeding controlled. Non-adhesive bandage placed on wound and secured. Pt reported tender skin, especially on left leg wrap removal.  Plan discussed and wraps left off until afternoon discharge visit.

## 2024-06-24 NOTE — Progress Notes (Signed)
 AM rounds with patient, scientist, physiological, provider, and Tax Inspector. Patient states he feels well.  Patient D/C  meds being sent to [facility] pharmacy antibiotics closes early staff alerted.  Patient educated r/t dentification of infective organism of his cellulitis, also the rationale for bilateral unna boots. Medic removed dressings for provider to inspect.  Patient slated for D/C this afternoon.

## 2024-06-24 NOTE — Progress Notes (Signed)
 AM Call to patient. Discussed HH for bilateral unna boot are and the team has lab orders.  Patient state I'm  feeling perfectly fine when asked how he is doing and does he have any pain.

## 2024-06-24 NOTE — Consult Note (Addendum)
 WOC Nurse wound follow up Consult requested to clairify [place] boot orders.  Performed remotely after review of progress notes.  Pt had a WOC consult performed on 12/20 at 0857 for venous stasis ulcers and partial thickness wounds and topical treatment orders were provided at that time.   Later, a consult was performed by Dr Harden of the ortho service on 12/20 at 10:10. He has ordered the following, I will place an order for an Unna compression wraps for both lower extremities. Anticipate patient will discharge to hospital at home for antibiotics. I will follow-up in the office.  Cancelled previous WOC topical treatment orders to avoid confusion.  Pt should have Una boots applied to bilat legs as previously requested and they will need to be changed weekly after discharge by health at home staff. Pt will need to follow-up with Dr Harden after discharge.   Please re-consult if further assistance is needed.  Thank-you,  Stephane Fought MSN, RN, CWOCN, CWCN-AP, CNS Contact Mon-Fri 0700-1500: [phone]

## 2024-06-24 NOTE — TOC Initial Note (Signed)
 Transition of Care [facility]) - Initial/Assessment Note    Patient Details  Name: [name] [name] MRN: [id number] Date of Birth: [birth year]  Transition of Care [facility]) CM/SW Contact:    Debarah Saunas, RN Phone Number: 06/24/2024, 9:08 AM  Clinical Narrative:                 78 yo male presents to ED for leg swelling over 2 weeks; admitted for Left Lower Cellulitis  Expected Discharge Plan: Home w Home Health Services Barriers to Discharge: Continued Medical Work up   Patient Goals and CMS Choice Patient states their goals for this hospitalization and ongoing recovery are:: remain home but feeling better CMS Medicare.gov Compare Post Acute Care list provided to:: Patient Choice offered to / list presented to : Patient      Expected Discharge Plan and Services In-house Referral: NA Discharge Planning Services: CM Consult   Living arrangements for the past 2 months: Single Family Home                      RNCM called pt to inform him of recommendation for have Home Health Services but pt declines at this time. I have discussed the benefits of home health services as wll as the risks of not having home health services with pt. RNCM informed patient that if he changed his mind, to notify H@H  team when they visit today so that it can be arranged.                 Prior Living Arrangements/Services Living arrangements for the past 2 months: Single Family Home Lives with:: Spouse Patient language and need for interpreter reviewed:: Yes Do you feel safe going back to the place where you live?: Yes      Need for Family Participation in Patient Care: Yes (Comment) Care giver support system in place?: Yes (comment)   Criminal Activity/Legal Involvement Pertinent to Current Situation/Hospitalization: No - Comment as needed  Activities of Daily Living   ADL Screening (condition at time of admission) Independently performs ADLs?: Yes (appropriate for developmental age) Is the patient  deaf or have difficulty hearing?: No Does the patient have difficulty seeing, even when wearing glasses/contacts?: No Does the patient have difficulty concentrating, remembering, or making decisions?: No  Permission Sought/Granted Permission sought to share information with : Family Supports          Permission granted to share info w Relationship: spouse     Emotional Assessment       Orientation: : Oriented to Self, Oriented to Place, Oriented to  Time, Oriented to Situation Alcohol / Substance Use: Not Applicable Psych Involvement: No (comment)  Admission diagnosis:  Cellulitis of left lower extremity [L03.116] Left leg cellulitis [L03.116] Patient Active Problem List   Diagnosis Date Noted   Hypokalemia 06/22/2024   Positive blood cultures 06/21/2024   Essential hypertension 06/21/2024   Lymphedema 06/20/2024   Acute renal failure superimposed on stage 3a chronic kidney disease (HCC) 06/19/2024   Liver transplant recipient [facility])    Cellulitis of left lower extremity 06/18/2024   Renal transplant, status post 05/02/2019   Immunosuppressed status 05/02/2019   Hypertensive retinopathy of both eyes 02/25/2019   Posterior vitreous detachment of left eye 02/25/2019   Hx of LASIK 02/25/2019   End stage liver disease (HCC) 11/22/2016   Hyperlipidemia 11/22/2016   PAF (paroxysmal atrial fibrillation) (HCC) 12/01/2015   Morbid obesity (HCC) 12/01/2015   Chronic gout, unspecified, without tophus (tophi) 06/28/2015  End-stage renal disease (HCC) 10/16/2013   Nephrolithiasis 10/21/2012   Benign neoplasm of colon 06/28/2011   PCP:  Macie Benedetta POUR, MD Pharmacy:   [facility] - [place], [place] - [address] [place] [address] Phone: [phone] Fax: [phone]  [id number] - [place], [place] - [address] [place] [address] Phone: [phone] Fax: [phone]     Social Drivers of Health  (SDOH) Social History: SDOH Screenings   Food Insecurity: No Food Insecurity (06/19/2024)  Housing: Low Risk (06/19/2024)  Transportation Needs: No Transportation Needs (06/19/2024)  Utilities: Not At Risk (06/19/2024)  Social Connections: Socially Integrated (06/19/2024)  Tobacco Use: Low Risk (06/19/2024)   SDOH Interventions:     Readmission Risk Interventions    06/24/2024    9:07 AM  Readmission Risk Prevention Plan  Post Dischage Appt Complete  Medication Screening Complete  Transportation Screening Complete

## 2024-06-25 LAB — CULTURE, BLOOD (ROUTINE X 2)
Culture: NO GROWTH
Culture: NO GROWTH
Special Requests: ADEQUATE
Special Requests: ADEQUATE

## 2024-06-30 DIAGNOSIS — L821 Other seborrheic keratosis: Principal | ICD-10-CM

## 2024-06-30 DIAGNOSIS — Z86006 Personal history of melanoma in-situ: Principal | ICD-10-CM

## 2024-06-30 DIAGNOSIS — D229 Melanocytic nevi, unspecified: Principal | ICD-10-CM

## 2024-06-30 DIAGNOSIS — I872 Venous insufficiency (chronic) (peripheral): Principal | ICD-10-CM

## 2024-06-30 DIAGNOSIS — Z944 Liver transplant status: Principal | ICD-10-CM

## 2024-06-30 DIAGNOSIS — L814 Other melanin hyperpigmentation: Principal | ICD-10-CM

## 2024-06-30 DIAGNOSIS — D1801 Hemangioma of skin and subcutaneous tissue: Principal | ICD-10-CM

## 2024-06-30 DIAGNOSIS — Z85828 Personal history of other malignant neoplasm of skin: Principal | ICD-10-CM

## 2024-07-01 ENCOUNTER — Telehealth: Payer: Self-pay | Admitting: *Deleted

## 2024-07-01 NOTE — Telephone Encounter (Signed)
 RNCM notified by [facility] that pt reconsidered and would like home health services.  RNCM referred pt to [facility]; spoke with Artayvia, who accepted and confirmed availability.  Mayrani Khamis J. Debarah, BSN, RN, [facility]  Inpatient Care Management  Nurse Case Manager  [facility] Emergency Departments  Operative Services  [phone]

## 2024-07-06 DIAGNOSIS — Z944 Liver transplant status: Principal | ICD-10-CM

## 2024-07-06 DIAGNOSIS — Z79899 Other long term (current) drug therapy: Principal | ICD-10-CM

## 2024-07-09 ENCOUNTER — Ambulatory Visit: Admitting: Orthopedic Surgery

## 2024-07-09 DIAGNOSIS — I890 Lymphedema, not elsewhere classified: Secondary | ICD-10-CM | POA: Diagnosis not present

## 2024-07-13 ENCOUNTER — Encounter: Payer: Self-pay | Admitting: Orthopedic Surgery

## 2024-07-13 NOTE — Progress Notes (Signed)
 "  Office Visit Note   Patient: [name] [name]           Date of Birth: 15-Aug-1945           MRN: [id number] Visit Date: 07/09/2024              Requested by: Macie Benedetta POUR, MD [address] [facility] [address] Division of Nephrology [place],  [place] [address] PCP: Macie Benedetta POUR, MD  Chief Complaint  Patient presents with   Left Leg - Pain   Right Leg - Pain      HPI: Discussed the use of AI scribe software for clinical note transcription with the patient, who gave verbal consent to proceed.  History of Present Illness [name] [name] is a 79 year old male with chronic bilateral lower extremity lymphedema who presents for follow-up of persistent lower extremity swelling and lymphatic insufficiency.  He has longstanding, persistent swelling of both lower extremities, with greater severity in the left leg. The edema has been refractory to multiple conservative measures over several years, including various compression socks (including zippered varieties) and intermittent leg wrapping, though his legs have not been wrapped recently. He is able to don compression socks and has some that fit. He elevates his legs when sitting or lying down. Despite these interventions, there has been no significant improvement in swelling.  He describes dry, peeling skin on the left leg, which he attributes to chronic swelling. He intentionally does not remove the desquamated skin, as previously advised. He applies Cetaphil lotion with B vitamins for skin care. He has not used antibiotics. He was last evaluated in the hospital on June 20, 2024, for the same issue.  He is currently taking furosemide and anti-rejection medication following liver transplant. He has been advised to increase protein and B vitamin intake and uses over-the-counter protein supplements.     Assessment & Plan: Visit Diagnoses: No diagnosis found.  Plan: Assessment and Plan Assessment & Plan Chronic  lymphedema of both lower extremities Chronic, refractory lymphedema in both lower extremities, more severe on the left, with persistent swelling despite compression therapy. Papillomatous and brawny skin changes with xerosis and desquamation due to chronic edema. No arterial insufficiency. Lymphatic insufficiency worsened by venous disease. - Continue daily use of zippered compression stockings. - Elevate lower extremities when sitting or lying down. - Encourage regular exercise as tolerated. - Initiate lymphedema pumps for one hour daily use. - Recommend protein supplementation and B vitamins. - Apply emollient to affected skin as needed for comfort. - Removal of desquamated skin is a personal preference, not medically indicated. - Follow-up in four weeks to reassess limb measurements and response to interventions.      Follow-Up Instructions: No follow-ups on file.   Ortho Exam  Patient is alert, oriented, no adenopathy, well-dressed, normal affect, normal respiratory effort. Physical Exam CARDIOVASCULAR: Palpable dorsalis pedis pulse bilaterally, no arterial insufficiency. EXTREMITIES: Swelling worse in left leg, papillomatous changes, brawny skin color changes of entire calf. Dry peeling skin on left leg due to increased swelling. Left foot circumference 32 cm, ankle 32 cm, calf 49 cm. Right foot circumference 33 cm, ankle 34 cm, calf 44 cm.      Imaging: No results found. No images are attached to the encounter.  Labs: Lab Results  Component Value Date   HGBA1C 5.8 (H) 12/01/2015   REPTSTATUS 06/25/2024 FINAL 06/20/2024   GRAMSTAIN NO WBC SEEN NO ORGANISMS SEEN 10/17/2011   CULT  06/20/2024  NO GROWTH 5 DAYS Performed at [facility], [address] [place], [place] [address]    [facility] STAPHYLOCOCCUS EPIDERMIDIS 06/18/2024   LABORGA STAPHYLOCOCCUS EPIDERMIDIS 06/18/2024     Lab Results  Component Value Date   ALBUMIN  2.9 (L) 06/21/2024   ALBUMIN  3.1 (L)  06/19/2024   ALBUMIN  3.5 06/18/2024    Lab Results  Component Value Date   MG 1.9 06/23/2024   MG 2.1 06/20/2024   MG 2.2 06/19/2024   No results found for: VD25OH  No results found for: PREALBUMIN    Latest Ref Rng & Units 06/21/2024    5:00 AM 06/20/2024    6:32 AM 06/19/2024    3:44 AM  CBC EXTENDED  WBC 4.0 - 10.5 K/uL 4.7  5.1  6.2   RBC 4.22 - 5.81 MIL/uL 4.35  4.40  4.45   Hemoglobin 13.0 - 17.0 g/dL 86.8  86.8  86.5   HCT 39.0 - 52.0 % 39.2  39.2  40.2   Platelets 150 - 400 K/uL 157  157  143      There is no height or weight on file to calculate BMI.  Orders:  No orders of the defined types were placed in this encounter.  No orders of the defined types were placed in this encounter.    Procedures: No procedures performed  Clinical Data: No additional findings.  ROS:  All other systems negative, except as noted in the HPI. Review of Systems  Objective: Vital Signs: There were no vitals taken for this visit.  Specialty Comments:  No specialty comments available.  PMFS History: Patient Active Problem List   Diagnosis Date Noted   Hypokalemia 06/22/2024   Positive blood cultures 06/21/2024   Essential hypertension 06/21/2024   Lymphedema 06/20/2024   Acute renal failure superimposed on stage 3a chronic kidney disease (HCC) 06/19/2024   Liver transplant recipient [facility])    Cellulitis of left lower extremity 06/18/2024   Renal transplant, status post 05/02/2019   Immunosuppressed status 05/02/2019   Hypertensive retinopathy of both eyes 02/25/2019   Posterior vitreous detachment of left eye 02/25/2019   Hx of LASIK 02/25/2019   End stage liver disease (HCC) 11/22/2016   Hyperlipidemia 11/22/2016   PAF (paroxysmal atrial fibrillation) (HCC) 12/01/2015   Morbid obesity (HCC) 12/01/2015   Chronic gout, unspecified, without tophus (tophi) 06/28/2015   End-stage renal disease (HCC) 10/16/2013   Nephrolithiasis 10/21/2012   Benign neoplasm of  colon 06/28/2011   Past Medical History:  Diagnosis Date   A-fib [facility])    post-operative afib 12/01/15   Arthritis    spine, hips (10/17/2016)   Benign neoplasm of colon    Chronic lower back pain    End stage liver disease (HCC)    End stage renal disease (HCC) 2014   Had transplant; stopped dialysis 2014   Gout    History of blood transfusion    w/my transplants   History of kidney stones    was in the new kidney has passed   IgA nephropathy    Kidney transplant recipient    Liver transplant recipient [facility])    NASH (nonalcoholic steatohepatitis)    Neuromuscular disorder (HCC)    Obesity     No family history on file.  Past Surgical History:  Procedure Laterality Date   ANTERIOR LAT LUMBAR FUSION N/A 10/17/2016   Procedure: XLIF L3-4;  Surgeon: Donaciano Sprang, MD;  Location: [facility] OR;  Service: Orthopedics;  Laterality: N/A;  Requests for 3  hrs   AV FISTULA PLACEMENT Left ~ 2013   Av fistula removed Left ~ 2015   forearm   BACK SURGERY     CARPAL TUNNEL RELEASE Bilateral    CHOLECYSTECTOMY OPEN  2014   WITH LIVER TRANSPLANT   COLONOSCOPY     HERNIA REPAIR     LASIK     LIGATION OF ARTERIOVENOUS  FISTULA     pr ligath angioaccess av fistula   LIVER TRANSPLANT  2014   LUMBAR DISC SURGERY Left 12/01/2015   L3-L4 FAR LATERAL DISCECTOMY (WILTSE APPROACH   LUMBAR LAMINECTOMY/DECOMPRESSION MICRODISCECTOMY Left 12/01/2015   Procedure: LEFT L3-L4 FAR LATERAL DISCECTOMY (WILTSE APPROACH)     (1 LEVEL);  Surgeon: Donaciano Sprang, MD;  Location: [facility] OR;  Service: Orthopedics;  Laterality: Left;   NEPHRECTOMY RECIPIENT  2014   POSTERIOR LUMBAR FUSION  10/17/2016   Posterior fusion possible revision decompression L3-4/notes 10/17/2016   REFRACTIVE SURGERY Bilateral    UMBILICAL HERNIA REPAIR     Social History   Occupational History   Not on file  Tobacco Use   Smoking status: Never   Smokeless tobacco: Never  Substance and Sexual Activity   Alcohol use: No   Drug use: No   Sexual  activity: Yes         "

## 2024-07-20 DIAGNOSIS — Z940 Kidney transplant status: Principal | ICD-10-CM

## 2024-07-20 DIAGNOSIS — Z79899 Other long term (current) drug therapy: Principal | ICD-10-CM

## 2024-07-24 ENCOUNTER — Inpatient Hospital Stay: Admit: 2024-07-24 | Discharge: 2024-07-24 | Payer: MEDICARE

## 2024-07-27 MED ORDER — LOSARTAN 50 MG TABLET
ORAL_TABLET | Freq: Every day | ORAL | 3 refills | 90.00000 days | Status: CP
Start: 2024-07-27 — End: ?

## 2024-07-27 MED ORDER — JARDIANCE 10 MG TABLET
ORAL_TABLET | Freq: Every day | ORAL | 3 refills | 90.00000 days | Status: CP
Start: 2024-07-27 — End: ?

## 2024-08-03 DIAGNOSIS — Z940 Kidney transplant status: Principal | ICD-10-CM

## 2024-08-04 ENCOUNTER — Encounter: Payer: Self-pay | Admitting: Orthopedic Surgery

## 2024-08-04 ENCOUNTER — Ambulatory Visit: Admitting: Orthopedic Surgery

## 2024-08-04 DIAGNOSIS — I890 Lymphedema, not elsewhere classified: Secondary | ICD-10-CM

## 2024-08-05 DIAGNOSIS — Z944 Liver transplant status: Principal | ICD-10-CM

## 2024-08-05 DIAGNOSIS — K8050 Calculus of bile duct without cholangitis or cholecystitis without obstruction: Secondary | ICD-10-CM

## 2024-08-05 DIAGNOSIS — Z79899 Other long term (current) drug therapy: Secondary | ICD-10-CM

## 2024-08-06 ENCOUNTER — Ambulatory Visit: Admit: 2024-08-06 | Discharge: 2024-08-07 | Payer: MEDICARE

## 2024-08-06 DIAGNOSIS — T8649 Other complications of liver transplant: Secondary | ICD-10-CM

## 2024-08-06 DIAGNOSIS — Z86006 Personal history of melanoma in-situ: Secondary | ICD-10-CM

## 2024-08-06 DIAGNOSIS — Z944 Liver transplant status: Principal | ICD-10-CM

## 2024-08-06 DIAGNOSIS — D84821 Immunosuppression due to drug therapy (HHS-HCC): Secondary | ICD-10-CM

## 2024-08-06 DIAGNOSIS — K862 Cyst of pancreas: Secondary | ICD-10-CM

## 2024-08-06 DIAGNOSIS — K8050 Calculus of bile duct without cholangitis or cholecystitis without obstruction: Secondary | ICD-10-CM

## 2024-08-06 DIAGNOSIS — Z6841 Body Mass Index (BMI) 40.0 and over, adult: Secondary | ICD-10-CM

## 2024-08-06 DIAGNOSIS — C641 Malignant neoplasm of right kidney, except renal pelvis: Secondary | ICD-10-CM

## 2024-08-06 DIAGNOSIS — K831 Obstruction of bile duct: Secondary | ICD-10-CM

## 2024-08-06 DIAGNOSIS — Z940 Kidney transplant status: Secondary | ICD-10-CM

## 2024-11-02 ENCOUNTER — Ambulatory Visit: Admitting: Orthopedic Surgery
